# Patient Record
Sex: Female | Born: 1955
Health system: Southern US, Community
[De-identification: ages and names within clinical notes are randomized; demographics above are authoritative.]

## PROBLEM LIST (undated history)

## (undated) DIAGNOSIS — M1990 Unspecified osteoarthritis, unspecified site: Secondary | ICD-10-CM

## (undated) DIAGNOSIS — G43909 Migraine, unspecified, not intractable, without status migrainosus: Secondary | ICD-10-CM

## (undated) DIAGNOSIS — E079 Disorder of thyroid, unspecified: Secondary | ICD-10-CM

## (undated) DIAGNOSIS — T7840XA Allergy, unspecified, initial encounter: Secondary | ICD-10-CM

## (undated) DIAGNOSIS — F329 Major depressive disorder, single episode, unspecified: Secondary | ICD-10-CM

## (undated) DIAGNOSIS — F32A Depression, unspecified: Secondary | ICD-10-CM

## (undated) HISTORY — DX: Unspecified osteoarthritis, unspecified site: M19.90

## (undated) HISTORY — DX: Allergy, unspecified, initial encounter: T78.40XA

## (undated) HISTORY — DX: Disorder of thyroid, unspecified: E07.9

## (undated) HISTORY — DX: Major depressive disorder, single episode, unspecified: F32.9

## (undated) HISTORY — DX: Depression, unspecified: F32.A

---

## 2009-04-04 ENCOUNTER — Other Ambulatory Visit: Admission: RE | Admit: 2009-04-04 | Discharge: 2009-04-04 | Payer: Self-pay | Admitting: Family Medicine

## 2011-04-23 ENCOUNTER — Other Ambulatory Visit (HOSPITAL_COMMUNITY)
Admission: RE | Admit: 2011-04-23 | Discharge: 2011-04-23 | Disposition: A | Discharge status: Home / Self Care | Payer: BC Managed Care – PPO | Source: Ambulatory Visit | Attending: Family Medicine | Admitting: Family Medicine

## 2011-04-23 DIAGNOSIS — Z124 Encounter for screening for malignant neoplasm of cervix: Secondary | ICD-10-CM | POA: Insufficient documentation

## 2011-04-23 DIAGNOSIS — Z1159 Encounter for screening for other viral diseases: Secondary | ICD-10-CM | POA: Insufficient documentation

## 2013-11-02 ENCOUNTER — Telehealth: Payer: Self-pay

## 2013-11-02 ENCOUNTER — Encounter: Payer: Self-pay | Admitting: Family Medicine

## 2013-11-02 ENCOUNTER — Ambulatory Visit (INDEPENDENT_AMBULATORY_CARE_PROVIDER_SITE_OTHER): Payer: No Typology Code available for payment source | Admitting: Family Medicine

## 2013-11-02 VITALS — BP 106/70 | HR 73 | Temp 98.6°F | Resp 16 | Ht 63.5 in | Wt 167.4 lb

## 2013-11-02 DIAGNOSIS — F32A Depression, unspecified: Secondary | ICD-10-CM | POA: Insufficient documentation

## 2013-11-02 DIAGNOSIS — F3289 Other specified depressive episodes: Secondary | ICD-10-CM

## 2013-11-02 DIAGNOSIS — E039 Hypothyroidism, unspecified: Secondary | ICD-10-CM

## 2013-11-02 DIAGNOSIS — F329 Major depressive disorder, single episode, unspecified: Secondary | ICD-10-CM

## 2013-11-02 DIAGNOSIS — E7800 Pure hypercholesterolemia, unspecified: Secondary | ICD-10-CM

## 2013-11-02 LAB — COMPLETE METABOLIC PANEL WITH GFR
ALBUMIN: 4.2 g/dL (ref 3.5–5.2)
ALK PHOS: 78 U/L (ref 39–117)
ALT: 33 U/L (ref 0–35)
AST: 29 U/L (ref 0–37)
BUN: 8 mg/dL (ref 6–23)
CO2: 27 mEq/L (ref 19–32)
Calcium: 9 mg/dL (ref 8.4–10.5)
Chloride: 104 mEq/L (ref 96–112)
Creat: 0.69 mg/dL (ref 0.50–1.10)
GFR, Est African American: >89 mL/min
GFR, Est Non African American: >89 mL/min
Glucose, Bld: 109 mg/dL — ABNORMAL HIGH (ref 70–99)
POTASSIUM: 3.8 meq/L (ref 3.5–5.3)
Sodium: 139 mEq/L (ref 135–145)
Total Bilirubin: 0.4 mg/dL (ref 0.2–1.2)
Total Protein: 6.5 g/dL (ref 6.0–8.3)

## 2013-11-02 LAB — THYROID PANEL WITH TSH
FREE THYROXINE INDEX: 2.4 (ref 1.0–3.9)
T3 Uptake: 30.2 % (ref 22.5–37.0)
T4, Total: 7.8 ug/dL (ref 5.0–12.5)
TSH: 3.796 u[IU]/mL (ref 0.350–4.500)

## 2013-11-02 LAB — LDL CHOLESTEROL, DIRECT: Direct LDL: 151 mg/dL — ABNORMAL HIGH

## 2013-11-02 NOTE — Patient Instructions (Addendum)
Hypothyroidism The thyroid is a large gland located in the lower front of your neck. The thyroid gland helps control metabolism. Metabolism is how your body handles food. It controls metabolism with the hormone thyroxine. When this gland is underactive (hypothyroid), it produces too little hormone.  CAUSES These include:   Absence or destruction of thyroid tissue.  Goiter due to iodine deficiency.  Goiter due to medications.  Congenital defects (since birth).  Problems with the pituitary. This causes a lack of TSH (thyroid stimulating hormone). This hormone tells the thyroid to turn out more hormone. SYMPTOMS  Lethargy (feeling as though you have no energy)  Cold intolerance  Weight gain (in spite of normal food intake)  Dry skin  Coarse hair  Menstrual irregularity (if severe, may lead to infertility)  Slowing of thought processes Cardiac problems are also caused by insufficient amounts of thyroid hormone. Hypothyroidism in the newborn is cretinism, and is an extreme form. It is important that this form be treated adequately and immediately or it will lead rapidly to retarded physical and mental development. DIAGNOSIS  To prove hypothyroidism, your caregiver may do blood tests and ultrasound tests. Sometimes the signs are hidden. It may be necessary for your caregiver to watch this illness with blood tests either before or after diagnosis and treatment. TREATMENT  Low levels of thyroid hormone are increased by using synthetic thyroid hormone. This is a safe, effective treatment. It usually takes about four weeks to gain the full effects of the medication. After you have the full effect of the medication, it will generally take another four weeks for problems to leave. Your caregiver may start you on low doses. If you have had heart problems the dose may be gradually increased. It is generally not an emergency to get rapidly to normal. HOME CARE INSTRUCTIONS   Take your  medications as your caregiver suggests. Let your caregiver know of any medications you are taking or start taking. Your caregiver will help you with dosage schedules.  As your condition improves, your dosage needs may increase. It will be necessary to have continuing blood tests as suggested by your caregiver.  Report all suspected medication side effects to your caregiver. SEEK MEDICAL CARE IF: Seek medical care if you develop:  Sweating.  Tremulousness (tremors).  Anxiety.  Rapid weight loss.  Heat intolerance.  Emotional swings.  Diarrhea.  Weakness. SEEK IMMEDIATE MEDICAL CARE IF:  You develop chest pain, an irregular heart beat (palpitations), or a rapid heart beat. MAKE SURE YOU:   Understand these instructions.  Will watch your condition.  Will get help right away if you are not doing well or get worse. Document Released: 07/01/2005 Document Revised: 09/23/2011 Document Reviewed: 02/19/2008 [facility] Patient Information 2014 [place].    Depression, Adult Depression refers to feeling sad, low, down in the dumps, blue, gloomy, or empty. In general, there are two kinds of depression: 1. Depression that we all experience from time to time because of upsetting life experiences, including the loss of a job or the ending of a relationship (normal sadness or normal grief). This kind of depression is considered normal, is short lived, and resolves within a few days to 2 weeks. (Depression experienced after the loss of a loved one is called bereavement. Bereavement often lasts longer than 2 weeks but normally gets better with time.) 2. Clinical depression, which lasts longer than normal sadness or normal grief or interferes with your ability to function at home, at work, and in school.  It also interferes with your personal relationships. It affects almost every aspect of your life. Clinical depression is an illness. Symptoms of depression also can be caused by conditions  other than normal sadness and grief or clinical depression. Examples of these conditions are listed as follows:  Physical illness Some physical illnesses, including underactive thyroid gland (hypothyroidism), severe anemia, specific types of cancer, diabetes, uncontrolled seizures, heart and lung problems, strokes, and chronic pain are commonly associated with symptoms of depression.  Side effects of some prescription medicine In some people, certain types of prescription medicine can cause symptoms of depression.  Substance abuse Abuse of alcohol and illicit drugs can cause symptoms of depression. SYMPTOMS Symptoms of normal sadness and normal grief include the following:  Feeling sad or crying for short periods of time.  Not caring about anything (apathy).  Difficulty sleeping or sleeping too much.  No longer able to enjoy the things you used to enjoy.  Desire to be by oneself all the time (social isolation).  Lack of energy or motivation.  Difficulty concentrating or remembering.  Change in appetite or weight.  Restlessness or agitation. Symptoms of clinical depression include the same symptoms of normal sadness or normal grief and also the following symptoms:  Feeling sad or crying all the time.  Feelings of guilt or worthlessness.  Feelings of hopelessness or helplessness.  Thoughts of suicide or the desire to harm yourself (suicidal ideation).  Loss of touch with reality (psychotic symptoms). Seeing or hearing things that are not real (hallucinations) or having false beliefs about your life or the people around you (delusions and paranoia). DIAGNOSIS  The diagnosis of clinical depression usually is based on the severity and duration of the symptoms. Your caregiver also will ask you questions about your medical history and substance use to find out if physical illness, use of prescription medicine, or substance abuse is causing your depression. Your caregiver also may  order blood tests. TREATMENT  Typically, normal sadness and normal grief do not require treatment. However, sometimes antidepressant medicine is prescribed for bereavement to ease the depressive symptoms until they resolve. The treatment for clinical depression depends on the severity of your symptoms but typically includes antidepressant medicine, counseling with a mental health professional, or a combination of both. Your caregiver will help to determine what treatment is best for you. Depression caused by physical illness usually goes away with appropriate medical treatment of the illness. If prescription medicine is causing depression, talk with your caregiver about stopping the medicine, decreasing the dose, or substituting another medicine. Depression caused by abuse of alcohol or illicit drugs abuse goes away with abstinence from these substances. Some adults need professional help in order to stop drinking or using drugs. SEEK IMMEDIATE CARE IF:  You have thoughts about hurting yourself or others.  You lose touch with reality (have psychotic symptoms).  You are taking medicine for depression and have a serious side effect. FOR MORE INFORMATION National Alliance on Mental Illness: www.nami.[employer]: https://carter.com/ Document Released: 06/28/2000 Document Revised: 12/31/2011 Document Reviewed: 09/30/2011 [facility] Patient Information 2014 [place].

## 2013-11-02 NOTE — Telephone Encounter (Signed)
PT CALLED AND STATES SHE WANTS HER MEDICATIONS TO SET UP UNDER A MAIL ORDER COMPANY CALLED "[facility]" STATES THEIR PHONE NUMBER IS [phone] AND NEEDS [place] TO SEND TO THEM AN RX FOR A 3 MONTH SUPPLY FOR LEVOTHYROXINE AND PAROXETINE.  PT CAN BE REACHED AT [phone]

## 2013-11-02 NOTE — Progress Notes (Signed)
S:  This 58 y.o. Cauc female is here to establish care; change of insurer necessitates change from [facility] where she has been a pt for ~ 5 years. Last visit there was summer 2014; last CPE/PAP was about 1- 1.5 years ago. Chronic medical issues are Hypothyroidism and Depression. Pt is compliant w/ medications w/o adverse effects. She takes Levothyroxine every morning before breakfast. Paroxetine, which pt has been taking since mid-1990s, is effective.   She is divorced and has a busy life; she and her sister share a home. Pt has no children but does have a beautiful black cat.  PMHx, Surg Hx, Soc and Fam Hx reviewed.  MEDICATIONS reconciled.  Pt will be using a mail order prescription plan; she will contact us w/ that information once she has activated the plan.  ROS: As per HPI; negative for anorexia, fatigue, abnormal weight loss, vision disturbances, CP or tightness, palpitations, SOB or DOE, cough,edema, GI problems; myalgias, rashes or hair loss, HA, dizziness, weakness or syncope.  O: Filed Vitals:   11/02/13 1409  BP: 106/70  Pulse: 73  Temp: 98.6 F (37 C)  Resp: 16   GEN: In NAD: WN,WD. HENT: [place]/AT; EOMI w/ clear conj/sclerae. Wears corrective lenses. EACs/canals/TMs/oroph normal. NECK: Supple w/o LAN or TMG. C-spine NT, no muscle spasms or bony tenderness. COR: RRR; normal S1 and S2. No m/g/r. LUNGS: CTA; Normal resp rate and effort. SKIN: W&D; intact w/o diaphoresis, erythema or pallor. MS: MAEs; no deformities or abnormal tone. No c/c/e. NEURO: A&O x 3; CNs intact. DTRs- 2-3+/= and brisk. Nonfocal.  A/P: Unspecified hypothyroidism - Continue current dose of Levothyroxine pending lab results. Plan: Thyroid Panel With TSH, COMPLETE METABOLIC PANEL WITH GFR  Depressive disorder, not elsewhere classified - Continue current dose of Paroxetine. Plan: Thyroid Panel With TSH  Elevated LDL cholesterol level - Plan: LDL Cholesterol, Direct

## 2013-11-03 NOTE — Telephone Encounter (Signed)
I do not see Sheri Adkins mail order coming up on our pharmacy list.  Please find a fax number so we can send rxs.  Ok to send 90 days of levothyroxine and paroxetine

## 2013-11-03 NOTE — Telephone Encounter (Signed)
Pamala Hurry or Clarise Cruz, I'm unable to find it too. Do you mind please doing this for me in the morning. Thanks

## 2013-11-04 MED ORDER — LEVOTHYROXINE SODIUM 88 MCG PO TABS: 88.0000 ug | ORAL_TABLET | Freq: Every day | ORAL | Status: DC

## 2013-11-04 MED ORDER — PAROXETINE HCL 40 MG PO TABS: 40.0000 mg | ORAL_TABLET | Freq: Every day | ORAL | Status: DC

## 2013-11-04 NOTE — Telephone Encounter (Signed)
Called # and checked ins card, both of which clarified [facility] is the mail order that [employer] [facility] uses. Verified doses w/pt and advised I sent in Rxs to [facility].

## 2013-11-04 NOTE — Progress Notes (Signed)
Quick Note:  Please advise pt regarding following labs...  Thyroid function tests are normal.  Your recent labs show a blood sugar slightly above normal. At your next visit, it would be good to screen for Diabetes. Many people have it and are not aware of that; it is a simple in-office screening test. Your LDL "bad" cholesterol is elevated also. We will need to do a complete fasting lipid panel at your next visit. Focus on healthy food choices; eliminate processed and fried foods. Eat lean meats (remove the skin from chicken ) and fish, fruits and vegetables and whole grains. Stay active with physical activity and regular exercise.  Consider taking an Omega-3 Fish Oil supplement 1200 mg 1 capsule daily. Take this separate from other solid tablets and capsules. There are several good brands available without a prescription Petra Kuba Made is one brand).  Copy to pt. ______

## 2013-11-16 ENCOUNTER — Encounter: Payer: Self-pay | Admitting: Radiology

## 2014-02-03 ENCOUNTER — Ambulatory Visit (INDEPENDENT_AMBULATORY_CARE_PROVIDER_SITE_OTHER): Payer: No Typology Code available for payment source | Admitting: Family Medicine

## 2014-02-03 ENCOUNTER — Encounter: Payer: Self-pay | Admitting: Family Medicine

## 2014-02-03 ENCOUNTER — Ambulatory Visit (INDEPENDENT_AMBULATORY_CARE_PROVIDER_SITE_OTHER): Payer: No Typology Code available for payment source

## 2014-02-03 VITALS — BP 100/62 | HR 70 | Temp 98.2°F | Resp 16 | Ht 63.75 in | Wt 163.4 lb

## 2014-02-03 DIAGNOSIS — M62838 Other muscle spasm: Secondary | ICD-10-CM

## 2014-02-03 DIAGNOSIS — R51 Headache: Secondary | ICD-10-CM

## 2014-02-03 DIAGNOSIS — R519 Headache, unspecified: Secondary | ICD-10-CM

## 2014-02-03 MED ORDER — MELOXICAM 15 MG PO TABS: ORAL_TABLET | ORAL | Status: DC

## 2014-02-03 MED ORDER — GABAPENTIN 100 MG PO CAPS: 100.0000 mg | ORAL_CAPSULE | Freq: Three times a day (TID) | ORAL | Status: DC

## 2014-02-03 NOTE — Progress Notes (Signed)
Subjective:    Patient ID: Sheri Adkins, female    DOB: 09/25/1955, 58 y.o.   MRN: [id number]  HPI  This 58 y.o. Cauc female present w/ acute onset of R facial pain, described as "hot poker", burning or "like when dental anesthetic is used". She has not dental pain or pressure. Mild sore throat w/o dysphagia, ear pain or tinnitus, eye pain or tearing, vision disturbance, fever/chills or n/v/d. Pt reports stiffness in neck associated w/ prolonged work days on the computer. She c/o R-sided neck discomfort radiating down R arm w/ paresthesias.  Massage therapy last week relieved much of the pain. Intermittent facial discomfort only occurs when sitting upright; duration- 5 minutes or less.   Patient Active Problem List   Diagnosis Date Noted  . Unspecified hypothyroidism 11/02/2013  . Depressive disorder, not elsewhere classified 11/02/2013  . Elevated LDL cholesterol level 11/02/2013    Prior to Admission medications   Medication Sig Start Date End Date Taking? Authorizing Provider  Calcium Carbonate-Vitamin D (CALCIUM + D PO) Take by mouth daily.   Yes Historical Provider, MD  levothyroxine (SYNTHROID, LEVOTHROID) 88 MCG tablet Take 1 tablet (88 mcg total) by mouth daily. 11/04/13  Yes Barton Fanny, MD  Multiple Vitamin (MULTIVITAMIN) tablet Take 1 tablet by mouth daily.   Yes Historical Provider, MD  PARoxetine (PAXIL) 40 MG tablet Take 1 tablet (40 mg total) by mouth daily. 11/04/13  Yes Barton Fanny, MD  gabapentin (NEURONTIN) 100 MG capsule Take 1 capsule (100 mg total) by mouth 3 (three) times daily. 02/03/14   Barton Fanny, MD  meloxicam [facility]) 15 MG tablet Take 1 tablet once a day with largest meal. 02/03/14   Barton Fanny, MD   Allergies  Allergen Reactions  . Penicillins     Rash  . Sulfa Antibiotics     Rash    PMHx, Surg Hx, Soc and Fam HXx reviewed.   Review of Systems As per HPI.     Objective:   Physical Exam  Nursing note and vitals  reviewed. Constitutional: She is oriented to person, place, and time. Vital signs are normal. She appears well-developed and well-nourished. No distress.  HENT:  Head: Normocephalic and atraumatic.  Right Ear: Hearing, tympanic membrane, external ear and ear canal normal.  Left Ear: Hearing, tympanic membrane, external ear and ear canal normal.  Nose: Nose normal. No nasal deformity or septal deviation.  Mouth/Throat: Uvula is midline and mucous membranes are normal. No trismus in the jaw. Normal dentition. No dental abscesses, uvula swelling or dental caries. Posterior oropharyngeal erythema present. No oropharyngeal exudate or posterior oropharyngeal edema.  Eyes: Conjunctivae, EOM and lids are normal. Pupils are equal, round, and reactive to light. No scleral icterus.  Neck: Trachea normal. Neck supple. No JVD present. Muscular tenderness present. No spinous process tenderness present. Carotid bruit is not present. Decreased range of motion present. No mass and no thyromegaly present.  Cardiovascular: Normal rate, regular rhythm, S1 normal and S2 normal.   No extrasystoles are present. PMI is displaced.  Exam reveals no gallop and no friction rub.   No murmur heard. Pulmonary/Chest: Effort normal and breath sounds normal. No respiratory distress.  Musculoskeletal:       Cervical back: She exhibits decreased range of motion, tenderness and spasm. She exhibits no bony tenderness, no edema, no deformity and no pain.  Lymphadenopathy:       Head (right side): No submental, no submandibular, no tonsillar, no preauricular, no posterior  auricular and no occipital adenopathy present.       Head (left side): No submental, no submandibular, no tonsillar, no preauricular, no posterior auricular and no occipital adenopathy present.    She has no cervical adenopathy.       Right: No supraclavicular adenopathy present.       Left: No supraclavicular adenopathy present.  Neurological: She is alert and  oriented to person, place, and time. She has normal strength. She displays no atrophy and no tremor. No cranial nerve deficit or sensory deficit. She exhibits normal muscle tone. Coordination and gait normal.  Reflex Scores:      Tricep reflexes are 2+ on the right side and 2+ on the left side.      Bicep reflexes are 2+ on the right side and 2+ on the left side.      Brachioradialis reflexes are 2+ on the right side and 2+ on the left side.      Patellar reflexes are 2+ on the right side and 2+ on the left side. Skin: Skin is warm, dry and intact. No lesion and no rash noted. She is not diaphoretic. No cyanosis or erythema. No pallor.  Psychiatric: She has a normal mood and affect. Her speech is normal and behavior is normal. Judgment and thought content normal. Cognition and memory are normal.     [facility] reading (PRIMARY) by  Dr. Leward Quan:  Cervical spine- mild early degenerative changes at lower levels w/ narrowed disc spaces. No fracture or dislocation.     Assessment & Plan:  Right facial pain - Neuralgia; trial gabapentin 100 mg- start w/ 1 capsule at bedtime for 1 week then increase to 1 capsule twice daily until follow-up. Plan: DG Cervical Spine 2 or 3 views  Muscle spasms of head or neck - Trial Meloxicam 15 mg 1 tablet daily. Plan: DG Cervical Spine 2 or 3 views

## 2014-02-03 NOTE — Patient Instructions (Signed)
Muscle Cramps and Spasms Muscle cramps and spasms occur when a muscle or muscles tighten and you have no control over this tightening (involuntary muscle contraction). They are a common problem and can develop in any muscle. The most common place is in the calf muscles of the leg. Both muscle cramps and muscle spasms are involuntary muscle contractions, but they also have differences:   Muscle cramps are sporadic and painful. They may last a few seconds to a quarter of an hour. Muscle cramps are often more forceful and last longer than muscle spasms.  Muscle spasms may or may not be painful. They may also last just a few seconds or much longer. CAUSES  It is uncommon for cramps or spasms to be due to a serious underlying problem. In many cases, the cause of cramps or spasms is unknown. Some common causes are:   Overexertion.   Overuse from repetitive motions (doing the same thing over and over).   Remaining in a certain position for a long period of time.   Improper preparation, form, or technique while performing a sport or activity.   Dehydration.   Injury.   Side effects of some medicines.   Abnormally low levels of the salts and ions in your blood (electrolytes), especially potassium and calcium. This could happen if you are taking water pills (diuretics) or you are pregnant.  Some underlying medical problems can make it more likely to develop cramps or spasms. These include, but are not limited to:   Diabetes.   Parkinson disease.   Hormone disorders, such as thyroid problems.   Alcohol abuse.   Diseases specific to muscles, joints, and bones.   Blood vessel disease where not enough blood is getting to the muscles.  HOME CARE INSTRUCTIONS   Stay well hydrated. Drink enough water and fluids to keep your urine clear or pale yellow.  It may be helpful to massage, stretch, and relax the affected muscle.  For tight or tense muscles, use a warm towel, heating  pad, or hot shower water directed to the affected area.  If you are sore or have pain after a cramp or spasm, applying ice to the affected area may relieve discomfort.  Put ice in a plastic bag.  Place a towel between your skin and the bag.  Leave the ice on for 15-20 minutes, 03-04 times a day.  Medicines used to treat a known cause of cramps or spasms may help reduce their frequency or severity. Only take over-the-counter or prescription medicines as directed by your caregiver. SEEK MEDICAL CARE IF:  Your cramps or spasms get more severe, more frequent, or do not improve over time.  MAKE SURE YOU:   Understand these instructions.  Will watch your condition.  Will get help right away if you are not doing well or get worse. Document Released: 12/21/2001 Document Revised: 10/26/2012 Document Reviewed: 06/17/2012 [facility] Patient Information 2015 [place], [place]. This information is not intended to replace advice given to you by your health care provider. Make sure you discuss any questions you have with your health care provider.   Your can apply moist heat to the back of your neck and to side of face to see if that helps relief discomfort.  Also, try topical muscle relaxant (like ARNICA CREAM) which you can purchase over-the-counter.

## 2014-03-08 ENCOUNTER — Ambulatory Visit: Payer: No Typology Code available for payment source | Admitting: Family Medicine

## 2014-03-11 ENCOUNTER — Telehealth: Payer: Self-pay

## 2014-03-11 ENCOUNTER — Encounter: Payer: Self-pay | Admitting: Family Medicine

## 2014-03-11 ENCOUNTER — Ambulatory Visit (INDEPENDENT_AMBULATORY_CARE_PROVIDER_SITE_OTHER): Payer: No Typology Code available for payment source | Admitting: Family Medicine

## 2014-03-11 VITALS — BP 108/69 | HR 84 | Temp 98.3°F | Resp 16 | Ht 64.0 in | Wt 166.0 lb

## 2014-03-11 DIAGNOSIS — M47812 Spondylosis without myelopathy or radiculopathy, cervical region: Secondary | ICD-10-CM | POA: Insufficient documentation

## 2014-03-11 DIAGNOSIS — G43809 Other migraine, not intractable, without status migrainosus: Secondary | ICD-10-CM | POA: Insufficient documentation

## 2014-03-11 MED ORDER — ALMOTRIPTAN MALATE 6.25 MG PO TABS: 6.2500 mg | ORAL_TABLET | ORAL | Status: DC | PRN

## 2014-03-11 MED ORDER — RIZATRIPTAN BENZOATE 10 MG PO TBDP: 10.0000 mg | ORAL_TABLET | ORAL | Status: DC | PRN

## 2014-03-11 NOTE — Telephone Encounter (Signed)
I have prescribed Axert (generic) which appears to be covered by insurer. Pt should let me know if she has any problems with this medication ( ineffective or adverse effects).

## 2014-03-11 NOTE — Telephone Encounter (Deleted)
I have decided against prescribing medication for migraine (Axert is on the formulary) but use with Paroxetine could be a problem.  I am prescribing Fioricet which pt can use if she has a migraine. We will need to revisit this topic at her next visit. She has been advised to increase Gabapentin bedtime dose to 2 capsules.

## 2014-03-11 NOTE — Telephone Encounter (Signed)
[place] - Pt was seen this morning and she has just received a call from [employer] that that do not carry the rizatripton.  Can we call her in something different? [phone]

## 2014-03-11 NOTE — Patient Instructions (Signed)
Cervical spondylosis- Continue taking Gabapentin; take 1 capsule in the morning and increase bedtime dose to 2 capsules. If you have any adverse medication reaction with the increased bedtime dose, then go back to 1 capsule at bedtime. After you have established your [facility] practice, we can consider discontinuing the Gabapentin. We discussed alcohol use (wine) while using this medication. Continue taking Meloxicam as directed. Moist heat applied to neck area can be helpful also.  I have prescribed Maxalt- generic- for your infrequent migraines. Try to avoid migraine triggers.   Degenerative Disk Disease Degenerative disk disease is a condition caused by the changes that occur in the cushions of the backbone (spinal disks) as you grow older. Spinal disks are soft and compressible disks located between the bones of the spine (vertebrae). They act like shock absorbers. Degenerative disk disease can affect the whole spine. However, the neck and lower back are most commonly affected. Many changes can occur in the spinal disks with aging, such as:  The spinal disks may dry and shrink.  Small tears may occur in the tough, outer covering of the disk (annulus).  The disk space may become smaller due to loss of water.  Abnormal growths in the bone (spurs) may occur. This can put pressure on the nerve roots exiting the spinal canal, causing pain.  The spinal canal may become narrowed. CAUSES  Degenerative disk disease is a condition caused by the changes that occur in the spinal disks with aging. The exact cause is not known, but there is a genetic basis for many patients. Degenerative changes can occur due to loss of fluid in the disk. This makes the disk thinner and reduces the space between the backbones. Small cracks can develop in the outer layer of the disk. This can lead to the breakdown of the disk. You are more likely to get degenerative disk disease if you are overweight. Smoking cigarettes and doing  heavy work such as weightlifting can also increase your risk of this condition. Degenerative changes can start after a sudden injury. Growth of bone spurs can compress the nerve roots and cause pain.  SYMPTOMS  The symptoms vary from person to person. Some people may have no pain, while others have severe pain. The pain may be so severe that it can limit your activities. The location of the pain depends on the part of your backbone that is affected. You will have neck or arm pain if a disk in the neck area is affected. You will have pain in your back, buttocks, or legs if a disk in the lower back is affected. The pain becomes worse while bending, reaching up, or with twisting movements. The pain may start gradually and then get worse as time passes. It may also start after a major or minor injury. You may feel numbness or tingling in the arms or legs.  DIAGNOSIS  Your caregiver will ask you about your symptoms and about activities or habits that may cause the pain. He or she may also ask about any injuries, diseases, or treatments you have had earlier. Your caregiver will examine you to check for the range of movement that is possible in the affected area, to check for strength in your extremities, and to check for sensation in the areas of the arms and legs supplied by different nerve roots. An X-ray of the spine may be taken. Your caregiver may suggest other imaging tests, such as magnetic resonance imaging (MRI), if needed.  TREATMENT  Treatment includes  rest, modifying your activities, and applying ice and heat. Your caregiver may prescribe medicines to reduce your pain and may ask you to do some exercises to strengthen your back. In some cases, you may need surgery. You and your caregiver will decide on the treatment that is best for you. HOME CARE INSTRUCTIONS   Follow proper lifting and walking techniques as advised by your caregiver.  Maintain good posture.  Exercise regularly as  advised.  Perform relaxation exercises.  Change your sitting, standing, and sleeping habits as advised. Change positions frequently.  Lose weight as advised.  Stop smoking if you smoke.  Wear supportive footwear. SEEK MEDICAL CARE IF:  Your pain does not go away within 1 to 4 weeks. SEEK IMMEDIATE MEDICAL CARE IF:   Your pain is severe.  You notice weakness in your arms, hands, or legs.  You begin to lose control of your bladder or bowel movements. MAKE SURE YOU:   Understand these instructions.  Will watch your condition.  Will get help right away if you are not doing well or get worse. Document Released: 04/28/2007 Document Revised: 09/23/2011 Document Reviewed: 11/02/2013 [facility] Patient Information 2015 [place], [place]. This information is not intended to replace advice given to you by your health care provider. Make sure you discuss any questions you have with your health care provider.

## 2014-03-13 ENCOUNTER — Encounter: Payer: Self-pay | Admitting: Family Medicine

## 2014-03-13 NOTE — Progress Notes (Signed)
Subjective:    Patient ID: Sheri Adkins, female    DOB: [birth year], 58 y.o.   MRN: [id number]  HPI  This 58 y.o. Cauc female returns for re-evaluation of R-sided facial pain. She has less pain since starting Gabapentin 1 capsule bid and Meloxicam. Facial pain infrequently awakens pt from sleep but she reports mild sleep disorder unassociated w/ current problem. No aadverse medication effects w/ Gabapentin. She reports migraine HA once she started taking Meloxicam. Migraine started in back of neck but progressed to R side of head; associated symptoms included nausea and mild vision disturbance. HA was relieved w/ OTC medication and rest. Medication prescribed in the past for migraine included Maxalt which was effective.   Patient Active Problem List   Diagnosis Date Noted  . Cervical spondylosis 03/11/2014  . Migraine variants, not intractable 03/11/2014  . Unspecified hypothyroidism 11/02/2013  . Depressive disorder, not elsewhere classified 11/02/2013  . Elevated LDL cholesterol level 11/02/2013   Prior to Admission medications   Medication Sig Start Date End Date Taking? Authorizing Provider  Calcium Carbonate-Vitamin D (CALCIUM + D PO) Take by mouth daily.   Yes Historical Provider, MD  gabapentin (NEURONTIN) 100 MG capsule Take 1 capsule (100 mg total) by mouth 3 (three) times daily. 02/03/14  Yes Barton Fanny, MD  levothyroxine (SYNTHROID, LEVOTHROID) 88 MCG tablet Take 1 tablet (88 mcg total) by mouth daily. 11/04/13  Yes Barton Fanny, MD  meloxicam [facility]) 15 MG tablet Take 1 tablet once a day with largest meal. 02/03/14  Yes Barton Fanny, MD  Multiple Vitamin (MULTIVITAMIN) tablet Take 1 tablet by mouth daily.   Yes Historical Provider, MD  PARoxetine (PAXIL) 40 MG tablet Take 1 tablet (40 mg total) by mouth daily. 11/04/13  Yes Barton Fanny, MD    History   Social History  . Marital Status: Single    Spouse Name: N/A    Number of Children: N/A  .  Years of Education: N/A   Occupational History  . Not on file.   Social History Main Topics  . Smoking status: Never Smoker   . Smokeless tobacco: Not on file  . Alcohol Use: Yes     Comment: 2 a week  . Drug Use: No  . Sexual Activity: Not on file   Other Topics Concern  . Not on file   Social History Narrative  . No narrative on file    Family History  Problem Relation Age of Onset  . Hypertension Mother   . Stroke Mother   . Cancer Sister     ovarian cancer  . Cancer Paternal Grandmother     breast  . Heart disease Paternal Grandfather     Review of Systems  Constitutional: Negative.   Eyes: Negative.   Respiratory: Negative for chest tightness and shortness of breath.   Cardiovascular: Negative for chest pain and palpitations.  Endocrine: Negative.   Musculoskeletal: Negative.   Skin: Negative.   Neurological: Positive for headaches. Negative for dizziness, tremors, seizures, syncope, facial asymmetry, speech difficulty and numbness.  Psychiatric/Behavioral: Positive for sleep disturbance.      Objective:   Physical Exam  Nursing note and vitals reviewed. Constitutional: She is oriented to person, place, and time. Vital signs are normal. She appears well-developed and well-nourished. No distress.  HENT:  Head: Normocephalic and atraumatic.  Right Ear: Hearing and external ear normal.  Left Ear: Hearing and external ear normal.  Nose: Nose normal. No nasal deformity or septal  deviation.  Mouth/Throat: Uvula is midline and mucous membranes are normal.  Eyes: Conjunctivae, EOM and lids are normal. No scleral icterus.  Neck: Full passive range of motion without pain. Neck supple. Muscular tenderness present. No spinous process tenderness present. No mass and no thyromegaly present.  Cardiovascular: Normal rate and regular rhythm.   Pulmonary/Chest: Effort normal. No respiratory distress.  Musculoskeletal: She exhibits no edema.       Cervical back: She  exhibits decreased range of motion, tenderness and spasm. She exhibits no bony tenderness, no swelling, no deformity and no pain.  Neurological: She is alert and oriented to person, place, and time. She has normal strength. No cranial nerve deficit or sensory deficit. Coordination and gait normal.  Skin: Skin is warm and dry. She is not diaphoretic. No erythema. No pallor.  Psychiatric: She has a normal mood and affect. Her behavior is normal. Thought content normal.    I reviewed C-spine plain films (02/03/2014) with pt; degenerative disc disease at C5-6 and C6-7 clearly visible.     Assessment & Plan:  Cervical spondylosis- Continue Gabapentin but increase bedtime dose to 2 capsules. Advised to replace pillows if she has old pillows that are not providing adequate head/nack support. She also states she needs to replace her mattress.   Migraine variants, not intractable- RX: Axert 6.25 mg  Use as directed.

## 2014-05-04 ENCOUNTER — Encounter: Payer: No Typology Code available for payment source | Admitting: Family Medicine

## 2014-05-12 ENCOUNTER — Encounter: Payer: Self-pay | Admitting: Family Medicine

## 2014-05-12 ENCOUNTER — Ambulatory Visit (INDEPENDENT_AMBULATORY_CARE_PROVIDER_SITE_OTHER): Payer: No Typology Code available for payment source | Admitting: Family Medicine

## 2014-05-12 VITALS — BP 108/60 | HR 71 | Temp 98.8°F | Resp 16 | Ht 64.0 in | Wt 166.2 lb

## 2014-05-12 DIAGNOSIS — M62838 Other muscle spasm: Secondary | ICD-10-CM

## 2014-05-12 DIAGNOSIS — M6248 Contracture of muscle, other site: Secondary | ICD-10-CM

## 2014-05-12 DIAGNOSIS — E7800 Pure hypercholesterolemia, unspecified: Secondary | ICD-10-CM

## 2014-05-12 DIAGNOSIS — F32A Depression, unspecified: Secondary | ICD-10-CM

## 2014-05-12 DIAGNOSIS — F329 Major depressive disorder, single episode, unspecified: Secondary | ICD-10-CM

## 2014-05-12 DIAGNOSIS — E039 Hypothyroidism, unspecified: Secondary | ICD-10-CM

## 2014-05-12 DIAGNOSIS — G43809 Other migraine, not intractable, without status migrainosus: Secondary | ICD-10-CM

## 2014-05-12 DIAGNOSIS — M47812 Spondylosis without myelopathy or radiculopathy, cervical region: Secondary | ICD-10-CM

## 2014-05-12 LAB — COMPLETE METABOLIC PANEL WITH GFR
ALK PHOS: 61 U/L (ref 39–117)
ALT: 22 U/L (ref 0–35)
AST: 24 U/L (ref 0–37)
Albumin: 4.3 g/dL (ref 3.5–5.2)
BUN: 9 mg/dL (ref 6–23)
CO2: 24 meq/L (ref 19–32)
CREATININE: 0.76 mg/dL (ref 0.50–1.10)
Calcium: 8.7 mg/dL (ref 8.4–10.5)
Chloride: 105 mEq/L (ref 96–112)
GFR, Est Non African American: 87 mL/min
GLUCOSE: 84 mg/dL (ref 70–99)
Potassium: 3.9 mEq/L (ref 3.5–5.3)
Sodium: 137 mEq/L (ref 135–145)
Total Bilirubin: 0.7 mg/dL (ref 0.2–1.2)
Total Protein: 6.6 g/dL (ref 6.0–8.3)

## 2014-05-12 LAB — CBC WITH DIFFERENTIAL/PLATELET
Basophils Absolute: 0 10*3/uL (ref 0.0–0.1)
Basophils Relative: 1 % (ref 0–1)
EOS PCT: 5 % (ref 0–5)
Eosinophils Absolute: 0.2 10*3/uL (ref 0.0–0.7)
HCT: 35.8 % — ABNORMAL LOW (ref 36.0–46.0)
Hemoglobin: 12.4 g/dL (ref 12.0–15.0)
Lymphocytes Relative: 41 % (ref 12–46)
Lymphs Abs: 1.8 10*3/uL (ref 0.7–4.0)
MCH: 30.9 pg (ref 26.0–34.0)
MCHC: 34.6 g/dL (ref 30.0–36.0)
MCV: 89.3 fL (ref 78.0–100.0)
MONOS PCT: 10 % (ref 3–12)
Monocytes Absolute: 0.4 10*3/uL (ref 0.1–1.0)
Neutro Abs: 1.8 10*3/uL (ref 1.7–7.7)
Neutrophils Relative %: 43 % (ref 43–77)
PLATELETS: 237 10*3/uL (ref 150–400)
RBC: 4.01 MIL/uL (ref 3.87–5.11)
RDW: 13.4 % (ref 11.5–15.5)
WBC: 4.3 10*3/uL (ref 4.0–10.5)

## 2014-05-12 LAB — LIPID PANEL
CHOLESTEROL: 233 mg/dL — AB (ref 0–200)
HDL: 59 mg/dL (ref >39)
LDL Cholesterol: 150 mg/dL — ABNORMAL HIGH (ref 0–99)
TRIGLYCERIDES: 121 mg/dL (ref <150)
Total CHOL/HDL Ratio: 3.9 Ratio
VLDL: 24 mg/dL (ref 0–40)

## 2014-05-12 LAB — TSH: TSH: 3.492 u[IU]/mL (ref 0.350–4.500)

## 2014-05-12 MED ORDER — PAROXETINE HCL 40 MG PO TABS: 40.0000 mg | ORAL_TABLET | Freq: Every day | ORAL | Status: DC

## 2014-05-12 MED ORDER — ALMOTRIPTAN MALATE 6.25 MG PO TABS: 6.2500 mg | ORAL_TABLET | ORAL | Status: DC | PRN

## 2014-05-12 MED ORDER — MELOXICAM 15 MG PO TABS: ORAL_TABLET | ORAL | Status: DC

## 2014-05-12 MED ORDER — LEVOTHYROXINE SODIUM 88 MCG PO TABS: 88.0000 ug | ORAL_TABLET | Freq: Every day | ORAL | Status: DC

## 2014-05-12 MED ORDER — GABAPENTIN 100 MG PO CAPS: 100.0000 mg | ORAL_CAPSULE | Freq: Three times a day (TID) | ORAL | Status: DC

## 2014-05-12 NOTE — Progress Notes (Signed)
Subjective:    Patient ID: Sheri Adkins, female    DOB: [birth year], 58 y.o.   MRN: [id number]  HPI  Mrs. Esperanza is a 58 y.o. female with a pmh of migraine and cervical spondylosis.   Migraines - reports that she rarely gets them now, used to be much more frequent, now only about twice a year. Patient experiences aura, n/v, tries to use ibuprofen OTC, which helps but otherwise she will try to sleep migraines off. Of note, patient has not been able to fill her prescription through mail order, she will be switching to [facility] in November. Would like to have a refill of her almotriptan.   Hypothyroidism - managed with Synthroid, reports compliance. Denies adverse effects. Denies weight gain, constipation, dry skin, hair thinning, depressed mood.   Depression - managed with Paxil, started this medication 4 years ago, reports compliance. Denies adverse effects. Works full time with [employer], job can be demanding but she feels she copes well and welcomes staying busy with her job. Denies depressed mood, decreased energy, concentration or appetite, sleep disturbances, psychomotor agitation or retardation, guilt, SI or HI. Denies smoking, has 1 drink of alcohol at dinner.  Cervical spondylosis - managed with meloxicam and gabapentin, denies adverse effects. Patient used to experience severe spasms in neck causing pain and facial twitching, tingling. She reports that this is completely resolved. At times, her work can make her a little sore but she does neck exercises, heat pads with significant relief. She also does yoga regularly and together with gabapentin feels that this is resolved for her.  Denies any other aggravating or relieving factors, no other questions or concerns.   Prior to Admission medications   Medication Sig Start Date End Date Taking? Authorizing Provider  Calcium Carbonate-Vitamin D (CALCIUM + D PO) Take by mouth daily.   Yes Historical Provider, MD  gabapentin (NEURONTIN)  100 MG capsule Take 1 capsule (100 mg total) by mouth 3 (three) times daily. 05/12/14  Yes Jaynee Eagles, PA-C  levothyroxine (SYNTHROID, LEVOTHROID) 88 MCG tablet Take 1 tablet (88 mcg total) by mouth daily. 05/12/14  Yes Jaynee Eagles, PA-C  meloxicam (MOBIC) 15 MG tablet Take 1 tablet once a day with largest meal. 05/12/14  Yes Jaynee Eagles, PA-C  Multiple Vitamin (MULTIVITAMIN) tablet Take 1 tablet by mouth daily.   Yes Historical Provider, MD  PARoxetine (PAXIL) 40 MG tablet Take 1 tablet (40 mg total) by mouth daily. 05/12/14  Yes Jaynee Eagles, PA-C  almotriptan (AXERT) 6.25 MG tablet Take 1 tablet (6.25 mg total) by mouth as needed for migraine. may repeat in 2 hours if needed 05/12/14   Barton Fanny, MD    Allergies  Allergen Reactions  . Penicillins     Rash  . Sulfa Antibiotics     Rash    Past Medical History  Diagnosis Date  . Depression   . Thyroid disease     History reviewed. No pertinent past surgical history.   Family History  Problem Relation Age of Onset  . Hypertension Mother   . Stroke Mother   . Cancer Sister     ovarian cancer  . Cancer Paternal Grandmother     breast  . Heart disease Paternal Grandfather     Review of Systems As in subjective.    Objective:   Physical Exam  Constitutional: She is oriented to person, place, and time. She appears well-developed and well-nourished. No distress.  BP 108/60  Pulse 71  Temp(Src)  98.8 F (37.1 C) (Oral)  Resp 16  Ht 5\' 4"  (1.626 m)  Wt 166 lb 3.2 oz (75.388 kg)  BMI 28.51 kg/m2  SpO2 95%   HENT:  Head: Normocephalic and atraumatic.  Neck: Normal range of motion. Neck supple. No thyromegaly present.  Cardiovascular: Normal rate, regular rhythm, normal heart sounds and intact distal pulses.  Exam reveals no gallop and no friction rub.   No murmur heard. Pulmonary/Chest: Effort normal and breath sounds normal. No respiratory distress. She has no wheezes.  Abdominal: Soft. Bowel sounds are normal.  She exhibits no mass. There is no tenderness.  Musculoskeletal: Normal range of motion. She exhibits no edema and no tenderness.  Lymphadenopathy:    She has no cervical adenopathy.  Neurological: She is alert and oriented to person, place, and time. She has normal reflexes.  Skin: Skin is warm and dry. No rash noted. She is not diaphoretic. No erythema.  Psychiatric: She has a normal mood and affect. Her behavior is normal.      Assessment & Plan:   1. Migraine variants, not intractable Stable, refill almotriptan, follow up as needed - COMPLETE METABOLIC PANEL WITH GFR  2. Cervical spondylosis 3. Muscle spasms of head or neck Stable, continue gabapentin and meloxicam, follow up as needed  4. Hypothyroidism, unspecified hypothyroidism type Stable, continue synthroid, repeat TSH today, follow up in 07/2014 - CBC with Differential - COMPLETE METABOLIC PANEL WITH GFR - TSH  5. Depression Stable, continue Paxil, labs today, follow up in 07/2014 - TSH  6. Elevated LDL cholesterol level Stable, recheck lipids today, advised healthy diet and continue exercise, follow up as above - Lipid panel   Jaynee Eagles, PA-C [facility] [phone] 05/12/2014 9:55 AM

## 2014-05-12 NOTE — Patient Instructions (Addendum)
It was good to see you today and good to know that you are doing well. I will see you in January 2016 for your complete physical. Your lab results will be available within a few days.  Below is some information about MEDITERRANEAN DIET that is a great guide for healthy lifestyle.      Mediterranean Diet  Why follow it? Research shows.   Those who follow the Mediterranean diet have a reduced risk of heart disease    The diet is associated with a reduced incidence of Parkinson's and Alzheimer's diseases   People following the diet may have longer life expectancies and lower rates of chronic diseases    The Dietary Guidelines for Americans recommends the Mediterranean diet as an eating plan to promote health and prevent disease  What Is the Mediterranean Diet?    Healthy eating plan based on typical foods and recipes of Mediterranean-style cooking   The diet is primarily a plant based diet; these foods should make up a majority of meals   Starches - Plant based foods should make up a majority of meals - They are an important sources of vitamins, minerals, energy, antioxidants, and fiber - Choose whole grains, foods high in fiber and minimally processed items  - Typical grain sources include wheat, oats, barley, corn, brown rice, bulgar, farro, millet, polenta, couscous  - Various types of beans include chickpeas, lentils, fava beans, black beans, white beans   Fruits  Veggies - Large quantities of antioxidant rich fruits & veggies; 6 or more servings  - Vegetables can be eaten raw or lightly drizzled with oil and cooked  - Vegetables common to the traditional Mediterranean Diet include: artichokes, arugula, beets, broccoli, brussel sprouts, cabbage, carrots, celery, collard greens, cucumbers, eggplant, kale, leeks, lemons, lettuce, mushrooms, okra, onions, peas, peppers, potatoes, pumpkin, radishes, rutabaga, shallots, spinach, sweet potatoes, turnips, zucchini - Fruits common to the  Mediterranean Diet include: apples, apricots, avocados, cherries, clementines, dates, figs, grapefruits, grapes, melons, nectarines, oranges, peaches, pears, pomegranates, strawberries, tangerines  Fats - Replace butter and margarine with healthy oils, such as olive oil, canola oil, and tahini  - Limit nuts to no more than a handful a day  - Nuts include walnuts, almonds, pecans, pistachios, pine nuts  - Limit or avoid candied, honey roasted or heavily salted nuts - Olives are central to the [employer] - can be eaten whole or used in a variety of dishes   Meats Protein - Limiting red meat: no more than a few times a month - When eating red meat: choose lean cuts and keep the portion to the size of deck of cards - Eggs: approx. 0 to 4 times a week  - Fish and lean poultry: at least 2 a week  - Healthy protein sources include, chicken, [place], lean beef, lamb - Increase intake of seafood such as tuna, salmon, trout, mackerel, shrimp, scallops - Avoid or limit high fat processed meats such as sausage and bacon  Dairy - Include moderate amounts of low fat dairy products  - Focus on healthy dairy such as fat free yogurt, skim milk, low or reduced fat cheese - Limit dairy products higher in fat such as whole or 2% milk, cheese, ice cream  Alcohol - Moderate amounts of red wine is ok  - No more than 5 oz daily for women (all ages) and men older than age 26  - No more than 10 oz of wine daily for men younger than 6  Other - Limit sweets and other desserts  - Use herbs and spices instead of salt to flavor foods  - Herbs and spices common to the traditional Mediterranean Diet include: basil, bay leaves, chives, cloves, cumin, fennel, garlic, lavender, marjoram, mint, oregano, parsley, pepper, rosemary, sage, savory, sumac, tarragon, thyme   It's not just a diet, it's a lifestyle:    The Mediterranean diet includes lifestyle factors typical of those in the region    Foods, drinks and meals are  best eaten with others and savored   Daily physical activity is important for overall good health   This could be strenuous exercise like running and aerobics   This could also be more leisurely activities such as walking, housework, yard-work, or taking the stairs   Moderation is the key; a balanced and healthy diet accommodates most foods and drinks   Consider portion sizes and frequency of consumption of certain foods   Meal Ideas & Options:    Breakfast:  o Whole wheat toast or whole wheat English muffins with peanut butter & hard boiled egg o Steel cut oats topped with apples & cinnamon and skim milk  o Fresh fruit: banana, strawberries, melon, berries, peaches  o Smoothies: strawberries, bananas, greek yogurt, peanut butter o Low fat greek yogurt with blueberries and granola  o Egg white omelet with spinach and mushrooms o Breakfast couscous: whole wheat couscous, apricots, skim milk, cranberries    Sandwiches:  o Hummus and grilled vegetables (peppers, zucchini, squash) on whole wheat bread   o Grilled chicken on whole wheat pita with lettuce, tomatoes, cucumbers or tzatziki  o Tuna salad on whole wheat bread: tuna salad made with greek yogurt, olives, red peppers, capers, green onions o Garlic rosemary lamb pita: lamb sauted with garlic, rosemary, salt & pepper; add lettuce, cucumber, greek yogurt to pita - flavor with lemon juice and black pepper    Seafood:  o Mediterranean grilled salmon, seasoned with garlic, basil, parsley, lemon juice and black pepper o Shrimp, lemon, and spinach whole-grain pasta salad made with low fat greek yogurt  o Seared scallops with lemon orzo  o Seared tuna steaks seasoned salt, pepper, coriander topped with tomato mixture of olives, tomatoes, olive oil, minced garlic, parsley, green onions and cappers    Meats:  o Herbed greek chicken salad with kalamata olives, cucumber, feta  o Red bell peppers stuffed with spinach, bulgur, lean ground beef (or  lentils) & topped with feta   o Kebabs: skewers of chicken, tomatoes, onions, zucchini, squash  o [place] burgers: made with red onions, mint, dill, lemon juice, feta cheese topped with roasted red peppers   Vegetarian o Cucumber salad: cucumbers, artichoke hearts, celery, red onion, feta cheese, tossed in olive oil & lemon juice  o Hummus and whole grain pita points with a greek salad (lettuce, tomato, feta, olives, cucumbers, red onion) o Lentil soup with celery, carrots made with vegetable broth, garlic, salt and pepper  o Tabouli salad: parsley, bulgur, mint, scallions, cucumbers, tomato, radishes, lemon juice, olive oil, salt and pepper. o

## 2014-05-12 NOTE — Progress Notes (Signed)
This 58 y.o. Cauc female returns for follow-up and for medication refills. She has no complaints and reports no adverse medication problems.  I have reviewed notes and physical exam with Jaynee Eagles, PA-C and agree with documentation. Objective: Neuro exam remarkable for DTRs 2-3+/4 in upper and lower ext. Otherwise  Exam normal.  Nothing to add.  Agree with A/P as outlined by PA-C. Pt given printed prescription for Axert, taken rarely for treatment of migraines.

## 2014-05-18 NOTE — Progress Notes (Signed)
Quick Note:  Please advise pt regarding following labs...  You are taking correct dose of thyroid medication. Blood counts are normal. Sodium, potassium, chloride, calcium, kidney and liver function tests are all normal. All labs are normal except total and LDL ("bad") cholesterol are elevated. Fortunately, your HDL ("good") cholesterol is high. Work on better nutrition and following the MEDITERRANEAN DIET guidelines that you received at your last office visit.  Contact the clinic if you have questions or concerns. ______

## 2014-05-24 ENCOUNTER — Encounter: Payer: Self-pay | Admitting: Radiology

## 2014-05-24 ENCOUNTER — Telehealth: Payer: Self-pay | Admitting: *Deleted

## 2014-05-24 NOTE — Telephone Encounter (Signed)
Pt called back in regards to lab results. I informed her of results and she expressed understanding .

## 2014-05-26 NOTE — Progress Notes (Signed)
Tried to call patient with lab results but there was no answer and no VM came on.

## 2014-05-27 ENCOUNTER — Encounter: Payer: Self-pay | Admitting: *Deleted

## 2014-05-28 ENCOUNTER — Encounter: Payer: Self-pay | Admitting: *Deleted

## 2014-07-27 ENCOUNTER — Encounter: Payer: Self-pay | Admitting: Family Medicine

## 2014-07-27 ENCOUNTER — Ambulatory Visit (INDEPENDENT_AMBULATORY_CARE_PROVIDER_SITE_OTHER): Payer: BLUE CROSS/BLUE SHIELD | Admitting: Family Medicine

## 2014-07-27 VITALS — BP 100/70 | HR 72 | Temp 98.1°F | Resp 16 | Ht 63.75 in | Wt 163.0 lb

## 2014-07-27 DIAGNOSIS — E7800 Pure hypercholesterolemia, unspecified: Secondary | ICD-10-CM

## 2014-07-27 DIAGNOSIS — Z1239 Encounter for other screening for malignant neoplasm of breast: Secondary | ICD-10-CM

## 2014-07-27 DIAGNOSIS — Z0000 Encounter for general adult medical examination without abnormal findings: Secondary | ICD-10-CM

## 2014-07-27 DIAGNOSIS — Z1159 Encounter for screening for other viral diseases: Secondary | ICD-10-CM

## 2014-07-27 DIAGNOSIS — Z124 Encounter for screening for malignant neoplasm of cervix: Secondary | ICD-10-CM

## 2014-07-27 DIAGNOSIS — Z1211 Encounter for screening for malignant neoplasm of colon: Secondary | ICD-10-CM

## 2014-07-27 DIAGNOSIS — Z01419 Encounter for gynecological examination (general) (routine) without abnormal findings: Secondary | ICD-10-CM

## 2014-07-27 LAB — HEPATITIS C ANTIBODY: HCV AB: NEGATIVE

## 2014-07-27 LAB — LIPID PANEL
Cholesterol: 257 mg/dL — ABNORMAL HIGH (ref 0–200)
HDL: 81 mg/dL (ref >39)
LDL Cholesterol: 158 mg/dL — ABNORMAL HIGH (ref 0–99)
TRIGLYCERIDES: 89 mg/dL (ref <150)
Total CHOL/HDL Ratio: 3.2 Ratio
VLDL: 18 mg/dL (ref 0–40)

## 2014-07-27 LAB — VITAMIN D 25 HYDROXY (VIT D DEFICIENCY, FRACTURES): VIT D 25 HYDROXY: 29 ng/mL — AB (ref 30–100)

## 2014-07-27 MED ORDER — LEVOTHYROXINE SODIUM 88 MCG PO TABS
88.0000 ug | ORAL_TABLET | Freq: Every day | ORAL | Status: DC
Start: 2014-07-27 — End: 2014-12-28

## 2014-07-27 MED ORDER — ALMOTRIPTAN MALATE 6.25 MG PO TABS: 6.2500 mg | ORAL_TABLET | ORAL | Status: DC | PRN

## 2014-07-27 MED ORDER — MELOXICAM 15 MG PO TABS
ORAL_TABLET | ORAL | Status: DC
Start: 2014-07-27 — End: 2014-12-23

## 2014-07-27 MED ORDER — PAROXETINE HCL 40 MG PO TABS: 40.0000 mg | ORAL_TABLET | Freq: Every day | ORAL | Status: DC

## 2014-07-27 NOTE — Patient Instructions (Signed)
Keeping You Healthy  Get These Tests  Blood Pressure- Have your blood pressure checked by your healthcare provider at least once a year.  Normal blood pressure is 120/80.  Weight- Have your body mass index (BMI) calculated to screen for obesity.  BMI is a measure of body fat based on height and weight.  You can calculate your own BMI at [employer]  Cholesterol- Have your cholesterol checked every year.  Diabetes- Have your blood sugar checked every year if you have high blood pressure, high cholesterol, a family history of diabetes or if you are overweight.  Pap Smear- Have a pap smear every 1 to 3 years if you have been sexually active.  If you are older than 65 and recent pap smears have been normal you may not need additional pap smears.  In addition, if you have had a hysterectomy  For benign disease additional pap smears are not necessary.  Mammogram-Yearly mammograms are essential for early detection of breast cancer. I have ordered this imaging study  Screening for Colon Cancer- Colonoscopy starting at age 72. Screening may begin sooner depending on your family history and other health conditions.  Follow up colonoscopy as directed by your Gastroenterologist.  Screening for Osteoporosis- Screening begins at age 65 with bone density scanning, sooner if you are at higher risk for developing Osteoporosis.  Get these medicines  Calcium with Vitamin D- Your body requires 1200-1500 mg of Calcium a day and [phone] IU of Vitamin D a day.  You can only absorb 500 mg of Calcium at a time therefore Calcium must be taken in 2 or 3 separate doses throughout the day.  Hormones- Hormone therapy has been associated with increased risk for certain cancers and heart disease.  Talk to your healthcare provider about if you need relief from menopausal symptoms.  Aspirin- Ask your healthcare provider about taking Aspirin to prevent Heart Disease and Stroke.  Get these Immuniztions  Flu  shot- Every fall  Pneumonia shot- Once after the age of 82; if you are younger ask your healthcare provider if you need a pneumonia shot.  Tetanus- Every ten years.  Zostavax- Once after the age of 2 to prevent shingles.  Take these steps  Don't smoke- Your healthcare provider can help you quit. For tips on how to quit, ask your healthcare provider or go to www.smokefree.gov or call 1-800 QUIT-NOW.  Be physically active- Exercise 5 days a week for a minimum of 30 minutes.  If you are not already physically active, start slow and gradually work up to 30 minutes of moderate physical activity.  Try walking, dancing, bike riding, swimming, etc.  Eat a healthy diet- Eat a variety of healthy foods such as fruits, vegetables, whole grains, low fat milk, low fat cheeses, yogurt, lean meats, chicken, fish, eggs, dried beans, tofu, etc.  For more information go to www.thenutritionsource.org  Dental visit- Brush and floss teeth twice daily; visit your dentist twice a year.  Eye exam- Visit your Optometrist or Ophthalmologist yearly.  Drink alcohol in moderation- Limit alcohol intake to one drink or less a day.  Never drink and drive.  Depression- Your emotional health is as important as your physical health.  If you're feeling down or losing interest in things you normally enjoy, please talk to your healthcare provider.  Seat Belts- can save your life; always wear one  Smoke/Carbon Monoxide detectors- These detectors need to be installed on the appropriate level of your home.  Replace batteries at least once  a year.  Violence- If anyone is threatening or hurting you, please tell your healthcare provider.  Living Will/ Health care power of attorney- Discuss with your healthcare provider and family.

## 2014-07-27 NOTE — Progress Notes (Signed)
Subjective:    Patient ID: Sheri Adkins, female    DOB: 19-Dec-1955, 59 y.o.   MRN: [id number]  HPI  This [name] is here for CPE/PAP. Chronic medical conditions include hypothyroidism, elevated LDL hypercholesterolemia and anxiety. She needs MMG scheduled; family hx includes pat grandmother w/ breast cancer and sister is a 10-year ovarian cancer survivor.   HCM: PAP- > 3 years ago (negative).           CRS- Not yet.           IMM- Current.           Vision- Every 1-2 years.           Dental- Current.  Patient Active Problem List   Diagnosis Date Noted  . Cervical spondylosis 03/11/2014  . Migraine variants, not intractable 03/11/2014  . Unspecified hypothyroidism 11/02/2013  . Depressive disorder, not elsewhere classified 11/02/2013  . Elevated LDL cholesterol level 11/02/2013    Prior to Admission medications   Medication Sig Start Date End Date Taking? Authorizing Provider  almotriptan (AXERT) 6.25 MG tablet Take 1 tablet (6.25 mg total) by mouth as needed for migraine. may repeat in 2 hours if needed 07/27/14  Yes Barton Fanny, MD  Calcium Carbonate-Vitamin D (CALCIUM + D PO) Take by mouth daily.   Yes Historical Provider, MD  levothyroxine (SYNTHROID, LEVOTHROID) 88 MCG tablet Take 1 tablet (88 mcg total) by mouth daily. 07/27/14  Yes Barton Fanny, MD  meloxicam (MOBIC) 15 MG tablet Take 1 tablet once a day with largest meal. 07/27/14  Yes Barton Fanny, MD  Multiple Vitamin (MULTIVITAMIN) tablet Take 1 tablet by mouth daily.   Yes Historical Provider, MD  PARoxetine (PAXIL) 40 MG tablet Take 1 tablet (40 mg total) by mouth daily. 07/27/14  Yes Barton Fanny, MD    History   Social History  . Marital Status: Single    Spouse Name: N/A    Number of Children: N/A  . Years of Education: N/A   Occupational History  . 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 Smoking status: Never Smoker   . Smokeless tobacco:  Not on file  . Alcohol Use: 1.8 oz/week    3 Not specified per week     Comment: 2 a week  . Drug Use: No  . Sexual Activity: Not on file   Other Topics Concern  . Not on file   Social History Narrative   Divorced. No exercise. Education, other   Family History  Problem Relation Age of Onset  . Hypertension Mother   . Stroke Mother   . Mental illness Mother   . Cancer Sister     ovarian cancer  . Mental illness Sister   . Cancer Paternal Grandmother     breast  . Heart disease Paternal Grandfather   . Mental illness Brother   . Cancer Maternal Grandmother   . Mental illness Maternal Grandmother   . Mental illness Brother   . Mental illness Sister      Review of Systems  Constitutional: Negative.   HENT: Negative.   Eyes: Negative.   Respiratory: Negative.   Cardiovascular: Negative.   Gastrointestinal: Negative.   Endocrine: Negative.   Genitourinary: Negative.   Musculoskeletal: Negative.   Skin: Negative.   Allergic/Immunologic: Negative.   Neurological: Negative.   Hematological: Negative.   Psychiatric/Behavioral: Negative.  Objective:   Physical Exam  Constitutional: She is oriented to person, place, and time. Vital signs are normal. She appears well-developed and well-nourished. No distress.  Blood pressure 100/70, pulse 72, temperature 98.1 F (36.7 C), temperature source Oral, resp. rate 16, height 5' 3.75" (1.619 m), weight 163 lb (73.936 kg), SpO2 98 %.   HENT:  Head: Normocephalic and atraumatic.  Right Ear: Hearing, tympanic membrane, external ear and ear canal normal.  Left Ear: Hearing, tympanic membrane, external ear and ear canal normal.  Nose: Nose normal. No nasal deformity or septal deviation.  Mouth/Throat: Uvula is midline, oropharynx is clear and moist and mucous membranes are normal. No oral lesions. Normal dentition. No dental caries.  Eyes: EOM and lids are normal. Pupils are equal, round, and reactive to light. No scleral  icterus.  Fundoscopic exam:      The right eye shows no papilledema. The right eye shows red reflex.       The left eye shows no papilledema. The left eye shows red reflex.  Wears corrective lenses.  Neck: Trachea normal, normal range of motion, full passive range of motion without pain and phonation normal. Neck supple. No spinous process tenderness and no muscular tenderness present. No thyroid mass and no thyromegaly present.  Cardiovascular: Normal rate, regular rhythm, S1 normal, S2 normal, normal heart sounds, intact distal pulses and normal pulses.   No extrasystoles are present. PMI is not displaced.  Exam reveals no gallop and no friction rub.   No murmur heard. Pulmonary/Chest: Effort normal and breath sounds normal. No respiratory distress. Right breast exhibits no inverted nipple, no mass, no nipple discharge, no skin change and no tenderness. Left breast exhibits no inverted nipple, no mass, no nipple discharge, no skin change and no tenderness. Breasts are symmetrical.  Abdominal: Soft. Normal appearance and bowel sounds are normal. She exhibits no distension, no abdominal bruit and no pulsatile midline mass. There is no hepatosplenomegaly. There is no tenderness. There is no guarding and no CVA tenderness.  Genitourinary: Rectum normal, vagina normal and uterus normal. There is no rash, tenderness or lesion on the right labia. There is no rash, tenderness or lesion on the left labia. Uterus is not enlarged and not tender. Cervix exhibits no motion tenderness, no discharge and no friability. Right adnexum displays no mass, no tenderness and no fullness. Left adnexum displays no mass, no tenderness and no fullness.  Musculoskeletal:       Cervical back: Normal.       Thoracic back: Normal.       Lumbar back: Normal.  Remainder of exam unremarkable.  Lymphadenopathy:       Head (right side): No submental, no submandibular, no tonsillar, no preauricular, no posterior auricular and no  occipital adenopathy present.       Head (left side): No submental, no submandibular, no tonsillar, no preauricular, no posterior auricular and no occipital adenopathy present.    She has no cervical adenopathy.    She has no axillary adenopathy.       Right: No inguinal and no supraclavicular adenopathy present.       Left: No inguinal and no supraclavicular adenopathy present.  Neurological: She is alert and oriented to person, place, and time. She has normal strength and normal reflexes. She displays no atrophy. No cranial nerve deficit or sensory deficit. She exhibits normal muscle tone. She displays a negative Romberg sign. Coordination and gait normal.  Skin: Skin is warm, dry and intact. No ecchymosis,  no lesion and no rash noted. She is not diaphoretic. No cyanosis or erythema. No pallor. Nails show no clubbing.  Psychiatric: She has a normal mood and affect. Her speech is normal and behavior is normal. Judgment and thought content normal. Cognition and memory are normal.  PHQ-9 score= 0.  Nursing note and vitals reviewed.      Assessment & Plan:  Encounter for health maintenance examination - Plan: Vitamin D, 25-hydroxy  Encounter for cervical Pap smear with pelvic exam - Plan: Pap IG w/ reflex to HPV when ASC-U  Elevated LDL cholesterol level -  Oct 2015; TChol= 233, HDL= 59, ratio= 3.9, LDL= 150. Plan: Lipid panel  Screening for breast cancer - Plan: MM Digital Screening  Need for hepatitis C screening test - Plan: Hepatitis C Ab Reflex HCV RNA, QUANT  Screening for colon cancer - Plan: IFOBT POC (occult bld, rslt in office)   Meds ordered this encounter  Medications  . PARoxetine (PAXIL) 40 MG tablet    Sig: Take 1 tablet (40 mg total) by mouth daily.    Dispense:  30 tablet    Refill:  5  . almotriptan (AXERT) 6.25 MG tablet    Sig: Take 1 tablet (6.25 mg total) by mouth as needed for migraine. may repeat in 2 hours if needed    Dispense:  20 tablet    Refill:  1    . levothyroxine (SYNTHROID, LEVOTHROID) 88 MCG tablet    Sig: Take 1 tablet (88 mcg total) by mouth daily.    Dispense:  30 tablet    Refill:  5  . meloxicam (MOBIC) 15 MG tablet    Sig: Take 1 tablet once a day with largest meal.    Dispense:  30 tablet    Refill:  5

## 2014-07-28 LAB — PAP IG W/ RFLX HPV ASCU

## 2014-08-01 NOTE — Progress Notes (Signed)
Quick Note:  Please advise pt regarding following labs... PAP is negative (normal)- no abnormal cells detected.  Lipid panel- Total and LDL ('bad") cholesterol are above normal; however, HDL ("good") cholesterol is very high and this is good. The Mediterranean Diet is a great guide for healthy nutrition and may help lower your total and LDL cholesterol values. Your heart disease risk is low but I would suggest you get a Fish Oil supplement and take it daily. Schiff MegaRed Omega-3 Krill Oil is a great brand; they have a formulation that has Vitamin D3 and CoQ10 in it. This would be a good one for you because your Vitamin D is a little low.  Hepatitis C antibody test is negative; this test does not need to be repeated in the future. Continue with current medications and supplements.  Contact the clinic if you have questions or concerns.  Copy to pt.  ______

## 2014-11-12 ENCOUNTER — Telehealth: Payer: Self-pay | Admitting: Family Medicine

## 2014-11-12 NOTE — Telephone Encounter (Signed)
lmom (cell) to reschedule her appt that she has in July with another provoder plus left message with a female on home phone

## 2014-12-23 ENCOUNTER — Ambulatory Visit (INDEPENDENT_AMBULATORY_CARE_PROVIDER_SITE_OTHER): Payer: BLUE CROSS/BLUE SHIELD | Admitting: Family Medicine

## 2014-12-23 ENCOUNTER — Encounter: Payer: Self-pay | Admitting: Family Medicine

## 2014-12-23 VITALS — BP 94/61 | HR 81 | Temp 98.9°F | Resp 16 | Ht 64.0 in | Wt 162.2 lb

## 2014-12-23 DIAGNOSIS — Z79899 Other long term (current) drug therapy: Secondary | ICD-10-CM

## 2014-12-23 DIAGNOSIS — E039 Hypothyroidism, unspecified: Secondary | ICD-10-CM

## 2014-12-23 DIAGNOSIS — E559 Vitamin D deficiency, unspecified: Secondary | ICD-10-CM | POA: Diagnosis not present

## 2014-12-23 DIAGNOSIS — E7800 Pure hypercholesterolemia, unspecified: Secondary | ICD-10-CM

## 2014-12-23 DIAGNOSIS — G43809 Other migraine, not intractable, without status migrainosus: Secondary | ICD-10-CM | POA: Diagnosis not present

## 2014-12-23 LAB — TSH: TSH: 1.581 u[IU]/mL (ref 0.350–4.500)

## 2014-12-23 LAB — COMPREHENSIVE METABOLIC PANEL
ALT: 22 U/L (ref 0–35)
AST: 21 U/L (ref 0–37)
Albumin: 4.1 g/dL (ref 3.5–5.2)
Alkaline Phosphatase: 69 U/L (ref 39–117)
BUN: 8 mg/dL (ref 6–23)
CHLORIDE: 100 meq/L (ref 96–112)
CO2: 27 mEq/L (ref 19–32)
Calcium: 9.2 mg/dL (ref 8.4–10.5)
Creat: 0.75 mg/dL (ref 0.50–1.10)
Glucose, Bld: 94 mg/dL (ref 70–99)
Potassium: 4.6 mEq/L (ref 3.5–5.3)
Sodium: 140 mEq/L (ref 135–145)
TOTAL PROTEIN: 6.2 g/dL (ref 6.0–8.3)
Total Bilirubin: 0.6 mg/dL (ref 0.2–1.2)

## 2014-12-23 MED ORDER — PAROXETINE HCL 40 MG PO TABS: 40.0000 mg | ORAL_TABLET | Freq: Every day | ORAL | Status: DC

## 2014-12-23 MED ORDER — ALMOTRIPTAN MALATE 6.25 MG PO TABS: 6.2500 mg | ORAL_TABLET | ORAL | Status: DC | PRN

## 2014-12-23 MED ORDER — MELOXICAM 15 MG PO TABS: ORAL_TABLET | ORAL | Status: DC

## 2014-12-23 NOTE — Progress Notes (Signed)
Subjective:  This chart was scribed for Delman Cheadle, MD by Moises Blood, Medical Scribe. This patient was seen in Room 23 and the patient's care was started 12:27 PM.    Patient ID: Sheri Adkins, female    DOB: [birth year], 59 y.o.   MRN: [id number] Chief Complaint  Patient presents with  . Medication Refill    PHARMACY: [facility] - levothyroxine, meloxicam and PAXIL, former patient of Dr Leward Quan    HPI Sheri Adkins is a 59 y.o. female who presents to [facility] for medication refill.  She has no problems with her medication. She saw Dr. Leward Quan in the past.  She has been trying to eat more vegetables to help with diet to lose weight.   She takes meloxicam daily to help with trigeminal myalgia. She has been doing yoga and it helps reduce the tension in her body. She never had to get injections. Her stress levels have gone down in the past 7 years.  She sees her optometrist.   She's reports taking fish oil and calcium with Vitamin D.  She denies tarry stool, and heart burn.     Past Medical History  Diagnosis Date  . Depression   . Thyroid disease   . Allergy   . Arthritis    Current Outpatient Prescriptions on File Prior to Visit  Medication Sig Dispense Refill  . almotriptan (AXERT) 6.25 MG tablet Take 1 tablet (6.25 mg total) by mouth as needed for migraine. may repeat in 2 hours if needed 20 tablet 1  . Calcium Carbonate-Vitamin D (CALCIUM + D PO) Take by mouth daily.    Marland Kitchen levothyroxine (SYNTHROID, LEVOTHROID) 88 MCG tablet Take 1 tablet (88 mcg total) by mouth daily. 30 tablet 5  . meloxicam (MOBIC) 15 MG tablet Take 1 tablet once a day with largest meal. 30 tablet 5  . Multiple Vitamin (MULTIVITAMIN) tablet Take 1 tablet by mouth daily.    Marland Kitchen PARoxetine (PAXIL) 40 MG tablet Take 1 tablet (40 mg total) by mouth daily. 30 tablet 5   No current facility-administered medications on file prior to visit.   Allergies  Allergen Reactions  . Penicillins     Rash  . Sulfa  Antibiotics     Rash      Review of Systems  Constitutional: Positive for fatigue. Negative for fever, chills, diaphoresis, activity change, appetite change and unexpected weight change.  Eyes: Negative for visual disturbance.  Respiratory: Negative for cough and shortness of breath.   Cardiovascular: Negative for chest pain, palpitations and leg swelling.  Gastrointestinal: Negative for nausea, abdominal pain, diarrhea, constipation, blood in stool and anal bleeding.  Genitourinary: Negative for decreased urine volume.  Musculoskeletal: Positive for myalgias, back pain, arthralgias, neck pain and neck stiffness.  Neurological: Positive for headaches. Negative for syncope, weakness and numbness.  Hematological: Does not bruise/bleed easily.  Psychiatric/Behavioral: Negative for behavioral problems, confusion, self-injury, dysphoric mood, decreased concentration and agitation.       Objective:   Physical Exam  Constitutional: She is oriented to person, place, and time. She appears well-developed and well-nourished. No distress.  HENT:  Head: Normocephalic and atraumatic.  Eyes: EOM are normal. Pupils are equal, round, and reactive to light.  Neck: Neck supple.  Cardiovascular: Normal rate.   Pulmonary/Chest: Effort normal. No respiratory distress.  Musculoskeletal: Normal range of motion.  Neurological: She is alert and oriented to person, place, and time.  Skin: Skin is warm and dry.  Psychiatric: She has a normal mood and affect.  Her behavior is normal.  Nursing note and vitals reviewed.   BP 94/61 mmHg  Pulse 81  Temp(Src) 98.9 F (37.2 C) (Oral)  Resp 16  Ht 5\' 4"  (1.626 m)  Wt 162 lb 3.2 oz (73.573 kg)  BMI 27.83 kg/m2  SpO2 96%       Assessment & Plan:   1. Migraine variants, not intractable - med refilled  2. Hypothyroidism, unspecified hypothyroidism type - dose stable for years and tsh still normal so refilled 35mcg x 1 yr  3. Elevated LDL cholesterol  level   4. Vitamin D deficiency   5. Polypharmacy     Orders Placed This Encounter  Procedures  . Comprehensive metabolic panel  . TSH    Meds ordered this encounter  Medications  . PARoxetine (PAXIL) 40 MG tablet    Sig: Take 1 tablet (40 mg total) by mouth daily.    Dispense:  90 tablet    Refill:  3  . meloxicam (MOBIC) 15 MG tablet    Sig: Take 1 tablet once a day with largest meal.    Dispense:  90 tablet    Refill:  3  . DISCONTD: almotriptan (AXERT) 6.25 MG tablet    Sig: Take 1 tablet (6.25 mg total) by mouth as needed for migraine. may repeat in 2 hours if needed    Dispense:  27 tablet    Refill:  3  . almotriptan (AXERT) 6.25 MG tablet    Sig: Take 1 tablet (6.25 mg total) by mouth as needed for migraine. may repeat in 2 hours if needed    Dispense:  27 tablet    Refill:  3  . levothyroxine (SYNTHROID, LEVOTHROID) 88 MCG tablet    Sig: Take 1 tablet (88 mcg total) by mouth daily.    Dispense:  90 tablet    Refill:  3    I personally performed the services described in this documentation, which was scribed in my presence. The recorded information has been reviewed and considered, and addended by me as needed.  Delman Cheadle, MD MPH

## 2014-12-23 NOTE — Patient Instructions (Addendum)
High Cholesterol High cholesterol refers to having a high level of cholesterol in your blood. Cholesterol is a white, waxy, fat-like protein that your body needs in small amounts. Your liver makes all the cholesterol you need. Excess cholesterol comes from the food you eat. Cholesterol travels in your bloodstream through your blood vessels. If you have high cholesterol, deposits (plaque) may build up on the walls of your blood vessels. This makes the arteries narrower and stiffer. Plaque increases your risk of heart attack and stroke. Work with your health care provider to keep your cholesterol levels in a healthy range. RISK FACTORS Several things can make you more likely to have high cholesterol. These include:   Eating foods high in animal fat (saturated fat) or cholesterol.  Being overweight.  Not getting enough exercise.  Having a family history of high cholesterol. SIGNS AND SYMPTOMS High cholesterol does not cause symptoms. DIAGNOSIS  Your health care provider can do a blood test to check whether you have high cholesterol. If you are older than 20, your health care provider may check your cholesterol every 4-6 years. You may be checked more often if you already have high cholesterol or other risk factors for heart disease. The blood test for cholesterol measures the following:  Bad cholesterol (LDL cholesterol). This is the type of cholesterol that causes heart disease. This number should be less than 100.  Good cholesterol (HDL cholesterol). This type helps protect against heart disease. A healthy level of HDL cholesterol is 60 or higher.  Total cholesterol. This is the combined number of LDL cholesterol and HDL cholesterol. A healthy number is less than 200. TREATMENT  High cholesterol can be treated with diet changes, lifestyle changes, and medicine.   Diet changes may include eating more whole grains, fruits, vegetables, nuts, and fish. You may also have to cut back on red meat  and foods with a lot of added sugar.  Lifestyle changes may include getting at least 40 minutes of aerobic exercise three times a week. Aerobic exercises include walking, biking, and swimming. Aerobic exercise along with a healthy diet can help you maintain a healthy weight. Lifestyle changes may also include quitting smoking.  If diet and lifestyle changes are not enough to lower your cholesterol, your health care provider may prescribe a statin medicine. This medicine has been shown to lower cholesterol and also lower the risk of heart disease. HOME CARE INSTRUCTIONS  Only take over-the-counter or prescription medicines as directed by your health care provider.   Follow a healthy diet as directed by your health care provider. For instance:   Eat chicken (without skin), fish, veal, shellfish, ground turkey breast, and round or loin cuts of red meat.  Do not eat fried foods and fatty meats, such as hot dogs and salami.   Eat plenty of fruits, such as apples.   Eat plenty of vegetables, such as broccoli, potatoes, and carrots.   Eat beans, peas, and lentils.   Eat grains, such as barley, rice, couscous, and bulgur wheat.   Eat pasta without cream sauces.   Use skim or nonfat milk and low-fat or nonfat yogurt and cheeses. Do not eat or drink whole milk, cream, ice cream, egg yolks, and hard cheeses.   Do not eat stick margarine or tub margarines that contain trans fats (also called partially hydrogenated oils).   Do not eat cakes, cookies, crackers, or other baked goods that contain trans fats.   Do not eat saturated tropical oils, such as   coconut and palm oil.   Exercise as directed by your health care provider. Increase your activity level with activities such as gardening or walking.   Keep all follow-up appointments.  SEEK MEDICAL CARE IF:  You are struggling to maintain a healthy diet or weight.  You need help starting an exercise program.  You need help to  stop smoking. SEEK IMMEDIATE MEDICAL CARE IF:  You have chest pain.  You have trouble breathing. Document Released: 07/01/2005 Document Revised: 11/15/2013 Document Reviewed: 04/23/2013 [facility] Patient Information 2015 [place], [place]. This information is not intended to replace advice given to you by your health care provider. Make sure you discuss any questions you have with your health care provider. Occipital Neuralgia Occipital neuralgia is a type of headache that causes episodes of very bad pain in the back of your head. Pain from occipital neuralgia may spread (radiate) to other parts of your head. The pain is usually brief and often goes away after you rest and relax. These headaches may be caused by irritation of the nerves that leave your spinal cord high up in your neck, just below the base of your skull (occipital nerves). Your occipital nerves transmit sensations from the back of your head, the top of your head, and the areas behind your ears. CAUSES Occipital neuralgia can occur without any known cause (primary headache syndrome). In other cases, occipital neuralgia is caused by pressure on or irritation of one of the two occipital nerves. Causes of occipital nerve compression or irritation include:  Wear and tear of the vertebrae in the neck (osteoarthritis).  Neck injury.  Disease of the disks that separate the vertebrae.  Tumors.  Gout.  Infections.  Diabetes.  Swollen blood vessels that put pressure on the occipital nerves.  Muscle spasm in the neck. SIGNS AND SYMPTOMS Pain is the main symptom of occipital neuralgia. It usually starts in the back of the head but may also be felt in other areas supplied by the occipital nerves. Pain is usually on one side but may be on both sides. You may have:   Brief episodes of very bad pain that is burning, stabbing, shocking, or shooting.  Pain behind the eye.  Pain triggered by neck movement or hair brushing.  Scalp  tenderness.  Aching in the back of the head between episodes of very bad pain. DIAGNOSIS  Your health care provider may diagnose occipital neuralgia based on your symptoms and a physical exam. During the exam, the health care provider may push on areas supplied by the occipital nerves to see if they are painful. Some tests may also be done to help in making the diagnosis. These may include:  Imaging studies of the upper spinal cord, such as an MRI or CT scan. These may show compression or spinal cord abnormalities.  Nerve block. You will get an injection of numbing medicine (local anesthetic) near the occipital nerve to see if this relieves pain. TREATMENT  Treatment may begin with simple measures, such as:   Rest.  Massage.  Heat.  Over-the-counter pain relievers. If these measures do not work, you may need other treatments, including:  Medicines such as:  Prescription-strength anti-inflammatory medicines.  Muscle relaxants.  Antiseizure medicines.  Antidepressants.  Steroid injection. This involves injections of local anesthetic and strong anti-inflammatory drugs (steroids).  Pulsed radiofrequency. Wires are implanted to deliver electrical impulses that block pain signals from the occipital nerve.  Physical therapy.  Surgery to relieve nerve pressure. HOME CARE INSTRUCTIONS  Take  all medicines as directed by your health care provider.  Avoid activities that cause pain.  Rest when you have an attack of pain.  Try gentle massage or a heating pad to relieve pain.  Work with a physical therapist to learn stretching exercises you can do at home.  Try a different pillow or sleeping position.  Practice good posture.  Try to stay active. Get regular exercise that does not cause pain. Ask your health care provider to suggest safe exercises for you.  Keep all follow-up visits as directed by your health care provider. This is important. SEEK MEDICAL CARE IF:  Your  medicine is not working.  You have new or worsening symptoms. SEEK IMMEDIATE MEDICAL CARE IF:  You have very bad head pain that is not going away.  You have a sudden change in vision, balance, or speech. MAKE SURE YOU:  Understand these instructions.  Will watch your condition.  Will get help right away if you are not doing well or get worse. Document Released: 06/25/2001 Document Revised: 11/15/2013 Document Reviewed: 06/23/2013 [facility] Patient Information 2015 [place], [place]. This information is not intended to replace advice given to you by your health care provider. Make sure you discuss any questions you have with your health care provider. Trigeminal Neuralgia Trigeminal neuralgia is a nerve disorder that causes sudden attacks of severe facial pain. It is caused by damage to the trigeminal nerve, a major nerve in the face. It is more common in women and in the elderly, although it can also happen in younger patients. Attacks last from a few seconds to several minutes and can occur from a couple of times per year to several times per day. Trigeminal neuralgia can be a very distressing and disabling condition. Surgery may be needed in very severe cases if medical treatment does not give relief. HOME CARE INSTRUCTIONS   If your caregiver prescribed medication to help prevent attacks, take as directed.  To help prevent attacks:  Chew on the unaffected side of the mouth.  Avoid touching your face.  Avoid blasts of hot or cold air.  Men may wish to grow a beard to avoid having to shave. SEEK IMMEDIATE MEDICAL CARE IF:  Pain is unbearable and your medicine does not help.  You develop new, unexplained symptoms (problems).  You have problems that may be related to a medication you are taking. Document Released: 06/28/2000 Document Revised: 09/23/2011 Document Reviewed: 04/28/2009 [facility] Patient Information 2015 [place], [place]. This information is not intended to replace  advice given to you by your health care provider. Make sure you discuss any questions you have with your health care provider.  Fat and Cholesterol Control Diet Fat and cholesterol levels in your blood and organs are influenced by your diet. High levels of fat and cholesterol may lead to diseases of the heart, small and large blood vessels, gallbladder, liver, and pancreas. CONTROLLING FAT AND CHOLESTEROL WITH DIET Although exercise and lifestyle factors are important, your diet is key. That is because certain foods are known to raise cholesterol and others to lower it. The goal is to balance foods for their effect on cholesterol and more importantly, to replace saturated and trans fat with other types of fat, such as monounsaturated fat, polyunsaturated fat, and omega-3 fatty acids. On average, a person should consume no more than 15 to 17 g of saturated fat daily. Saturated and trans fats are considered "bad" fats, and they will raise LDL cholesterol. Saturated fats are primarily found in  animal products such as meats, butter, and cream. However, that does not mean you need to give up all your favorite foods. Today, there are good tasting, low-fat, low-cholesterol substitutes for most of the things you like to eat. Choose low-fat or nonfat alternatives. Choose round or loin cuts of red meat. These types of cuts are lowest in fat and cholesterol. Chicken (without the skin), fish, veal, and ground [place] breast are great choices. Eliminate fatty meats, such as hot dogs and salami. Even shellfish have little or no saturated fat. Have a 3 oz (85 g) portion when you eat lean meat, poultry, or fish. Trans fats are also called "partially hydrogenated oils." They are oils that have been scientifically manipulated so that they are solid at room temperature resulting in a longer shelf life and improved taste and texture of foods in which they are added. Trans fats are found in stick margarine, some tub margarines,  cookies, crackers, and baked goods.  When baking and cooking, oils are a great substitute for butter. The monounsaturated oils are especially beneficial since it is believed they lower LDL and raise HDL. The oils you should avoid entirely are saturated tropical oils, such as coconut and palm.  Remember to eat a lot from food groups that are naturally free of saturated and trans fat, including fish, fruit, vegetables, beans, grains (barley, rice, couscous, bulgur wheat), and pasta (without cream sauces).  IDENTIFYING FOODS THAT LOWER FAT AND CHOLESTEROL  Soluble fiber may lower your cholesterol. This type of fiber is found in fruits such as apples, vegetables such as broccoli, potatoes, and carrots, legumes such as beans, peas, and lentils, and grains such as barley. Foods fortified with plant sterols (phytosterol) may also lower cholesterol. You should eat at least 2 g per day of these foods for a cholesterol lowering effect.  Read package labels to identify low-saturated fats, trans fat free, and low-fat foods at the supermarket. Select cheeses that have only 2 to 3 g saturated fat per ounce. Use a heart-healthy tub margarine that is free of trans fats or partially hydrogenated oil. When buying baked goods (cookies, crackers), avoid partially hydrogenated oils. Breads and muffins should be made from whole grains (whole-wheat or whole oat flour, instead of "flour" or "enriched flour"). Buy non-creamy canned soups with reduced salt and no added fats.  FOOD PREPARATION TECHNIQUES  Never deep-fry. If you must fry, either stir-fry, which uses very little fat, or use non-stick cooking sprays. When possible, broil, bake, or roast meats, and steam vegetables. Instead of putting butter or margarine on vegetables, use lemon and herbs, applesauce, and cinnamon (for squash and sweet potatoes). Use nonfat yogurt, salsa, and low-fat dressings for salads.  LOW-SATURATED FAT / LOW-FAT FOOD SUBSTITUTES Meats / Saturated  Fat (g)  Avoid: Steak, marbled (3 oz/85 g) / 11 g  Choose: Steak, lean (3 oz/85 g) / 4 g  Avoid: Hamburger (3 oz/85 g) / 7 g  Choose: Hamburger, lean (3 oz/85 g) / 5 g  Avoid: Ham (3 oz/85 g) / 6 g  Choose: Ham, lean cut (3 oz/85 g) / 2.4 g  Avoid: Chicken, with skin, dark meat (3 oz/85 g) / 4 g  Choose: Chicken, skin removed, dark meat (3 oz/85 g) / 2 g  Avoid: Chicken, with skin, light meat (3 oz/85 g) / 2.5 g  Choose: Chicken, skin removed, light meat (3 oz/85 g) / 1 g Dairy / Saturated Fat (g)  Avoid: Whole milk (1 cup) / 5 g  Choose: Low-fat milk, 2% (1 cup) / 3 g  Choose: Low-fat milk, 1% (1 cup) / 1.5 g  Choose: Skim milk (1 cup) / 0.3 g  Avoid: Hard cheese (1 oz/28 g) / 6 g  Choose: Skim milk cheese (1 oz/28 g) / 2 to 3 g  Avoid: Cottage cheese, 4% fat (1 cup) / 6.5 g  Choose: Low-fat cottage cheese, 1% fat (1 cup) / 1.5 g  Avoid: Ice cream (1 cup) / 9 g  Choose: Sherbet (1 cup) / 2.5 g  Choose: Nonfat frozen yogurt (1 cup) / 0.3 g  Choose: Frozen fruit bar / trace  Avoid: Whipped cream (1 tbs) / 3.5 g  Choose: Nondairy whipped topping (1 tbs) / 1 g Condiments / Saturated Fat (g)  Avoid: Mayonnaise (1 tbs) / 2 g  Choose: Low-fat mayonnaise (1 tbs) / 1 g  Avoid: Butter (1 tbs) / 7 g  Choose: Extra light margarine (1 tbs) / 1 g  Avoid: Coconut oil (1 tbs) / 11.8 g  Choose: Olive oil (1 tbs) / 1.8 g  Choose: Corn oil (1 tbs) / 1.7 g  Choose: Safflower oil (1 tbs) / 1.2 g  Choose: Sunflower oil (1 tbs) / 1.4 g  Choose: Soybean oil (1 tbs) / 2.4 g  Choose: Canola oil (1 tbs) / 1 g Document Released: 07/01/2005 Document Revised: 10/26/2012 Document Reviewed: 09/29/2013 [employer] Patient Information 2015 [place], [place]. This information is not intended to replace advice given to you by your health care provider. Make sure you discuss any questions you have with your health care provider.

## 2014-12-28 ENCOUNTER — Encounter: Payer: Self-pay | Admitting: Family Medicine

## 2014-12-28 MED ORDER — LEVOTHYROXINE SODIUM 88 MCG PO TABS: 88.0000 ug | ORAL_TABLET | Freq: Every day | ORAL | Status: DC

## 2014-12-29 ENCOUNTER — Telehealth: Payer: Self-pay

## 2014-12-29 NOTE — Telephone Encounter (Signed)
Got fax from [facility] asking for clarification as to whether Dr Brigitte Pulse purposely changed pt's Paxil to name brand. They advised that pt has been taking the generic, but the new Rx came through as "Paxil DAW 1"? Dr Brigitte Pulse, I don't see anything in your note that suggest you wanted pt to change to name brand, in fact you mentioned that pt's medications were working well. I wanted to check with you to make sure though before sending clarification back to pharm.

## 2014-12-29 NOTE — Telephone Encounter (Signed)
Did NOT mean to change to brand-name only - fine to continue on generic. Thanks.

## 2015-01-02 NOTE — Telephone Encounter (Signed)
Faxed notice back, OK to dispense generic.

## 2015-01-25 ENCOUNTER — Ambulatory Visit: Payer: BLUE CROSS/BLUE SHIELD | Admitting: Family Medicine

## 2015-11-15 ENCOUNTER — Encounter (HOSPITAL_COMMUNITY): Payer: Self-pay | Admitting: Emergency Medicine

## 2015-11-15 ENCOUNTER — Emergency Department (HOSPITAL_COMMUNITY)
Admission: EM | Admit: 2015-11-15 | Discharge: 2015-11-16 | Disposition: A | Discharge status: Home / Self Care | Payer: BLUE CROSS/BLUE SHIELD | Attending: Emergency Medicine | Admitting: Emergency Medicine

## 2015-11-15 DIAGNOSIS — E039 Hypothyroidism, unspecified: Secondary | ICD-10-CM | POA: Diagnosis not present

## 2015-11-15 DIAGNOSIS — M1990 Unspecified osteoarthritis, unspecified site: Secondary | ICD-10-CM | POA: Diagnosis not present

## 2015-11-15 DIAGNOSIS — Z79899 Other long term (current) drug therapy: Secondary | ICD-10-CM | POA: Insufficient documentation

## 2015-11-15 DIAGNOSIS — F329 Major depressive disorder, single episode, unspecified: Secondary | ICD-10-CM | POA: Insufficient documentation

## 2015-11-15 DIAGNOSIS — Z880 Allergy status to penicillin: Secondary | ICD-10-CM | POA: Diagnosis not present

## 2015-11-15 DIAGNOSIS — G43109 Migraine with aura, not intractable, without status migrainosus: Secondary | ICD-10-CM | POA: Insufficient documentation

## 2015-11-15 DIAGNOSIS — Z791 Long term (current) use of non-steroidal anti-inflammatories (NSAID): Secondary | ICD-10-CM | POA: Insufficient documentation

## 2015-11-15 DIAGNOSIS — R51 Headache: Secondary | ICD-10-CM | POA: Diagnosis present

## 2015-11-15 HISTORY — DX: Migraine, unspecified, not intractable, without status migrainosus: G43.909

## 2015-11-15 MED ORDER — SODIUM CHLORIDE 0.9 % IV BOLUS (SEPSIS)
1000.0000 mL | Freq: Once | INTRAVENOUS | Status: AC
  Administered 2015-11-15: 1000 mL via INTRAVENOUS

## 2015-11-15 MED ORDER — DIPHENHYDRAMINE HCL 50 MG/ML IJ SOLN
25.0000 mg | Freq: Once | INTRAMUSCULAR | Status: AC
Start: 2015-11-15 — End: 2015-11-15
  Administered 2015-11-15: 25 mg via INTRAVENOUS
  Filled 2015-11-15: qty 1

## 2015-11-15 MED ORDER — METOCLOPRAMIDE HCL 5 MG/ML IJ SOLN
10.0000 mg | Freq: Once | INTRAMUSCULAR | Status: AC
  Administered 2015-11-15: 10 mg via INTRAVENOUS
  Filled 2015-11-15: qty 2

## 2015-11-15 MED ORDER — KETOROLAC TROMETHAMINE 30 MG/ML IJ SOLN
30.0000 mg | Freq: Once | INTRAMUSCULAR | Status: AC
  Administered 2015-11-15: 30 mg via INTRAVENOUS
  Filled 2015-11-15: qty 1

## 2015-11-15 NOTE — ED Provider Notes (Signed)
CSN: [id number]     Arrival date & time 11/15/15  2159 History   First MD Initiated Contact with Patient 11/15/15 2246     Chief Complaint  Patient presents with  . Migraine   HPI Comments: 60 year old female who presents with a headache for the past several hours. PMH significant for migraines, hypothyroidism, depression. The headache came on gradually. It is unilateral on the L side of her face. Feels exactly like her migraines however it is the most severe migraine she has had in a while. Reports associated N/V, vision changes in her L eye, photophobia, phonophobia. She takes almotriptan for abortive therapy however that did not work. Did not try Ibuprofen. Denies fever, chills, LOC, neck stiffness, acute head trauma.   Patient is a 59 y.o. female presenting with migraines.  Migraine Associated symptoms include headaches, nausea and vomiting. Pertinent negatives include no chest pain, fever, neck pain, numbness or weakness.    Past Medical History  Diagnosis Date  . Depression   . Thyroid disease   . Allergy   . Arthritis   . Migraine    History reviewed. No pertinent past surgical history. Family History  Problem Relation Age of Onset  . Hypertension Mother   . Stroke Mother   . Mental illness Mother   . Cancer Sister     ovarian cancer  . Mental illness Sister   . Cancer Paternal Grandmother     breast  . Heart disease Paternal Grandfather   . Mental illness Brother   . Cancer Maternal Grandmother   . Mental illness Maternal Grandmother   . Mental illness Brother   . Mental illness Sister    Social History  Substance Use Topics  . Smoking status: Never Smoker   . Smokeless tobacco: None  . Alcohol Use: 1.8 oz/week    3 Standard drinks or equivalent per week     Comment: 2 a week   OB History    No data available     Review of Systems  Constitutional: Negative for fever.  Eyes: Positive for visual disturbance.  Respiratory: Negative for shortness of breath.    Cardiovascular: Negative for chest pain.  Gastrointestinal: Positive for nausea and vomiting.  Musculoskeletal: Negative for neck pain and neck stiffness.  Neurological: Positive for headaches. Negative for dizziness, seizures, syncope, speech difficulty, weakness and numbness.  All other systems reviewed and are negative.   Allergies  Penicillins and Sulfa antibiotics  Home Medications   Prior to Admission medications   Medication Sig Start Date End Date Taking? Authorizing Provider  almotriptan (AXERT) 6.25 MG tablet Take 1 tablet (6.25 mg total) by mouth as needed for migraine. may repeat in 2 hours if needed 12/23/14  Yes Shawnee Knapp, MD  Calcium Carbonate-Vitamin D (CALCIUM + D PO) Take 1 tablet by mouth daily.    Yes Historical Provider, MD  levothyroxine (SYNTHROID, LEVOTHROID) 88 MCG tablet Take 1 tablet (88 mcg total) by mouth daily. 12/28/14  Yes Shawnee Knapp, MD  meloxicam (MOBIC) 15 MG tablet Take 1 tablet once a day with largest meal. 12/23/14  Yes Shawnee Knapp, MD  Multiple Vitamin (MULTIVITAMIN) tablet Take 1 tablet by mouth daily.   Yes Historical Provider, MD  PARoxetine (PAXIL) 40 MG tablet Take 1 tablet (40 mg total) by mouth daily. 12/23/14  Yes Shawnee Knapp, MD   BP 168/91 mmHg  Pulse 87  Temp(Src) 97.8 F (36.6 C) (Oral)  Resp 22  Ht 5'  4" (1.626 m)  Wt 75.751 kg  BMI 28.65 kg/m2  SpO2 98%  Physical Exam  Constitutional: She is oriented to person, place, and time. She appears well-developed and well-nourished. No distress.  HENT:  Head: Normocephalic and atraumatic.  Eyes: Conjunctivae are normal. Pupils are equal, round, and reactive to light. Right eye exhibits no discharge. Left eye exhibits no discharge. No scleral icterus.  Neck: Normal range of motion.  Cardiovascular: Normal rate and regular rhythm.  Exam reveals no gallop and no friction rub.   No murmur heard. Pulmonary/Chest: Effort normal and breath sounds normal. No respiratory distress. She has no  wheezes. She has no rales. She exhibits no tenderness.  Neurological: She is alert and oriented to person, place, and time.  Mental Status:  Alert, oriented, thought content appropriate, able to give a coherent history. Speech fluent without evidence of aphasia. Able to follow 2 step commands without difficulty.  Cranial Nerves:  II:  Peripheral visual fields grossly normal, pupils equal, round, reactive to light III,IV, VI: ptosis not present, extra-ocular motions intact bilaterally  V,VII: smile symmetric, facial light touch sensation equal VIII: hearing grossly normal to voice  X: uvula elevates symmetrically  XI: bilateral shoulder shrug symmetric and strong XII: midline tongue extension without fassiculations Motor:  Normal tone. 5/5 in upper and lower extremities bilaterally including strong and equal grip strength and dorsiflexion/plantar flexion Sensory: Pinprick and light touch normal in all extremities.  Deep Tendon Reflexes: 2+ and symmetric in the biceps and patella Cerebellar: normal finger-to-nose with bilateral upper extremities Gait: normal gait and balance CV: distal pulses palpable throughout     Skin: Skin is warm and dry.  Psychiatric: She has a normal mood and affect.    ED Course  Procedures (including critical care time) Labs Review Labs Reviewed - No data to display  Imaging Review No results found. I have personally reviewed and evaluated these images and lab results as part of my medical decision-making.   EKG Interpretation None     Meds given in ED:  Medications  sodium chloride 0.9 % bolus 1,000 mL (0 mLs Intravenous Stopped 11/16/15 0013)  ketorolac (TORADOL) 30 MG/ML injection 30 mg (30 mg Intravenous Given 11/15/15 2327)  diphenhydrAMINE (BENADRYL) injection 25 mg (25 mg Intravenous Given 11/15/15 2327)  metoCLOPramide (REGLAN) injection 10 mg (10 mg Intravenous Given 11/15/15 2327)    Discharge Medication List as of 11/16/2015 12:08 AM        MDM   Final diagnoses:  Migraine with aura and without status migrainosus, not intractable   60 year old relatively healthy female who presents with a headache which she was not able to treat with home medications.  Her BP was elevated in triage however after medication was given in the ED, her BP has improved. All other vitals are WNL and stable. Her PE was unremarkable; no focal neuro deficits and patient is able to ambulate without difficulty. Migraine cocktail and IVF given with rapid improvement of symptoms.   Patient is NAD, non-toxic, with improved/stable VS. Patient is informed of clinical course, understands medical decision making process, and agrees with plan. Opportunity for questions provided and all questions answered. Return precautions given.    Recardo Evangelist, PA-C 11/16/15 1414  Wandra Arthurs, MD 11/16/15 2005

## 2015-11-15 NOTE — ED Notes (Signed)
Pt is c/o migraine headache that started around 1830  Pt has nausea and vomiting  Pt has photophobia

## 2015-11-16 NOTE — Discharge Instructions (Signed)

## 2015-11-16 NOTE — ED Notes (Signed)
Patient was alert, oriented and stable upon discharge. RN went over AVS and patient had no further questions.  

## 2015-12-14 ENCOUNTER — Ambulatory Visit (INDEPENDENT_AMBULATORY_CARE_PROVIDER_SITE_OTHER): Payer: BLUE CROSS/BLUE SHIELD | Admitting: Family Medicine

## 2015-12-14 ENCOUNTER — Ambulatory Visit (INDEPENDENT_AMBULATORY_CARE_PROVIDER_SITE_OTHER): Payer: BLUE CROSS/BLUE SHIELD

## 2015-12-14 ENCOUNTER — Encounter: Payer: Self-pay | Admitting: Family Medicine

## 2015-12-14 VITALS — BP 115/68 | HR 91 | Temp 98.8°F | Resp 16 | Ht 64.0 in | Wt 167.2 lb

## 2015-12-14 DIAGNOSIS — F32A Depression, unspecified: Secondary | ICD-10-CM

## 2015-12-14 DIAGNOSIS — R0989 Other specified symptoms and signs involving the circulatory and respiratory systems: Secondary | ICD-10-CM

## 2015-12-14 DIAGNOSIS — E039 Hypothyroidism, unspecified: Secondary | ICD-10-CM

## 2015-12-14 DIAGNOSIS — Z1389 Encounter for screening for other disorder: Secondary | ICD-10-CM

## 2015-12-14 DIAGNOSIS — Z1383 Encounter for screening for respiratory disorder NEC: Secondary | ICD-10-CM | POA: Diagnosis not present

## 2015-12-14 DIAGNOSIS — Z113 Encounter for screening for infections with a predominantly sexual mode of transmission: Secondary | ICD-10-CM | POA: Diagnosis not present

## 2015-12-14 DIAGNOSIS — F329 Major depressive disorder, single episode, unspecified: Secondary | ICD-10-CM | POA: Diagnosis not present

## 2015-12-14 DIAGNOSIS — Z136 Encounter for screening for cardiovascular disorders: Secondary | ICD-10-CM | POA: Diagnosis not present

## 2015-12-14 DIAGNOSIS — E7800 Pure hypercholesterolemia, unspecified: Secondary | ICD-10-CM

## 2015-12-14 DIAGNOSIS — Z0000 Encounter for general adult medical examination without abnormal findings: Secondary | ICD-10-CM

## 2015-12-14 DIAGNOSIS — Z1239 Encounter for other screening for malignant neoplasm of breast: Secondary | ICD-10-CM

## 2015-12-14 DIAGNOSIS — Z130 Encounter for screening for diseases of the blood and blood-forming organs and certain disorders involving the immune mechanism: Secondary | ICD-10-CM | POA: Diagnosis not present

## 2015-12-14 DIAGNOSIS — E559 Vitamin D deficiency, unspecified: Secondary | ICD-10-CM

## 2015-12-14 DIAGNOSIS — Z1211 Encounter for screening for malignant neoplasm of colon: Secondary | ICD-10-CM | POA: Diagnosis not present

## 2015-12-14 LAB — CBC
HEMATOCRIT: 36.6 % (ref 35.0–45.0)
Hemoglobin: 12.3 g/dL (ref 11.7–15.5)
MCH: 30.8 pg (ref 27.0–33.0)
MCHC: 33.6 g/dL (ref 32.0–36.0)
MCV: 91.7 fL (ref 80.0–100.0)
MPV: 9.7 fL (ref 7.5–12.5)
Platelets: 234 10*3/uL (ref 140–400)
RBC: 3.99 MIL/uL (ref 3.80–5.10)
RDW: 13.1 % (ref 11.0–15.0)
WBC: 4 10*3/uL (ref 3.8–10.8)

## 2015-12-14 LAB — COMPREHENSIVE METABOLIC PANEL
ALBUMIN: 3.9 g/dL (ref 3.6–5.1)
ALK PHOS: 57 U/L (ref 33–130)
ALT: 20 U/L (ref 6–29)
AST: 19 U/L (ref 10–35)
BUN: 9 mg/dL (ref 7–25)
CALCIUM: 8.7 mg/dL (ref 8.6–10.4)
CO2: 26 mmol/L (ref 20–31)
Chloride: 105 mmol/L (ref 98–110)
Creat: 0.74 mg/dL (ref 0.50–1.05)
GLUCOSE: 87 mg/dL (ref 65–99)
POTASSIUM: 4 mmol/L (ref 3.5–5.3)
Sodium: 139 mmol/L (ref 135–146)
Total Bilirubin: 0.6 mg/dL (ref 0.2–1.2)
Total Protein: 5.9 g/dL — ABNORMAL LOW (ref 6.1–8.1)

## 2015-12-14 LAB — LIPID PANEL
Cholesterol: 215 mg/dL — ABNORMAL HIGH (ref 125–200)
HDL: 68 mg/dL (ref >=46)
LDL CALC: 125 mg/dL (ref <130)
TRIGLYCERIDES: 109 mg/dL (ref <150)
Total CHOL/HDL Ratio: 3.2 Ratio (ref <=5.0)
VLDL: 22 mg/dL (ref <30)

## 2015-12-14 LAB — TSH: TSH: 3.57 mIU/L

## 2015-12-14 LAB — POCT URINALYSIS DIP (MANUAL ENTRY)
Bilirubin, UA: NEGATIVE
GLUCOSE UA: NEGATIVE
Ketones, POC UA: NEGATIVE
NITRITE UA: NEGATIVE
PH UA: 8.5
RBC UA: NEGATIVE
Spec Grav, UA: 1.015
UROBILINOGEN UA: 0.2

## 2015-12-14 MED ORDER — ALMOTRIPTAN MALATE 6.25 MG PO TABS: 6.2500 mg | ORAL_TABLET | ORAL | Status: DC | PRN

## 2015-12-14 MED ORDER — FLUTICASONE PROPIONATE 50 MCG/ACT NA SUSP: 2.0000 | Freq: Every day | NASAL | Status: DC

## 2015-12-14 MED ORDER — MELOXICAM 15 MG PO TABS: 15.0000 mg | ORAL_TABLET | Freq: Every day | ORAL | Status: DC

## 2015-12-14 MED ORDER — PAROXETINE HCL 40 MG PO TABS: 40.0000 mg | ORAL_TABLET | Freq: Every day | ORAL | Status: DC

## 2015-12-14 MED ORDER — LEVOTHYROXINE SODIUM 88 MCG PO TABS: 88.0000 ug | ORAL_TABLET | Freq: Every day | ORAL | Status: DC

## 2015-12-14 NOTE — Patient Instructions (Addendum)
You need to schedule you mammogram and your bone scan.  Please call the breast center at [facility] at [phone] to schedule    IF you received an x-ray today, you will receive an invoice from [facility]. Please contact [facility] at [phone] with questions or concerns regarding your invoice.   IF you received labwork today, you will receive an invoice from [employer]. Please contact [facility] at [phone] with questions or concerns regarding your invoice.   Our billing staff will not be able to assist you with questions regarding bills from these companies.  You will be contacted with the lab results as soon as they are available. The fastest way to get your results is to activate your My Chart account. Instructions are located on the last page of this paperwork. If you have not heard from [place] regarding the results in 2 weeks, please contact this office.

## 2015-12-14 NOTE — Progress Notes (Signed)
Subjective:    Patient ID: Sheri Adkins, female    DOB: 12/24/1955, 60 y.o.   MRN: [id number] Chief Complaint  Patient presents with  . Annual Exam    no pap    HPI  Sheri Adkins is a 60 yo woman here for a CPE.  Chronic medical conditions include hypothyroidism, elevated LDL hypercholesterolemia and anxiety. She needs MMG scheduled; family hx includes pat grandmother w/ breast cancer and older sister is a >10-year ovarian cancer survivor.  She has been told in the past that she needs increased monitoring for this but does not have any information about exactly which type of cancer.    HCM: PAP- > nml 07/2014 so will be due for repeat 07/2017.  Mam: no yet (done 2010) - she just keeps forgetting to schedule  CRS- Not yet. Ordered annual [place] last yr that was not returned  IMM- Current. With TdaP done 2013 and annual flu shot  Vision- Every 1-2 years.  Dental- Current.  STI screening: Neg Hep C 07/2014  Lipid panel last yr showed elevated LDL so was recommended to work on [employer] and start a fish oil supplment.  Oct 2015-> Jan 2016; TChol= 233->257, HDL= 59->81, Trig = 121->89, LDL= 150->158. Vitamin D was a little low at 29  Migraines are worse when the seasonal allergies.  Forgets to do a seasonal allergy sinus rinse/needs to do netti pot more freq. She does get an aura and so if she treats with the triptan immediately it will abort. She was on loratadine for a long time but her mother had Alzheimer's so she is very sensitive to anything that might exacerbate her risk of getting Alzheimers  She is taking the meloxicam once a day every days as helped with her back pain during work. She has started yoga which is wonderful. She does have some supplements but is not great about taking anything regularly. She has 2 sisters - both of them have been diagnosed with osteoporosis and so they have both increased their diet in calcium. She is  considering getting a Physiological scientist.  Past Medical History  Diagnosis Date  . Depression   . Thyroid disease   . Allergy   . Arthritis   . Migraine    No past surgical history on file. Current Outpatient Prescriptions on File Prior to Visit  Medication Sig Dispense Refill  . Calcium Carbonate-Vitamin D (CALCIUM + D PO) Take 1 tablet by mouth daily.     . Multiple Vitamin (MULTIVITAMIN) tablet Take 1 tablet by mouth daily.     No current facility-administered medications on file prior to visit.   Allergies  Allergen Reactions  . Penicillins     Has patient had a PCN reaction causing immediate rash, facial/tongue/throat swelling, SOB or lightheadedness with hypotension: Yes Has patient had a PCN reaction causing severe rash involving mucus membranes or skin necrosis: No Has patient had a PCN reaction that required hospitalization No Has patient had a PCN reaction occurring within the last 10 years: No If all of the above answers are "NO", then may proceed with Cephalosporin use.   . Sulfa Antibiotics     Rash   Family History  Problem Relation Age of Onset  . Hypertension Mother   . Stroke Mother   . Mental illness Mother   . Cancer Sister     ovarian cancer  . Mental illness Sister   . Cancer Paternal Grandmother     breast  . Heart  disease Paternal Grandfather   . Mental illness Brother   . Cancer Maternal Grandmother   . Mental illness Maternal Grandmother   . Mental illness Brother   . Mental illness Sister    Social History   Social History  . Marital Status: Single    Spouse Name: N/A  . Number of Children: N/A  . Years of Education: N/A   Occupational History  . 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 Smoking status: Never Smoker   . Smokeless tobacco: None  . Alcohol Use: 1.8 oz/week    3 Standard drinks or equivalent per week     Comment: 2 a week  . Drug Use: No  . Sexual Activity: Not Asked   Other  Topics Concern  . None   Social History Narrative   Divorced. No exercise. Education, other     Review of Systems  All other systems reviewed and are negative.  See HPI    Objective:  BP 115/68 mmHg  Pulse 91  Temp(Src) 98.8 F (37.1 C) (Oral)  Resp 16  Ht 5\' 4"  (1.626 m)  Wt 167 lb 3.2 oz (75.841 kg)  BMI 28.69 kg/m2  SpO2 95%  Physical Exam  Constitutional: She is oriented to person, place, and time. She appears well-developed and well-nourished. No distress.  HENT:  Head: Normocephalic and atraumatic.  Right Ear: Tympanic membrane, external ear and ear canal normal.  Left Ear: Tympanic membrane, external ear and ear canal normal.  Nose: Mucosal edema present. No rhinorrhea.  Mouth/Throat: Uvula is midline, oropharynx is clear and moist and mucous membranes are normal. No posterior oropharyngeal erythema.  Eyes: Conjunctivae and EOM are normal. Pupils are equal, round, and reactive to light. Right eye exhibits no discharge. Left eye exhibits no discharge. No scleral icterus.  Neck: Normal range of motion. Neck supple. No thyromegaly present.  Cardiovascular: Normal rate, regular rhythm, normal heart sounds and intact distal pulses.   Pulmonary/Chest: Effort normal and breath sounds normal. No respiratory distress.  Bronchial breath sounds rul  Abdominal: Soft. Bowel sounds are normal. There is no tenderness.  Genitourinary: No breast swelling, tenderness, discharge or bleeding. Uterus is tender. Uterus is not enlarged and not fixed. Right adnexum displays tenderness. Right adnexum displays no mass. Left adnexum displays tenderness. Left adnexum displays no mass. There is tenderness in the vagina.  Right upper outer quadrant breast mass Normal bimanual - but is tender all over and cervix not palp  Musculoskeletal: She exhibits no edema.  Lymphadenopathy:    She has no cervical adenopathy.  Neurological: She is alert and oriented to person, place, and time. She has normal  reflexes.  Skin: Skin is warm and dry. She is not diaphoretic. No erythema.  Psychiatric: She has a normal mood and affect. Her behavior is normal.     EKG: NSR, low voltage     Dg Chest 2 View  12/14/2015  CLINICAL DATA:  Abnormal lung sounds on physical examination EXAM: CHEST  2 VIEW COMPARISON:  None. FINDINGS: Lungs are clear. Heart size and pulmonary vascularity are normal. No adenopathy. No bone lesions. IMPRESSION: No edema or consolidation. Electronically Signed   By: Lowella Grip III M.D.   On: 12/14/2015 10:37    Assessment & Plan:   1. Annual physical exam   2. Routine screening for STI (sexually transmitted infection)   3. Screening for breast cancer - reminded pt to sched mammogram  4. Screening for cardiovascular, respiratory, and genitourinary diseases   5. Screening for deficiency anemia   6. Hypothyroidism, unspecified hypothyroidism type   7. Depression   8. Elevated LDL cholesterol level   9. Vitamin D deficiency  - do for dexa.  10. Abnormal lung sounds - nl cxr  11. Special screening for malignant neoplasms, colon - pt prefers cologuard annually    Orders Placed This Encounter  Procedures  . DG Chest 2 View    Standing Status: Future     Number of Occurrences: 1     Standing Expiration Date: 12/13/2016    Order Specific Question:  Reason for Exam (SYMPTOM  OR DIAGNOSIS REQUIRED)    Answer:  right upper lobe bronchial breath sounds    Order Specific Question:  Is the patient pregnant?    Answer:  No    Order Specific Question:  Preferred imaging location?    Answer:  [facility]  . MM Digital Screening Unilat L    Standing Status: Future     Number of Occurrences:      Standing Expiration Date: 02/12/2017    Order Specific Question:  Reason for Exam (SYMPTOM  OR DIAGNOSIS REQUIRED)    Answer:  screening    Order Specific Question:  Is the patient pregnant?    Answer:  No    Order Specific Question:  Preferred imaging location?    Answer:  [facility]  . CBC  . HIV antibody  . Lipid panel    Order Specific Question:  Has the patient fasted?    Answer:  Yes  . TSH  . Comprehensive metabolic panel    Order Specific Question:  Has the patient fasted?    Answer:  Yes  . VITAMIN D 25 Hydroxy (Vit-D Deficiency, Fractures)  . Cologuard  . POCT urinalysis dipstick  . EKG 12-Lead    Meds ordered this encounter  Medications  . levothyroxine (SYNTHROID, LEVOTHROID) 88 MCG tablet    Sig: Take 1 tablet (88 mcg total) by mouth daily.    Dispense:  90 tablet    Refill:  3  . almotriptan (AXERT) 6.25 MG tablet    Sig: Take 1 tablet (6.25 mg total) by mouth as needed for migraine. may repeat in 2 hours if needed    Dispense:  27 tablet    Refill:  3  . PARoxetine (PAXIL) 40 MG tablet    Sig: Take 1 tablet (40 mg total) by mouth daily.    Dispense:  90 tablet    Refill:  3  . meloxicam (MOBIC) 15 MG tablet    Sig: Take 1 tablet (15 mg total) by mouth daily. As needed for musculoskeletal pain    Dispense:  90 tablet    Refill:  3  . fluticasone (FLONASE) 50 MCG/ACT nasal spray    Sig: Place 2 sprays into both nostrils at bedtime.    Dispense:  16 g    Refill:  11    Delman Cheadle, M.D.  [facility] [address] [place], [place] [address] [phone] phone [phone] fax  01/10/2016 9:16 PM  Results for orders placed or performed in visit on 12/14/15  CBC  Result Value Ref Range   WBC 4.0 3.8 - 10.8 K/uL   RBC 3.99 3.80 - 5.10 MIL/uL   Hemoglobin 12.3 11.7 - 15.5 g/dL   HCT 36.6 35.0 - 45.0 %   MCV 91.7 80.0 - 100.0 fL  MCH 30.8 27.0 - 33.0 pg   MCHC 33.6 32.0 - 36.0 g/dL   RDW 13.1 11.0 - 15.0 %   Platelets 234 140 - 400 K/uL   MPV 9.7 7.5 - 12.5 fL  HIV antibody  Result Value Ref Range   HIV 1&2 Ab, 4th Generation NONREACTIVE NONREACTIVE  Lipid panel  Result Value Ref Range   Cholesterol 215 (H) 125 - 200 mg/dL   Triglycerides 109 <150 mg/dL   HDL 68 >=46 mg/dL   Total  CHOL/HDL Ratio 3.2 <=5.0 Ratio   VLDL 22 <30 mg/dL   LDL Cholesterol 125 <130 mg/dL  TSH  Result Value Ref Range   TSH 3.57 mIU/L  Comprehensive metabolic panel  Result Value Ref Range   Sodium 139 135 - 146 mmol/L   Potassium 4.0 3.5 - 5.3 mmol/L   Chloride 105 98 - 110 mmol/L   CO2 26 20 - 31 mmol/L   Glucose, Bld 87 65 - 99 mg/dL   BUN 9 7 - 25 mg/dL   Creat 0.74 0.50 - 1.05 mg/dL   Total Bilirubin 0.6 0.2 - 1.2 mg/dL   Alkaline Phosphatase 57 33 - 130 U/L   AST 19 10 - 35 U/L   ALT 20 6 - 29 U/L   Total Protein 5.9 (L) 6.1 - 8.1 g/dL   Albumin 3.9 3.6 - 5.1 g/dL   Calcium 8.7 8.6 - 10.4 mg/dL  VITAMIN D 25 Hydroxy (Vit-D Deficiency, Fractures)  Result Value Ref Range   Vit D, 25-Hydroxy 22 (L) 30 - 100 ng/mL  POCT urinalysis dipstick  Result Value Ref Range   Color, UA yellow yellow   Clarity, UA clear clear   Glucose, UA negative negative   Bilirubin, UA negative negative   Ketones, POC UA negative negative   Spec Grav, UA 1.015    Blood, UA negative negative   pH, UA 8.5    Protein Ur, POC trace (A) negative   Urobilinogen, UA 0.2    Nitrite, UA Negative Negative   Leukocytes, UA Trace (A) Negative

## 2015-12-15 ENCOUNTER — Encounter: Payer: BLUE CROSS/BLUE SHIELD | Admitting: Family Medicine

## 2015-12-15 LAB — HIV ANTIBODY (ROUTINE TESTING W REFLEX): HIV 1&2 Ab, 4th Generation: NONREACTIVE

## 2015-12-15 LAB — VITAMIN D 25 HYDROXY (VIT D DEFICIENCY, FRACTURES): Vit D, 25-Hydroxy: 22 ng/mL — ABNORMAL LOW (ref 30–100)

## 2015-12-27 ENCOUNTER — Telehealth: Payer: Self-pay

## 2015-12-27 DIAGNOSIS — N6310 Unspecified lump in the right breast, unspecified quadrant: Secondary | ICD-10-CM

## 2015-12-27 NOTE — Telephone Encounter (Signed)
Thank you so much Jonelle Sidle.

## 2015-12-27 NOTE — Telephone Encounter (Signed)
Orders were placed in the system for a diagnostic mammogram and ultrasound, but the diagnoses listed are for screening mammogram/annual exam.  Please resubmit the order for a diagnostic mammogram and ultrasound with the problem diagnosis.  The notes in the order indicate the following: "small 1 cm mass in right upper outer quadrant."

## 2015-12-27 NOTE — Telephone Encounter (Signed)
Done

## 2016-06-10 ENCOUNTER — Ambulatory Visit
Admission: RE | Admit: 2016-06-10 | Discharge: 2016-06-10 | Disposition: A | Discharge status: Home / Self Care | Payer: BLUE CROSS/BLUE SHIELD | Source: Ambulatory Visit | Attending: Family Medicine | Admitting: Family Medicine

## 2016-06-10 DIAGNOSIS — N6310 Unspecified lump in the right breast, unspecified quadrant: Secondary | ICD-10-CM

## 2016-11-28 ENCOUNTER — Other Ambulatory Visit: Payer: Self-pay | Admitting: Family Medicine

## 2016-12-23 ENCOUNTER — Other Ambulatory Visit: Payer: Self-pay | Admitting: Family Medicine

## 2016-12-24 NOTE — Telephone Encounter (Signed)
Needs OV before any additional refills.

## 2017-01-02 ENCOUNTER — Encounter: Payer: Self-pay | Admitting: Family Medicine

## 2017-01-02 ENCOUNTER — Ambulatory Visit (INDEPENDENT_AMBULATORY_CARE_PROVIDER_SITE_OTHER): Payer: BLUE CROSS/BLUE SHIELD | Admitting: Family Medicine

## 2017-01-02 VITALS — BP 107/72 | HR 76 | Temp 98.6°F | Resp 18 | Ht 64.0 in | Wt 160.2 lb

## 2017-01-02 DIAGNOSIS — E559 Vitamin D deficiency, unspecified: Secondary | ICD-10-CM

## 2017-01-02 DIAGNOSIS — G43809 Other migraine, not intractable, without status migrainosus: Secondary | ICD-10-CM | POA: Diagnosis not present

## 2017-01-02 DIAGNOSIS — E039 Hypothyroidism, unspecified: Secondary | ICD-10-CM

## 2017-01-02 DIAGNOSIS — Z5181 Encounter for therapeutic drug level monitoring: Secondary | ICD-10-CM | POA: Diagnosis not present

## 2017-01-02 DIAGNOSIS — J3089 Other allergic rhinitis: Secondary | ICD-10-CM | POA: Diagnosis not present

## 2017-01-02 DIAGNOSIS — Z8639 Personal history of other endocrine, nutritional and metabolic disease: Secondary | ICD-10-CM

## 2017-01-02 DIAGNOSIS — Z1231 Encounter for screening mammogram for malignant neoplasm of breast: Secondary | ICD-10-CM | POA: Diagnosis not present

## 2017-01-02 DIAGNOSIS — Z780 Asymptomatic menopausal state: Secondary | ICD-10-CM

## 2017-01-02 DIAGNOSIS — F3342 Major depressive disorder, recurrent, in full remission: Secondary | ICD-10-CM

## 2017-01-02 DIAGNOSIS — Z1239 Encounter for other screening for malignant neoplasm of breast: Secondary | ICD-10-CM

## 2017-01-02 MED ORDER — PAROXETINE HCL 40 MG PO TABS: 40.0000 mg | ORAL_TABLET | Freq: Every day | ORAL | 3 refills | Status: DC

## 2017-01-02 MED ORDER — LIDOCAINE-PRILOCAINE 2.5-2.5 % EX KIT: PACK | Freq: Once | CUTANEOUS | 0 refills | Status: AC

## 2017-01-02 MED ORDER — ALMOTRIPTAN MALATE 6.25 MG PO TABS: 6.2500 mg | ORAL_TABLET | ORAL | 3 refills | Status: DC | PRN

## 2017-01-02 NOTE — Progress Notes (Signed)
Subjective:    Patient ID: Sheri Adkins, female    DOB: [birth year], 61 y.o.   MRN: [id number] Chief Complaint  Patient presents with  . Medication Refill    Levothyroxine and paroxetine     HPI  Sheri Adkins is a delightful 61 year old woman who is here for medication refills. I last saw her 1 year prior for her full physical.  Migraines: almotriptan 6.25. Triggered by seasonal allergies but actually become more infrequent. She does get an aura and so if she treats with the triptan immediately it will abort.  Seasonal allergies: flonase prn- does respond to a a seasonal allergy sinus rinse/needs to do netti pot when she does them. She was on loratadine for a long time but her mother had Alzheimer's so she is very sensitive to anything that might exacerbate her risk of getting Alzheimers.  Hypothyroid: levothyroxine 88. TSH has been within normal range for many years without change.  Mood d/o: paxil 40  Lipid panel last year was much better than prior.  Vitamin D def: taking replacement - occ, trying to eat better and get from food.   History of chronic low back pain treated by daily meloxicam. Helped by yoga. Positive family history of osteoporosis in both her sisters.  Early dexa not done.  Sister with ovarian cancer but she is doing fine now.   Past Medical History:  Diagnosis Date  . Allergy   . Arthritis   . Depression   . Migraine   . Thyroid disease    History reviewed. No pertinent surgical history. Current Outpatient Prescriptions on File Prior to Visit  Medication Sig Dispense Refill  . Calcium Carbonate-Vitamin D (CALCIUM + D PO) Take 1 tablet by mouth daily.     . fluticasone (FLONASE) 50 MCG/ACT nasal spray Place 2 sprays into both nostrils daily. Needs OV before any further refills. 16 g 0  . Multiple Vitamin (MULTIVITAMIN) tablet Take 1 tablet by mouth daily.     No current facility-administered medications on file prior to visit.    Allergies  Allergen  Reactions  . Penicillins     Has patient had a PCN reaction causing immediate rash, facial/tongue/throat swelling, SOB or lightheadedness with hypotension: Yes Has patient had a PCN reaction causing severe rash involving mucus membranes or skin necrosis: No Has patient had a PCN reaction that required hospitalization No Has patient had a PCN reaction occurring within the last 10 years: No If all of the above answers are "NO", then may proceed with Cephalosporin use.   . Sulfa Antibiotics     Rash   Family History  Problem Relation Age of Onset  . Hypertension Mother   . Stroke Mother   . Mental illness Mother   . Cancer Sister        ovarian cancer  . Mental illness Sister   . Cancer Paternal Grandmother        breast  . Heart disease Paternal Grandfather   . Mental illness Brother   . Cancer Maternal Grandmother   . Mental illness Maternal Grandmother   . Mental illness Brother   . Mental illness Sister    Social History   Social History  . Marital status: Single    Spouse name: N/A  . Number of children: N/A  . Years of education: N/A   Occupational History  . 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 Smoking status: Never  Smoker  . Smokeless tobacco: Never Used  . Alcohol use 1.8 oz/week    3 Standard drinks or equivalent per week     Comment: 2 a week  . Drug use: No  . Sexual activity: Not Asked   Other Topics Concern  . None   Social History Narrative   Divorced. No exercise. Education, other   Depression screen [facility] 2/9 01/02/2017 12/14/2015 12/23/2014 07/27/2014 05/12/2014  Decreased Interest 0 0 0 0 0  Down, Depressed, Hopeless 0 0 0 0 0  PHQ - 2 Score 0 0 0 0 0     Review of Systems  Constitutional: Negative for appetite change, chills, diaphoresis and fever.  Eyes: Negative for visual disturbance.  Respiratory: Negative for cough and shortness of breath.   Cardiovascular: Negative for chest pain, palpitations and  leg swelling.  Genitourinary: Negative for decreased urine volume.  Musculoskeletal: Positive for arthralgias. Negative for back pain and gait problem.  Neurological: Negative for syncope and headaches.  Hematological: Does not bruise/bleed easily.  Psychiatric/Behavioral: Negative for dysphoric mood and sleep disturbance. The patient is not nervous/anxious.        Objective:   Physical Exam  Constitutional: She is oriented to person, place, and time. She appears well-developed and well-nourished. No distress.  HENT:  Head: Normocephalic and atraumatic.  Right Ear: External ear normal.  Left Ear: External ear normal.  Eyes: Conjunctivae are normal. No scleral icterus.  Neck: Normal range of motion. Neck supple. No thyromegaly present.  Cardiovascular: Normal rate, regular rhythm, normal heart sounds and intact distal pulses.   Pulmonary/Chest: Effort normal and breath sounds normal. No respiratory distress.  Musculoskeletal: She exhibits no edema.  Lymphadenopathy:    She has no cervical adenopathy.  Neurological: She is alert and oriented to person, place, and time.  Skin: Skin is warm and dry. She is not diaphoretic. No erythema.  Psychiatric: She has a normal mood and affect. Her behavior is normal.      BP 107/72   Pulse 76   Temp 98.6 F (37 C) (Oral)   Resp 18   Ht 5\' 4"  (1.626 m)   Wt 160 lb 3.2 oz (72.7 kg)   SpO2 96%   BMI 27.50 kg/m      Assessment & Plan:  Sched for CPE for 6-12 mos  1. Migraine variants, not intractable  - improving  2. Acquired hypothyroidism - stable on current dose of levothyroxine 88 mcg for many yrs, recheck annually  3. Recurrent major depressive disorder, in full remission ([place]) - doing well, pt wants to maintain on paxil 40 - recheck 1 yr  4. Non-seasonal allergic rhinitis, unspecified trigger - Ok to refill flonase prn x 1 yr  5. Vitamin D deficiency - no currently on any replacement.  6. Medication monitoring encounter   7. H/O  elevated lipids - dramatically improved last yr, recheck  8. Postmenopausal estrogen deficiency - recommend DEXA due to positive family history of osteoporosis in both sister. Last mammogram was November 2017 at the breast center so sched dexa with next mammogram this Nov  9. Encounter for screening breast examination - no family hx but lots of benign cysts and fatty tissue in breasts so freq gets abnml readings - rec cont mam annually and then could cons changing to q19yrs after 61 yo    Orders Placed This Encounter  Procedures  . DG Bone Density    Standing Status:   Future    Standing Expiration Date:  03/04/2018    Scheduling Instructions:     Do in conjunction with mammogram Nov 2018    Order Specific Question:   Reason for Exam (SYMPTOM  OR DIAGNOSIS REQUIRED)    Answer:   post-menopausal estrogen deficiency, strong family hx, vit D def, caucasion.    Order Specific Question:   Is the patient pregnant?    Answer:   No    Order Specific Question:   Preferred imaging location?    Answer:   [facility]  . MM Digital Screening    Standing Status:   Future    Standing Expiration Date:   03/04/2018    Scheduling Instructions:     Due in NOv    Order Specific Question:   Reason for Exam (SYMPTOM  OR DIAGNOSIS REQUIRED)    Answer:   screening    Order Specific Question:   Is the patient pregnant?    Answer:   No    Order Specific Question:   Preferred imaging location?    Answer:   [facility]  . TSH  . VITAMIN D 25 Hydroxy (Vit-D Deficiency, Fractures)  . Comprehensive metabolic panel  . Lipid panel    Order Specific Question:   Has the patient fasted?    Answer:   Yes    Meds ordered this encounter  Medications  . PARoxetine (PAXIL) 40 MG tablet    Sig: Take 1 tablet (40 mg total) by mouth daily.    Dispense:  90 tablet    Refill:  3  . almotriptan (AXERT) 6.25 MG tablet    Sig: Take 1 tablet (6.25 mg total) by mouth as needed for migraine.    Dispense:  27 tablet     Refill:  3  . lidocaine-prilocaine (EMLA) cream    Sig: Apply topically once. 60 min prior to skin tag/mole removal    Dispense:  1 each    Refill:  0     Delman Cheadle, M.D.  Primary Care at [facility] [address] [place],  [address] [phone] phone [phone] fax  01/03/17 10:21 AM

## 2017-01-02 NOTE — Patient Instructions (Addendum)
   IF you received an x-ray today, you will receive an invoice from [facility]. Please contact [facility] at [phone] with questions or concerns regarding your invoice.   IF you received labwork today, you will receive an invoice from [facility]. Please contact [facility] at [phone] with questions or concerns regarding your invoice.   Our billing staff will not be able to assist you with questions regarding bills from these companies.  You will be contacted with the lab results as soon as they are available. The fastest way to get your results is to activate your My Chart account. Instructions are located on the last page of this paperwork. If you have not heard from us regarding the results in 2 weeks, please contact this office.     Bone Health Bones protect organs, store calcium, and anchor muscles. Good health habits, such as eating nutritious foods and exercising regularly, are important for maintaining healthy bones. They can also help to prevent a condition that causes bones to lose density and become weak and brittle (osteoporosis). Why is bone mass important? Bone mass refers to the amount of bone tissue that you have. The higher your bone mass, the stronger your bones. An important step toward having healthy bones throughout life is to have strong and dense bones during childhood. A young adult who has a high bone mass is more likely to have a high bone mass later in life. Bone mass at its greatest it is called peak bone mass. A large decline in bone mass occurs in older adults. In women, it occurs about the time of menopause. During this time, it is important to practice good health habits, because if more bone is lost than what is replaced, the bones will become less healthy and more likely to break (fracture). If you find that you have a low bone mass, you may be able to prevent osteoporosis or further bone loss by changing your diet and lifestyle. How can I  find out if my bone mass is low? Bone mass can be measured with an X-ray test that is called a bone mineral density (BMD) test. This test is recommended for all women who are age 65 or older. It may also be recommended for men who are age 70 or older, or for people who are more likely to develop osteoporosis due to:  Having bones that break easily.  Having a long-term disease that weakens bones, such as kidney disease or rheumatoid arthritis.  Having menopause earlier than normal.  Taking medicine that weakens bones, such as steroids, thyroid hormones, or hormone treatment for breast cancer or prostate cancer.  Smoking.  Drinking three or more alcoholic drinks each day.  What are the nutritional recommendations for healthy bones? To have healthy bones, you need to get enough of the right minerals and vitamins. Most nutrition experts recommend getting these nutrients from the foods that you eat. Nutritional recommendations vary from person to person. Ask your health care provider what is healthy for you. Here are some general guidelines. Calcium Recommendations Calcium is the most important (essential) mineral for bone health. Most people can get enough calcium from their diet, but supplements may be recommended for people who are at risk for osteoporosis. Good sources of calcium include:  Dairy products, such as low-fat or nonfat milk, cheese, and yogurt.  Dark green leafy vegetables, such as bok choy and broccoli.  Calcium-fortified foods, such as orange juice, cereal, bread, soy beverages, and tofu products.  Nuts,   such as almonds.  Follow these recommended amounts for daily calcium intake:  Children, age 1?3: 700 mg.  Children, age 4?8: 1,000 mg.  Children, age 9?13: 1,300 mg.  Teens, age 14?18: 1,300 mg.  Adults, age 19?50: 1,000 mg.  Adults, age 51?70: ? Men: 1,000 mg. ? Women: 1,200 mg.  Adults, age 71 or older: 1,200 mg.  Pregnant and breastfeeding  females: ? Teens: 1,300 mg. ? Adults: 1,000 mg.  Vitamin D Recommendations Vitamin D is the most essential vitamin for bone health. It helps the body to absorb calcium. Sunlight stimulates the skin to make vitamin D, so be sure to get enough sunlight. If you live in a cold climate or you do not get outside often, your health care provider may recommend that you take vitamin D supplements. Good sources of vitamin D in your diet include:  Egg yolks.  Saltwater fish.  Milk and cereal fortified with vitamin D.  Follow these recommended amounts for daily vitamin D intake:  Children and teens, age 1?18: 600 international units.  Adults, age 50 or younger: 400-800 international units.  Adults, age 51 or older: 800-1,000 international units.  Other Nutrients Other nutrients for bone health include:  Phosphorus. This mineral is found in meat, poultry, dairy foods, nuts, and legumes. The recommended daily intake for adult men and adult women is 700 mg.  Magnesium. This mineral is found in seeds, nuts, dark green vegetables, and legumes. The recommended daily intake for adult men is 400?420 mg. For adult women, it is 310?320 mg.  Vitamin K. This vitamin is found in green leafy vegetables. The recommended daily intake is 120 mg for adult men and 90 mg for adult women.  What type of physical activity is best for building and maintaining healthy bones? Weight-bearing and strength-building activities are important for building and maintaining peak bone mass. Weight-bearing activities cause muscles and bones to work against gravity. Strength-building activities increases muscle strength that supports bones. Weight-bearing and muscle-building activities include:  Walking and hiking.  Jogging and running.  Dancing.  Gym exercises.  Lifting weights.  Tennis and racquetball.  Climbing stairs.  Aerobics.  Adults should get at least 30 minutes of moderate physical activity on most days.  Children should get at least 60 minutes of moderate physical activity on most days. Ask your health care provide what type of exercise is best for you. Where can I find more information? For more information, check out the following websites:  National Osteoporosis Foundation: http://nof.org/learn/basics  National Institutes of Health: http://www.niams.nih.gov/Health_Info/Bone/Bone_Health/bone_health_for_life.asp  This information is not intended to replace advice given to you by your health care provider. Make sure you discuss any questions you have with your health care provider. Document Released: 09/21/2003 Document Revised: 01/19/2016 Document Reviewed: 07/06/2014 Elsevier Interactive Patient Education  2018 Elsevier Inc.   Calcium Intake Recommendations Calcium is a mineral that affects many functions in the body, including:  Blood clotting.  Blood vessel function.  Nerve impulse conduction.  Hormone secretion.  Muscle contraction.  Bone and teeth functions.  Most of your body's calcium supply is stored in your bones and teeth. When your calcium stores are low, you may be at risk for low bone mass, bone loss, and bone fractures. Consuming enough calcium helps to grow healthy bones and teeth and to prevent breakdown over time. It is very important that you get enough calcium if you are:  A child undergoing rapid growth.  An adolescent girl.  A pre- or post-menopausal woman.    A woman whose menstrual cycle has stopped due to anorexia nervosa or regular intense exercise.  An individual with lactose intolerance or a milk allergy.  A vegetarian.  What is my plan? Try to consume the recommended amount of calcium daily based on your age. Depending on your overall health, your health care provider may recommend increased calcium intake.General daily calcium intake recommendations by age are:  Birth to 6 months: 200 mg.  Infants 7 to 12 months: 260 mg.  Children 1 to 3  years: 700 mg.  Children 4 to 8 years: 1,000 mg.  Children 9 to 13 years: 1,300 mg.  Teens 14 to 18 years: 1,300 mg.  Adults 19 to 50 years: 1,000 mg.  Adult women 51 to 70 years: 1,200 mg.  Adult men 51 to 70 years: 1,000 mg.  Adults 71 years and older: 1,200 mg.  Pregnant and breastfeeding teens: 1,300 mg.  Pregnant and breastfeeding adults: 1,000 mg.  What do I need to know about calcium intake?  In order for the body to absorb calcium, it needs vitamin D. You can get vitamin D through: ? Direct exposure of the skin to sunlight. ? Foods, such as egg yolks, liver, saltwater fish, and fortified milk. ? Supplements.  Consuming too much calcium may cause: ? Constipation. ? Decreased absorption of iron and zinc. ? Kidney stones.  Calcium supplements may interact with certain medicines. Check with your health care provider before starting any calcium supplements.  Try to get most of your calcium from food. What foods can I eat? Grains  Fortified oatmeal. Fortified ready-to-eat cereals. Fortified frozen waffles. Vegetables Turnip greens. Broccoli. Fruits Fortified orange juice. Meats and Other Protein Sources Canned sardines with bones. Canned salmon with bones. Soy beans. Tofu. Baked beans. Almonds. Brazil nuts. Sunflower seeds. Dairy Milk. Yogurt. Cheese. Cottage cheese. Beverages Fortified soy milk. Fortified rice milk. Sweets/Desserts Pudding. Ice Cream. Milkshakes. Blackstrap molasses. The items listed above may not be a complete list of recommended foods or beverages. Contact your dietitian for more options. What foods can affect my calcium intake? It may be more difficult for your body to use calcium or calcium may leave your body more quickly if you consume large amounts of:  Sodium.  Protein.  Caffeine.  Alcohol.  This information is not intended to replace advice given to you by your health care provider. Make sure you discuss any questions you  have with your health care provider. Document Released: 02/13/2004 Document Revised: 01/19/2016 Document Reviewed: 12/07/2013 Elsevier Interactive Patient Education  2018 Elsevier Inc.   

## 2017-01-03 ENCOUNTER — Encounter: Payer: Self-pay | Admitting: Radiology

## 2017-01-03 LAB — COMPREHENSIVE METABOLIC PANEL
ALBUMIN: 4.2 g/dL (ref 3.6–4.8)
ALT: 22 IU/L (ref 0–32)
AST: 20 IU/L (ref 0–40)
Albumin/Globulin Ratio: 1.9 (ref 1.2–2.2)
Alkaline Phosphatase: 77 IU/L (ref 39–117)
BUN / CREAT RATIO: 12 (ref 12–28)
BUN: 9 mg/dL (ref 8–27)
Bilirubin Total: 0.5 mg/dL (ref 0.0–1.2)
CALCIUM: 9.2 mg/dL (ref 8.7–10.3)
CO2: 22 mmol/L (ref 20–29)
CREATININE: 0.76 mg/dL (ref 0.57–1.00)
Chloride: 104 mmol/L (ref 96–106)
GFR calc Af Amer: 99 mL/min/{1.73_m2} (ref >59)
GFR, EST NON AFRICAN AMERICAN: 86 mL/min/{1.73_m2} (ref >59)
GLUCOSE: 93 mg/dL (ref 65–99)
Globulin, Total: 2.2 g/dL (ref 1.5–4.5)
Potassium: 4.2 mmol/L (ref 3.5–5.2)
SODIUM: 140 mmol/L (ref 134–144)
Total Protein: 6.4 g/dL (ref 6.0–8.5)

## 2017-01-03 LAB — VITAMIN D 25 HYDROXY (VIT D DEFICIENCY, FRACTURES): Vit D, 25-Hydroxy: 18.8 ng/mL — ABNORMAL LOW (ref 30.0–100.0)

## 2017-01-03 LAB — LIPID PANEL
CHOL/HDL RATIO: 3.4 ratio (ref 0.0–4.4)
Cholesterol, Total: 234 mg/dL — ABNORMAL HIGH (ref 100–199)
HDL: 69 mg/dL (ref >39)
LDL CALC: 140 mg/dL — AB (ref 0–99)
TRIGLYCERIDES: 127 mg/dL (ref 0–149)
VLDL CHOLESTEROL CAL: 25 mg/dL (ref 5–40)

## 2017-01-03 LAB — TSH: TSH: 2.32 u[IU]/mL (ref 0.450–4.500)

## 2017-01-03 MED ORDER — VITAMIN D (ERGOCALCIFEROL) 1.25 MG (50000 UNIT) PO CAPS: 50000.0000 [IU] | ORAL_CAPSULE | ORAL | 0 refills | Status: DC

## 2017-01-03 MED ORDER — LEVOTHYROXINE SODIUM 88 MCG PO TABS: 88.0000 ug | ORAL_TABLET | Freq: Every day | ORAL | 3 refills | Status: DC

## 2017-01-10 ENCOUNTER — Telehealth: Payer: Self-pay | Admitting: Family Medicine

## 2017-01-10 NOTE — Telephone Encounter (Signed)
Pt calling about a cream that is to help prepare her for her appt tomorrow for mole removal. Pt was checking in to see if a prescription had been placed. Pt would like this placed to [facility] on [address] in [place]. Please advise.

## 2017-01-11 ENCOUNTER — Encounter: Payer: Self-pay | Admitting: Family Medicine

## 2017-01-11 ENCOUNTER — Ambulatory Visit (INDEPENDENT_AMBULATORY_CARE_PROVIDER_SITE_OTHER): Payer: BLUE CROSS/BLUE SHIELD | Admitting: Family Medicine

## 2017-01-11 VITALS — BP 116/73 | HR 82 | Temp 98.3°F | Resp 16 | Ht 64.0 in | Wt 164.2 lb

## 2017-01-11 DIAGNOSIS — L570 Actinic keratosis: Secondary | ICD-10-CM | POA: Diagnosis not present

## 2017-01-11 DIAGNOSIS — D229 Melanocytic nevi, unspecified: Secondary | ICD-10-CM | POA: Diagnosis not present

## 2017-01-11 NOTE — Progress Notes (Signed)
Subjective:  By signing my name below, I, Sheri Adkins, attest that this documentation has been prepared under the direction and in the presence of Delman Cheadle, MD. Electronically Signed: Moises Adkins, [place]. 01/11/2017 , 11:22 AM .  Patient was seen in Room 1 .   Patient ID: Sheri Adkins, female    DOB: 10/12/1955, 61 y.o.   MRN: [id number] Chief Complaint  Patient presents with  . Nevus    removal right arm    HPI Sheri Adkins is a 61 y.o. female who presents to [facility] Care at [facility] complaining of nevus under her right upper arm. She was last seen a week ago for medication refills. She was prescribed EMLA cream at that visit.   Patient states she didn't receive the EMLA cream at the pharmacy as the prescription wasn't received.   Past Medical History:  Diagnosis Date  . Allergy   . Arthritis   . Depression   . Migraine   . Thyroid disease    Prior to Admission medications   Medication Sig Start Date End Date Taking? Authorizing Provider  almotriptan (AXERT) 6.25 MG tablet Take 1 tablet (6.25 mg total) by mouth as needed for migraine. 01/02/17   Shawnee Knapp, MD  Calcium Carbonate-Vitamin D (CALCIUM + D PO) Take 1 tablet by mouth daily.     [provider]  fluticasone (FLONASE) 50 MCG/ACT nasal spray Place 2 sprays into both nostrils daily. Needs OV before any further refills. 12/24/16   Shawnee Knapp, MD  levothyroxine (SYNTHROID, LEVOTHROID) 88 MCG tablet Take 1 tablet (88 mcg total) by mouth daily. 01/03/17   Shawnee Knapp, MD  Multiple Vitamin (MULTIVITAMIN) tablet Take 1 tablet by mouth daily.    [provider]  PARoxetine (PAXIL) 40 MG tablet Take 1 tablet (40 mg total) by mouth daily. 01/02/17   Shawnee Knapp, MD  Vitamin D, Ergocalciferol, (DRISDOL) 50000 units CAPS capsule Take 1 capsule (50,000 Units total) by mouth every 7 (seven) days. 01/03/17   Shawnee Knapp, MD   Allergies  Allergen Reactions  . Penicillins     Has patient had a PCN reaction causing  immediate rash, facial/tongue/throat swelling, SOB or lightheadedness with hypotension: Yes Has patient had a PCN reaction causing severe rash involving mucus membranes or skin necrosis: No Has patient had a PCN reaction that required hospitalization No Has patient had a PCN reaction occurring within the last 10 years: No If all of the above answers are "NO", then may proceed with Cephalosporin use.   . Sulfa Antibiotics     Rash    Review of Systems  Skin:       Nevus - right upper arm       Objective:   Physical Exam  Constitutional: She is oriented to person, place, and time. She appears well-developed and well-nourished. No distress.  HENT:  Head: Normocephalic and atraumatic.  Eyes: EOM are normal. Pupils are equal, round, and reactive to light.  Neck: Neck supple.  Cardiovascular: Normal rate.   Pulmonary/Chest: Effort normal. No respiratory distress.  Musculoskeletal: Normal range of motion.  Neurological: She is alert and oriented to person, place, and time.  Skin: Skin is warm and dry.  Psychiatric: She has a normal mood and affect. Her behavior is normal.  Nursing note and vitals reviewed.   BP 116/73   Pulse 82   Temp 98.3 F (36.8 C) (Oral)   Resp 16   Ht 5\' 4"  (1.626 m)  Wt 164 lb 3.2 oz (74.5 kg)   SpO2 92%   BMI 28.18 kg/m       Assessment & Plan:   1. Nevus   2. Actinic keratosis   Punch biopsy to nevus at medial/flexor aspect of right upper arm tolerated well. Cryo  RTC 1 wk to remove single stitch - fast track but see me - will hopefully have path back by then  Orders Placed This Encounter  Procedures  . Care order/instruction:    AVS printed - let patient go!      I personally performed the services described in this documentation, which was scribed in my presence. The recorded information has been reviewed and considered, and addended by me as needed.   Delman Cheadle, M.D.  Primary Care at [facility] [address] [place], [place] [address] [phone] phone [phone] fax  01/13/17 12:11 AM

## 2017-01-11 NOTE — Patient Instructions (Addendum)
RTC in 6-7 days for suture removal - you do not need to schedule an appointment in advance for this.    IF you received an x-ray today, you will receive an invoice from [facility]. Please contact [facility] at [phone] with questions or concerns regarding your invoice.   IF you received labwork today, you will receive an invoice from [facility]. Please contact [facility] at [phone] with questions or concerns regarding your invoice.   Our billing staff will not be able to assist you with questions regarding bills from these companies.  You will be contacted with the lab results as soon as they are available. The fastest way to get your results is to activate your My Chart account. Instructions are located on the last page of this paperwork. If you have not heard from [place] regarding the results in 2 weeks, please contact this office.     Skin Biopsy, Care After Refer to this sheet in the next few weeks. These instructions provide you with information about caring for yourself after your procedure. Your health care provider may also give you more specific instructions. Your treatment has been planned according to current medical practices, but problems sometimes occur. Call your health care provider if you have any problems or questions after your procedure. What can I expect after the procedure? After the procedure, it is common to have:  Soreness.  Bruising.  Itching.  Follow these instructions at home:  Rest and then return to your normal activities as told by your health care provider.  Take over-the-counter and prescription medicines only as told by your health care provider.  Follow instructions from your health care provider about how to take care of your biopsy site.Make sure you: ? Wash your hands with soap and water before you change your bandage (dressing). If soap and water are not available, use hand sanitizer. ? Change your dressing as told by your  health care provider. ? Leave stitches (sutures), skin glue, or adhesive strips in place. These skin closures may need to stay in place for 2 weeks or longer. If adhesive strip edges start to loosen and curl up, you may trim the loose edges. Do not remove adhesive strips completely unless your health care provider tells you to do that. If the biopsy area bleeds, apply gentle pressure for 10 minutes.  Check your biopsy site every day for signs of infection. Check for: ? More redness, swelling, or pain. ? More fluid or blood. ? Warmth. ? Pus or a bad smell.  Keep all follow-up visits as told by your health care provider. This is important. Contact a health care provider if:  You have more redness, swelling, or pain around your biopsy site.  You have more fluid or blood coming from your biopsy site.  Your biopsy site feels warm to the touch.  You have pus or a bad smell coming from your biopsy site.  You have a fever. Get help right away if:  You have bleeding that does not stop with pressure or a dressing. This information is not intended to replace advice given to you by your health care provider. Make sure you discuss any questions you have with your health care provider. Document Released: 07/28/2015 Document Revised: 02/25/2016 Document Reviewed: 09/28/2014 Elsevier Interactive Patient Education  2018 [employer].   Skin Biopsy A skin biopsy is a procedure to remove a sample of your skin (lesion) so it can be checked under a microscope. You may need a skin  biopsy if you have a skin disease or abnormal changes in your skin. Tell a health care provider about:  Any allergies you have.  All medicines you are taking, including vitamins, herbs, eye drops, creams, and over-the-counter medicines.  Any problems you or family members have had with anesthetic medicines.  Any blood disorders you have.  Any surgeries you have had.  Any medical conditions you have. What are the  risks? Generally, this is a safe procedure. However, problems may occur, including:  Infection.  Bleeding.  Allergic reaction to medicines.  Scarring.  What happens before the procedure?  Ask your health care provider about changing or stopping your regular medicines. This is especially important if you are taking diabetes medicines or blood thinners.  Follow instructions from your health care provider about how to care for your skin.  Ask your health care provider how your biopsy site will be marked or identified.  You may be given antibiotic medicine to prevent infection. What happens during the procedure?  To reduce your risk of infection: ? Your health care team will wash or sanitize their hands. ? Your skin will be washed with soap.  You may be given a medicine to help you relax (sedative).  You will be givena medicine to numb the area (local anesthetic).  The method that your health care provider will use for your skin biopsy will depend on the type of skin problem you have. Options include: ? Shave biopsy. Your health care provider will shave away layers of your skin lesion with a sharp blade. After shaving, a gel or ointment may be used to control bleeding. ? Punch biopsy. Your health care provider will use a tool to remove all or part of the lesion. This leaves a small hole about the width of a pencil eraser. The area may be covered with a gel or ointment. ? Excisional or incisional biopsy. Your health care provider will use a surgical blade to remove all or part of your lesion.  Your skin biopsy site may be closed with stitches (sutures).  A bandage (dressing) will be applied. The procedure may vary among health care providers and hospitals. What happens after the procedure?  Your skin sample will be sent to a laboratory for examination.  Your skin biopsy site will be watched to make sure that it stops bleeding.  Do not drive for 24 hours if you received a  sedative. This information is not intended to replace advice given to you by your health care provider. Make sure you discuss any questions you have with your health care provider. Document Released: 08/02/2004 Document Revised: 02/25/2016 Document Reviewed: 09/28/2014 Elsevier Interactive Patient Education  [employer].

## 2017-01-14 NOTE — Telephone Encounter (Signed)
Patient was seen Saturday, 01/11/2017

## 2017-01-17 ENCOUNTER — Encounter: Payer: Self-pay | Admitting: Family Medicine

## 2017-01-17 ENCOUNTER — Ambulatory Visit (INDEPENDENT_AMBULATORY_CARE_PROVIDER_SITE_OTHER): Payer: BLUE CROSS/BLUE SHIELD | Admitting: Family Medicine

## 2017-01-17 VITALS — BP 94/59 | HR 96 | Temp 98.4°F | Resp 16 | Ht 64.0 in | Wt 162.0 lb

## 2017-01-17 DIAGNOSIS — Z4802 Encounter for removal of sutures: Secondary | ICD-10-CM

## 2017-01-17 NOTE — Patient Instructions (Addendum)
     IF you received an x-ray today, you will receive an invoice from [facility]. Please contact [facility] at [phone] with questions or concerns regarding your invoice.   IF you received labwork today, you will receive an invoice from [facility]. Please contact [facility] at [phone] with questions or concerns regarding your invoice.   Our billing staff will not be able to assist you with questions regarding bills from these companies.  You will be contacted with the lab results as soon as they are available. The fastest way to get your results is to activate your My Chart account. Instructions are located on the last page of this paperwork. If you have not heard from us regarding the results in 2 weeks, please contact this office.    Seborrheic Keratosis Seborrheic keratosis is a common, noncancerous (benign) skin growth. This condition causes waxy, rough, tan, brown, or black spots to appear on the skin. These skin growths can be flat or raised. What are the causes? The cause of this condition is not known. What increases the risk? This condition is more likely to develop in:  People who have a family history of seborrheic keratosis.  People who are 50 or older.  People who are pregnant.  People who have had estrogen replacement therapy.  What are the signs or symptoms? This condition often occurs on the face, chest, shoulders, back, or other areas. These growths:  Are usually painless, but may become irritated and itchy.  Can be yellow, brown, black, or other colors.  Are slightly raised or have a flat surface.  Are sometimes rough or wart-like in texture.  Are often waxy on the surface.  Are round or oval-shaped.  Sometimes look like they are "stuck on."  Often occur in groups, but may occur as a single growth.  How is this diagnosed? This condition is diagnosed with a medical history and physical exam. A sample of the growth may be tested  (skin biopsy). You may need to see a skin specialist (dermatologist). How is this treated? Treatment is not usually needed for this condition, unless the growths are irritated or are often bleeding. You may also choose to have the growths removed if you do not like their appearance. Most commonly, these growths are treated with a procedure in which liquid nitrogen is applied to "freeze" off the growth (cryosurgery). They may also be burned off with electricity or cut off. Follow these instructions at home:  Watch your growth for any changes.  Keep all follow-up visits as told by your health care provider. This is important.  Do not scratch or pick at the growth or growths. This can cause them to become irritated or infected. Contact a health care provider if:  You suddenly have many new growths.  Your growth bleeds, itches, or hurts.  Your growth suddenly becomes larger or changes color. This information is not intended to replace advice given to you by your health care provider. Make sure you discuss any questions you have with your health care provider. Document Released: 08/03/2010 Document Revised: 12/07/2015 Document Reviewed: 11/16/2014 Elsevier Interactive Patient Education  2017 Elsevier Inc.  

## 2017-01-18 ENCOUNTER — Ambulatory Visit: Payer: BLUE CROSS/BLUE SHIELD | Admitting: Family Medicine

## 2017-01-20 NOTE — Progress Notes (Signed)
Pt here for suture removal performed by nursing. Pt informed of biopsy results. No questions. Wound healing well.

## 2017-04-09 ENCOUNTER — Other Ambulatory Visit: Payer: Self-pay | Admitting: Family Medicine

## 2017-04-13 ENCOUNTER — Other Ambulatory Visit: Payer: Self-pay | Admitting: Family Medicine

## 2017-04-13 MED ORDER — FLUTICASONE PROPIONATE 50 MCG/ACT NA SUSP: 2.0000 | Freq: Every day | NASAL | 5 refills | Status: DC

## 2017-04-13 NOTE — Progress Notes (Signed)
Subjective:    Patient ID: Sheri Adkins, female    DOB: 1956/04/30, 61 y.o.   MRN: [id number] No chief complaint on file.   HPI  Sheri Adkins is a delightful 61 year old woman who is here for medication refills. I last saw her 3 mos prior for the same and rec she RTC in 6-12 mos for her CPE.  Migraines: almotriptan 6.25. Triggered by seasonal allergies but actually become more infrequent. She does get an aura and so if she treats with the triptan immediately, it will abort.  Seasonal allergies: flonase prn- does respond to a a seasonal allergy sinus rinse/netti pot. She was on loratadine for yrs but stopped oral antihistamines in 2017 due to report of long-term use increasing dementia risk - esp as her mother had Alzheimers.  Hypothyroid: levothyroxine 88. TSH has been within normal range for many years so checking annually. Lab Results  Component Value Date   TSH 2.320 01/02/2017   Mood d/o: paxil 40, in remission so wants to maintain same regimen.  H/o HLD: Trying to work on tlc as no other cardiac risk factors. On 01/02/2017 10 yr ASCVD risk 2.3% with optimal risk of 2% Lab Results  Component Value Date   LDLCALC 140 (H) 01/02/2017   LDLCALC 125 12/14/2015   LDLCALC 158 (H) 07/27/2014   Vitamin D def: 3 mos prior started on high dose once weekly supplement x 6 mos, then change to vit D 2000-5000u otc qd supp. Lab Results  Component Value Date   VD25OH 18.8 (L) 01/02/2017   VD25OH 22 (L) 12/14/2015   VD25OH 29 (L) 07/27/2014    History of chronic low back pain treated by daily meloxicam. Helped by yoga. Positive family history of osteoporosis in both her sisters. Rec early dexa this yr Sister with ovarian cancer but she is doing fine now.   Past Medical History:  Diagnosis Date  . Allergy   . Arthritis   . Depression   . Migraine   . Thyroid disease    No past surgical history on file. Current Outpatient Prescriptions on File Prior to Visit  Medication Sig Dispense  Refill  . almotriptan (AXERT) 6.25 MG tablet Take 1 tablet (6.25 mg total) by mouth as needed for migraine. 27 tablet 3  . Calcium Carbonate-Vitamin D (CALCIUM + D PO) Take 1 tablet by mouth daily.     . fluticasone (FLONASE) 50 MCG/ACT nasal spray Place 2 sprays into both nostrils daily. Needs OV before any further refills. 16 g 0  . levothyroxine (SYNTHROID, LEVOTHROID) 88 MCG tablet Take 1 tablet (88 mcg total) by mouth daily. 90 tablet 3  . Multiple Vitamin (MULTIVITAMIN) tablet Take 1 tablet by mouth daily.    Marland Kitchen PARoxetine (PAXIL) 40 MG tablet Take 1 tablet (40 mg total) by mouth daily. 90 tablet 3  . Vitamin D, Ergocalciferol, (DRISDOL) 50000 units CAPS capsule Take 1 capsule (50,000 Units total) by mouth every 7 (seven) days. 24 capsule 0   No current facility-administered medications on file prior to visit.    Allergies  Allergen Reactions  . Penicillins     Has patient had a PCN reaction causing immediate rash, facial/tongue/throat swelling, SOB or lightheadedness with hypotension: Yes Has patient had a PCN reaction causing severe rash involving mucus membranes or skin necrosis: No Has patient had a PCN reaction that required hospitalization No Has patient had a PCN reaction occurring within the last 10 years: No If all of the above answers are "NO", then  may proceed with Cephalosporin use.   . Sulfa Antibiotics     Rash   Family History  Problem Relation Age of Onset  . Hypertension Mother   . Stroke Mother   . Mental illness Mother   . Cancer Sister        ovarian cancer  . Mental illness Sister   . Cancer Paternal Grandmother        breast  . Heart disease Paternal Grandfather   . Mental illness Brother   . Cancer Maternal Grandmother   . Mental illness Maternal Grandmother   . Mental illness Brother   . Mental illness Sister    Social History   Social History  . Marital status: Single    Spouse name: N/A  . Number of children: N/A  . Years of education: N/A     Occupational History  . 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 Smoking status: Never Smoker  . Smokeless tobacco: Never Used  . Alcohol use 1.8 oz/week    3 Standard drinks or equivalent per week     Comment: 2 a week  . Drug use: No  . Sexual activity: Not on file   Other Topics Concern  . Not on file   Social History Narrative   Divorced. No exercise. Education, other   Depression screen [facility] 2/9 01/17/2017 01/11/2017 01/02/2017 12/14/2015 12/23/2014  Decreased Interest 0 0 0 0 0  Down, Depressed, Hopeless 0 0 0 0 0  PHQ - 2 Score 0 0 0 0 0     Review of Systems  Constitutional: Negative for appetite change, chills, diaphoresis and fever.  Eyes: Negative for visual disturbance.  Respiratory: Negative for cough and shortness of breath.   Cardiovascular: Negative for chest pain, palpitations and leg swelling.  Genitourinary: Negative for decreased urine volume.  Musculoskeletal: Positive for arthralgias. Negative for back pain and gait problem.  Neurological: Negative for syncope and headaches.  Hematological: Does not bruise/bleed easily.  Psychiatric/Behavioral: Negative for dysphoric mood and sleep disturbance. The patient is not nervous/anxious.        Objective:   Physical Exam  Constitutional: She is oriented to person, place, and time. She appears well-developed and well-nourished. No distress.  HENT:  Head: Normocephalic and atraumatic.  Right Ear: External ear normal.  Left Ear: External ear normal.  Eyes: Conjunctivae are normal. No scleral icterus.  Neck: Normal range of motion. Neck supple. No thyromegaly present.  Cardiovascular: Normal rate, regular rhythm, normal heart sounds and intact distal pulses.   Pulmonary/Chest: Effort normal and breath sounds normal. No respiratory distress.  Musculoskeletal: She exhibits no edema.  Lymphadenopathy:    She has no cervical adenopathy.  Neurological: She is alert and oriented to  person, place, and time.  Skin: Skin is warm and dry. She is not diaphoretic. No erythema.  Psychiatric: She has a normal mood and affect. Her behavior is normal.      There were no vitals taken for this visit.     Assessment & Plan:  Last CPE 12/14/2015: Sched for CPE GET DEXA w/ mammogram this Nov at [facility] (none prior but +FHx)  1. Need for prophylactic vaccination and inoculation against influenza     Orders Placed This Encounter  Procedures  . Flu Vaccine QUAD 36+ mos IM    Meds ordered this encounter  Medications  . DISCONTD: PARoxetine (PAXIL) 40 MG tablet    Sig:  Take 1 tablet (40 mg total) by mouth daily.    Dispense:  90 tablet    Refill:  2  . levothyroxine (SYNTHROID, LEVOTHROID) 88 MCG tablet    Sig: Take 1 tablet (88 mcg total) by mouth daily.    Dispense:  90 tablet    Refill:  2  . almotriptan (AXERT) 6.25 MG tablet    Sig: Take 1 tablet (6.25 mg total) by mouth as needed for migraine.    Dispense:  27 tablet    Refill:  2  . PARoxetine (PAXIL) 40 MG tablet    Sig: Take 1 tablet (40 mg total) by mouth daily.    Dispense:  30 tablet    Refill:  0   Delman Cheadle, M.D.  Primary Care at [facility] [address] [place], [place] [address] [phone] phone [phone] fax  04/13/17 10:18 PM

## 2017-04-14 ENCOUNTER — Encounter: Payer: Self-pay | Admitting: Family Medicine

## 2017-04-14 ENCOUNTER — Other Ambulatory Visit: Payer: Self-pay | Admitting: Family Medicine

## 2017-04-14 ENCOUNTER — Ambulatory Visit (INDEPENDENT_AMBULATORY_CARE_PROVIDER_SITE_OTHER): Payer: BLUE CROSS/BLUE SHIELD | Admitting: Family Medicine

## 2017-04-14 VITALS — BP 92/68 | HR 89 | Temp 98.4°F | Resp 18 | Ht 63.78 in | Wt 160.4 lb

## 2017-04-14 DIAGNOSIS — Z23 Encounter for immunization: Secondary | ICD-10-CM | POA: Diagnosis not present

## 2017-04-14 MED ORDER — PAROXETINE HCL 40 MG PO TABS: 40.0000 mg | ORAL_TABLET | Freq: Every day | ORAL | 2 refills | Status: DC

## 2017-04-14 MED ORDER — PAROXETINE HCL 40 MG PO TABS: 40.0000 mg | ORAL_TABLET | Freq: Every day | ORAL | 0 refills | Status: DC

## 2017-04-14 MED ORDER — ALMOTRIPTAN MALATE 6.25 MG PO TABS: 6.2500 mg | ORAL_TABLET | ORAL | 2 refills | Status: DC | PRN

## 2017-04-14 MED ORDER — LEVOTHYROXINE SODIUM 88 MCG PO TABS: 88.0000 ug | ORAL_TABLET | Freq: Every day | ORAL | 2 refills | Status: DC

## 2017-04-14 NOTE — Patient Instructions (Signed)
     IF you received an x-ray today, you will receive an invoice from [facility]. Please contact [facility] at [phone] with questions or concerns regarding your invoice.   IF you received labwork today, you will receive an invoice from [facility]. Please contact [facility] at [phone] with questions or concerns regarding your invoice.   Our billing staff will not be able to assist you with questions regarding bills from these companies.  You will be contacted with the lab results as soon as they are available. The fastest way to get your results is to activate your My Chart account. Instructions are located on the last page of this paperwork. If you have not heard from us regarding the results in 2 weeks, please contact this office.     

## 2017-06-12 ENCOUNTER — Ambulatory Visit
Admission: RE | Admit: 2017-06-12 | Discharge: 2017-06-12 | Disposition: A | Discharge status: Home / Self Care | Payer: BLUE CROSS/BLUE SHIELD | Source: Ambulatory Visit | Attending: Family Medicine | Admitting: Family Medicine

## 2017-06-12 DIAGNOSIS — Z780 Asymptomatic menopausal state: Secondary | ICD-10-CM

## 2017-06-12 DIAGNOSIS — Z1239 Encounter for other screening for malignant neoplasm of breast: Secondary | ICD-10-CM

## 2017-08-07 ENCOUNTER — Ambulatory Visit (INDEPENDENT_AMBULATORY_CARE_PROVIDER_SITE_OTHER): Payer: Commercial Managed Care - PPO | Admitting: Family Medicine

## 2017-08-07 ENCOUNTER — Encounter: Payer: Self-pay | Admitting: Family Medicine

## 2017-08-07 ENCOUNTER — Other Ambulatory Visit: Payer: Self-pay

## 2017-08-07 VITALS — BP 108/60 | HR 90 | Temp 98.3°F | Resp 18 | Ht 63.78 in | Wt 162.8 lb

## 2017-08-07 DIAGNOSIS — E559 Vitamin D deficiency, unspecified: Secondary | ICD-10-CM

## 2017-08-07 DIAGNOSIS — E039 Hypothyroidism, unspecified: Secondary | ICD-10-CM

## 2017-08-07 DIAGNOSIS — M85851 Other specified disorders of bone density and structure, right thigh: Secondary | ICD-10-CM

## 2017-08-07 MED ORDER — PAROXETINE HCL 40 MG PO TABS: 40.0000 mg | ORAL_TABLET | Freq: Every day | ORAL | 3 refills | Status: DC

## 2017-08-07 MED ORDER — VITAMIN D (ERGOCALCIFEROL) 1.25 MG (50000 UNIT) PO CAPS: 50000.0000 [IU] | ORAL_CAPSULE | ORAL | 1 refills | Status: DC

## 2017-08-07 NOTE — Patient Instructions (Addendum)
IF you received an x-ray today, you will receive an invoice from [facility]. Please contact [facility] at [phone] with questions or concerns regarding your invoice.   IF you received labwork today, you will receive an invoice from [facility]. Please contact [facility] at [phone] with questions or concerns regarding your invoice.   Our billing staff will not be able to assist you with questions regarding bills from these companies.  You will be contacted with the lab results as soon as they are available. The fastest way to get your results is to activate your My Chart account. Instructions are located on the last page of this paperwork. If you have not heard from [place] regarding the results in 2 weeks, please contact this office.     Bone Health Bones protect organs, store calcium, and anchor muscles. Good health habits, such as eating nutritious foods and exercising regularly, are important for maintaining healthy bones. They can also help to prevent a condition that causes bones to lose density and become weak and brittle (osteoporosis). Why is bone mass important? Bone mass refers to the amount of bone tissue that you have. The higher your bone mass, the stronger your bones. An important step toward having healthy bones throughout life is to have strong and dense bones during childhood. A young adult who has a high bone mass is more likely to have a high bone mass later in life. Bone mass at its greatest it is called peak bone mass. A large decline in bone mass occurs in older adults. In women, it occurs about the time of menopause. During this time, it is important to practice good health habits, because if more bone is lost than what is replaced, the bones will become less healthy and more likely to break (fracture). If you find that you have a low bone mass, you may be able to prevent osteoporosis or further bone loss by changing your diet and lifestyle. How can I  find out if my bone mass is low? Bone mass can be measured with an X-ray test that is called a bone mineral density (BMD) test. This test is recommended for all women who are age 63 or older. It may also be recommended for men who are age 32 or older, or for people who are more likely to develop osteoporosis due to:  Having bones that break easily.  Having a long-term disease that weakens bones, such as kidney disease or rheumatoid arthritis.  Having menopause earlier than normal.  Taking medicine that weakens bones, such as steroids, thyroid hormones, or hormone treatment for breast cancer or prostate cancer.  Smoking.  Drinking three or more alcoholic drinks each day.  What are the nutritional recommendations for healthy bones? To have healthy bones, you need to get enough of the right minerals and vitamins. Most nutrition experts recommend getting these nutrients from the foods that you eat. Nutritional recommendations vary from person to person. Ask your health care provider what is healthy for you. Here are some general guidelines. Calcium Recommendations Calcium is the most important (essential) mineral for bone health. Most people can get enough calcium from their diet, but supplements may be recommended for people who are at risk for osteoporosis. Good sources of calcium include:  Dairy products, such as low-fat or nonfat milk, cheese, and yogurt.  Dark green leafy vegetables, such as bok choy and broccoli.  Calcium-fortified foods, such as orange juice, cereal, bread, soy beverages, and tofu products.  Nuts,  such as almonds.  Follow these recommended amounts for daily calcium intake:  Children, age 69?3: 700 mg.  Children, age 64?8: 1,000 mg.  Children, age 84?13: 1,300 mg.  Teens, age 692?18: 1,300 mg.  Adults, age 79?50: 1,000 mg.  Adults, age 38?70: ? Men: 1,000 mg. ? Women: 1,200 mg.  Adults, age 642 or older: 1,200 mg.  Pregnant and breastfeeding  females: ? Teens: 1,300 mg. ? Adults: 1,000 mg.  Vitamin D Recommendations Vitamin D is the most essential vitamin for bone health. It helps the body to absorb calcium. Sunlight stimulates the skin to make vitamin D, so be sure to get enough sunlight. If you live in a cold climate or you do not get outside often, your health care provider may recommend that you take vitamin D supplements. Good sources of vitamin D in your diet include:  Egg yolks.  Saltwater fish.  Milk and cereal fortified with vitamin D.  Follow these recommended amounts for daily vitamin D intake:  Children and teens, age 78?18: 47 international units.  Adults, age 62 or younger: 400-800 international units.  Adults, age 36 or older: 800-1,000 international units.  Other Nutrients Other nutrients for bone health include:  Phosphorus. This mineral is found in meat, poultry, dairy foods, nuts, and legumes. The recommended daily intake for adult men and adult women is 700 mg.  Magnesium. This mineral is found in seeds, nuts, dark green vegetables, and legumes. The recommended daily intake for adult men is 400?420 mg. For adult women, it is 310?320 mg.  Vitamin K. This vitamin is found in green leafy vegetables. The recommended daily intake is 120 mg for adult men and 90 mg for adult women.  What type of physical activity is best for building and maintaining healthy bones? Weight-bearing and strength-building activities are important for building and maintaining peak bone mass. Weight-bearing activities cause muscles and bones to work against gravity. Strength-building activities increases muscle strength that supports bones. Weight-bearing and muscle-building activities include:  Walking and hiking.  Jogging and running.  Dancing.  Gym exercises.  Lifting weights.  Tennis and racquetball.  Climbing stairs.  Aerobics.  Adults should get at least 30 minutes of moderate physical activity on most days.  Children should get at least 60 minutes of moderate physical activity on most days. Ask your health care provide what type of exercise is best for you. Where can I find more information? For more information, check out the following websites:  [place]: YardHomes.se  [employer] of Health: http://www.niams.AnonymousEar.fr.asp  This information is not intended to replace advice given to you by your health care provider. Make sure you discuss any questions you have with your health care provider. Document Released: 09/21/2003 Document Revised: 01/19/2016 Document Reviewed: 07/06/2014 Elsevier Interactive Patient Education  2018 [place].  Calcium Intake Recommendations Calcium is a mineral that affects many functions in the body, including:  Blood clotting.  Blood vessel function.  Nerve impulse conduction.  Hormone secretion.  Muscle contraction.  Bone and teeth functions.  Most of your body's calcium supply is stored in your bones and teeth. When your calcium stores are low, you may be at risk for low bone mass, bone loss, and bone fractures. Consuming enough calcium helps to grow healthy bones and teeth and to prevent breakdown over time. It is very important that you get enough calcium if you are:  A child undergoing rapid growth.  An adolescent girl.  A pre- or post-menopausal woman.  A  woman whose menstrual cycle has stopped due to anorexia nervosa or regular intense exercise.  An individual with lactose intolerance or a milk allergy.  A vegetarian.  What is my plan? Try to consume the recommended amount of calcium daily based on your age. Depending on your overall health, your health care provider may recommend increased calcium intake.General daily calcium intake recommendations by age are:  Birth to 6 months: 200 mg.  Infants 7 to 12 months: 260 mg.  Children 1 to 3  years: 700 mg.  Children 4 to 8 years: 1,000 mg.  Children 9 to 13 years: 1,300 mg.  Teens 14 to 18 years: 1,300 mg.  Adults 19 to 50 years: 1,000 mg.  Adult women 51 to 70 years: 1,200 mg.  Adult men 51 to 70 years: 1,000 mg.  Adults 71 years and older: 1,200 mg.  Pregnant and breastfeeding teens: 1,300 mg.  Pregnant and breastfeeding adults: 1,000 mg.  What do I need to know about calcium intake?  In order for the body to absorb calcium, it needs vitamin D. You can get vitamin D through: ? Direct exposure of the skin to sunlight. ? Foods, such as egg yolks, liver, saltwater fish, and fortified milk. ? Supplements.  Consuming too much calcium may cause: ? Constipation. ? Decreased absorption of iron and zinc. ? Kidney stones.  Calcium supplements may interact with certain medicines. Check with your health care provider before starting any calcium supplements.  Try to get most of your calcium from food. What foods can I eat? Grains  Fortified oatmeal. Fortified ready-to-eat cereals. Fortified frozen waffles. Vegetables Turnip greens. Broccoli. Fruits Fortified orange juice. Meats and Other Protein Sources Canned sardines with bones. Canned salmon with bones. Soy beans. Tofu. Baked beans. Almonds. [place] nuts. Sunflower seeds. Dairy Milk. Yogurt. Cheese. Cottage cheese. Beverages Fortified soy milk. Fortified rice milk. Sweets/Desserts Pudding. Ice Cream. Milkshakes. Blackstrap molasses. The items listed above may not be a complete list of recommended foods or beverages. Contact your dietitian for more options. What foods can affect my calcium intake? It may be more difficult for your body to use calcium or calcium may leave your body more quickly if you consume large amounts of:  Sodium.  Protein.  Caffeine.  Alcohol.  This information is not intended to replace advice given to you by your health care provider. Make sure you discuss any questions you  have with your health care provider. Document Released: 02/13/2004 Document Revised: 01/19/2016 Document Reviewed: 12/07/2013 Elsevier Interactive Patient Education  2018 [employer].  Preventing Osteoporosis, Adult Osteoporosis is a condition that causes the bones to get weaker. With osteoporosis, the bones become thinner, and the normal spaces in bone tissue become larger. This can make the bones weak and cause them to break more easily. People who have osteoporosis are more likely to break their wrist, spine, or hip. Even a minor accident or injury can be enough to break weak bones. Osteoporosis can occur with aging. Your body constantly replaces old bone tissue with new tissue. As you get older, you may lose bone tissue more quickly, or it may be replaced more slowly. Osteoporosis is more likely to develop if you have poor nutrition or do not get enough calcium or vitamin D. Other lifestyle factors can also play a role. By making some diet and lifestyle changes, you can help to keep your bones healthy and help to prevent osteoporosis. What nutrition changes can be made? Nutrition plays an important role in maintaining  healthy, strong bones.  Make sure you get enough calcium every day from food or from calcium supplements. ? If you are age 54 or younger, aim to get 1,000 mg of calcium every day. ? If you are older than age 58, aim to get 1,200 mg of calcium every day.  Try to get enough vitamin D every day. ? If you are age 17 or younger, aim to get 600 international units (IU) every day. ? If you are older than age 23, aim to get 800 international units every day.  Follow a healthy diet. Eat plenty of foods that contain calcium and vitamin D. ? Calcium is in milk, cheese, yogurt, and other dairy products. Some fish and vegetables are also good sources of calcium. Many foods such as cereals and breads have had calcium added to them (are fortified). Check nutrition labels to see how much  calcium is in a food or drink. ? Foods that contain vitamin D include milk, cereals, salmon, and tuna. Your body also makes vitamin D when you are out in the sun. Bare skin exposure to the sun on your face, arms, legs, or back for no more than 30 minutes a day, 2 times per week is more than enough. Beyond that, it is important to use sunblock to protect your skin from sunburn, which increases your risk for skin cancer.  What lifestyle changes can be made? Making changes in your everyday life can also play an important role in preventing osteoporosis.  Stay active and get exercise every day. Ask your health care provider what types of exercise are best for you.  Do not use any products that contain nicotine or tobacco, such as cigarettes and e-cigarettes. If you need help quitting, ask your health care provider.  Limit alcohol intake to no more than 1 drink a day for nonpregnant women and 2 drinks a day for men. One drink equals 12 oz of beer, 5 oz of wine, or 1 oz of hard liquor.  Why are these changes important? Making these nutrition and lifestyle changes can:  Help you develop and maintain healthy, strong bones.  Prevent loss of bone mass and the problems that are caused by that loss, such as broken bones and delayed healing.  Make you feel better mentally and physically.  What can happen if changes are not made? Problems that can result from osteoporosis can be very serious. These may include:  A higher risk of broken bones that are painful and do not heal well.  Physical malformations, such as a collapsed spine or a hunched back.  Problems with movement.  Where to find support: If you need help making changes to prevent osteoporosis, talk with your health care provider. You can ask for a referral to a diet and nutrition specialist (dietitian) and a physical therapist. Where to find more information: Learn more about osteoporosis from:  NIH Osteoporosis and Related [place]: www.niams.GolfingGoddess.com.br  U.S. Office on Women's Health: SouvenirBaseball.es.html  National Osteoporosis Foundation: ProfilePeek.ch  Summary  Osteoporosis is a condition that causes weak bones that are more likely to break.  Eating a healthy diet and making sure you get enough calcium and vitamin D can help prevent osteoporosis.  Other ways to reduce your risk of osteoporosis include getting regular exercise and avoiding alcohol and products that contain nicotine or tobacco. This information is not intended to replace advice given to you by your health care provider. Make sure you discuss any questions  you have with your health care provider. Document Released: 07/16/2015 Document Revised: 03/11/2016 Document Reviewed: 03/11/2016 Elsevier Interactive Patient Education  [employer].

## 2017-08-07 NOTE — Progress Notes (Signed)
Subjective:    Patient ID: Sheri Adkins, female    DOB: 29-Sep-1955, 62 y.o.   MRN: [id number]  HPI  Working 6d/wk - about 6-6.5 hrs/shift since August Starting yoga for back and shoulder stilff So she hasn't had any time for herself.   Migraines: almotriptan 6.25. Triggered by seasonal allergies but actually become more infrequent. She does get an aura and so if she treats with the triptan immediately, it will abort.  Seasonal allergies: flonase prn- does respond to a a seasonal allergy sinus rinse/netti pot. She was on loratadine for yrs but stopped oral antihistamines in 2017 due to report of long-term use increasing dementia risk - esp as her mother had Alzheimers.  Hypothyroid: levothyroxine 88. TSH has been within normal range for many years so checking annually.      Lab Results  Component Value Date   TSH 2.320 01/02/2017   Mood d/o: paxil 40, in remission so wants to maintain same regimen.  H/o HLD: Trying to work on tlc as no other cardiac risk factors. On 01/02/2017 10 yr ASCVD risk 2.3% with optimal risk of 2%      Lab Results  Component Value Date   LDLCALC 140 (H) 01/02/2017   LDLCALC 125 12/14/2015   LDLCALC 158 (H) 07/27/2014   Vitamin D def: 3 mos prior started on high dose once weekly supplement x 6 mos, then change to vit D 2000-5000u otc qd supp. Vit D hasn't been on for several months as her rx got messed up.       Lab Results  Component Value Date   VD25OH 18.8 (L) 01/02/2017   VD25OH 22 (L) 12/14/2015   VD25OH 29 (L) 07/27/2014    History of chronic low back pain treated by daily meloxicam. Helped by yoga. Positive family history of osteoporosis in both her sisters. Rec early dexa this yr Sister with ovarian cancer but she is doing fine now. Is getting calcium in diet - drinks milk and gets cheese sticks and yogurt for lunch.   Past Medical History:  Diagnosis Date  . Allergy   . Arthritis   . Depression   . Migraine   . Thyroid  disease    History reviewed. No pertinent surgical history. Current Outpatient Medications on File Prior to Visit  Medication Sig Dispense Refill  . almotriptan (AXERT) 6.25 MG tablet Take 1 tablet (6.25 mg total) by mouth as needed for migraine. 27 tablet 2  . Calcium Carbonate-Vitamin D (CALCIUM + D PO) Take 1 tablet by mouth daily.     . fluticasone (FLONASE) 50 MCG/ACT nasal spray Place 2 sprays into both nostrils daily. 16 g 5  . levothyroxine (SYNTHROID, LEVOTHROID) 88 MCG tablet Take 1 tablet (88 mcg total) by mouth daily. 90 tablet 2  . Multiple Vitamin (MULTIVITAMIN) tablet Take 1 tablet by mouth daily.     No current facility-administered medications on file prior to visit.    Allergies  Allergen Reactions  . Penicillins     Has patient had a PCN reaction causing immediate rash, facial/tongue/throat swelling, SOB or lightheadedness with hypotension: Yes Has patient had a PCN reaction causing severe rash involving mucus membranes or skin necrosis: No Has patient had a PCN reaction that required hospitalization No Has patient had a PCN reaction occurring within the last 10 years: No If all of the above answers are "NO", then may proceed with Cephalosporin use.   . Sulfa Antibiotics     Rash   Family History  Problem Relation Age of Onset  . Hypertension Mother   . Stroke Mother   . Mental illness Mother   . Cancer Sister        ovarian cancer  . Mental illness Sister   . Cancer Paternal Grandmother        breast  . Heart disease Paternal Grandfather   . Mental illness Brother   . Cancer Maternal Grandmother   . Mental illness Maternal Grandmother   . Mental illness Brother   . Mental illness Sister    Social History   Socioeconomic History  . Marital status: Single    Spouse name: None  . Number of children: None  . Years of education: None  . Highest education level: None  Social Needs  . Financial resource strain: None  . Food insecurity - worry: None  .  Food insecurity - inability: None  . Transportation needs - medical: None  . Transportation needs - non-medical: None  Occupational History  . Occupation: Probation officer    Comment: Futures trader for [employer]  Tobacco Use  . Smoking status: Never Smoker  . Smokeless tobacco: Never Used  Substance and Sexual Activity  . Alcohol use: Yes    Alcohol/week: 1.8 oz    Types: 3 Standard drinks or equivalent per week    Comment: 2 a week  . Drug use: No  . Sexual activity: None  Other Topics Concern  . None  Social History Narrative   Divorced. No exercise. Education, other   Depression screen [facility] 2/9 08/07/2017 04/14/2017 01/17/2017 01/11/2017 01/02/2017  Decreased Interest 0 0 0 0 0  Down, Depressed, Hopeless 0 0 0 0 0  PHQ - 2 Score 0 0 0 0 0      Review of Systems See hpi    Objective:   Physical Exam  Constitutional: She is oriented to person, place, and time. She appears well-developed and well-nourished. No distress.  HENT:  Head: Normocephalic and atraumatic.  Right Ear: External ear normal.  Left Ear: External ear normal.  Eyes: Conjunctivae are normal. No scleral icterus.  Neck: Normal range of motion. Neck supple. No thyromegaly present.  Cardiovascular: Normal rate, regular rhythm, normal heart sounds and intact distal pulses.  Pulmonary/Chest: Effort normal and breath sounds normal. No respiratory distress.  Musculoskeletal: She exhibits no edema.  Lymphadenopathy:    She has no cervical adenopathy.  Neurological: She is alert and oriented to person, place, and time.  Skin: Skin is warm and dry. She is not diaphoretic. No erythema.  Psychiatric: She has a normal mood and affect. Her behavior is normal.         BP 108/60 (BP Location: Left Arm, Patient Position: Sitting, Cuff Size: Normal)   Pulse 90   Temp 98.3 F (36.8 C) (Oral)   Resp 18   Ht 5' 3.78" (1.62 m)   Wt 162 lb 12.8 oz (73.8 kg)   SpO2 96%   BMI 28.14 kg/m   Assessment & Plan:   1. Acquired  hypothyroidism   2. Osteopenia of neck of right femur   3. Vitamin D deficiency     Orders Placed This Encounter  Procedures  . VITAMIN D 25 Hydroxy (Vit-D Deficiency, Fractures)  . TSH+T4F+T3Free    Meds ordered this encounter  Medications  . Vitamin D, Ergocalciferol, (DRISDOL) 50000 units CAPS capsule    Sig: Take 1 capsule (50,000 Units total) by mouth every 7 (seven) days.    Dispense:  12 capsule  Refill:  1  . PARoxetine (PAXIL) 40 MG tablet    Sig: Take 1 tablet (40 mg total) by mouth daily.    Dispense:  90 tablet    Refill:  3    Delman Cheadle, M.D.  Primary Care at [facility] [address] [place], [place] [address] [phone] phone [phone] fax  08/09/17 5:29 PM

## 2017-08-08 LAB — TSH+T4F+T3FREE
FREE T4: 1.13 ng/dL (ref 0.82–1.77)
T3, Free: 1.9 pg/mL — ABNORMAL LOW (ref 2.0–4.4)
TSH: 2.46 u[IU]/mL (ref 0.450–4.500)

## 2017-08-08 LAB — VITAMIN D 25 HYDROXY (VIT D DEFICIENCY, FRACTURES): VIT D 25 HYDROXY: 22.8 ng/mL — AB (ref 30.0–100.0)

## 2017-09-19 ENCOUNTER — Encounter: Payer: Self-pay | Admitting: Family Medicine

## 2017-09-19 ENCOUNTER — Ambulatory Visit (INDEPENDENT_AMBULATORY_CARE_PROVIDER_SITE_OTHER): Payer: Commercial Managed Care - PPO | Admitting: Family Medicine

## 2017-09-19 ENCOUNTER — Ambulatory Visit (INDEPENDENT_AMBULATORY_CARE_PROVIDER_SITE_OTHER): Payer: Commercial Managed Care - PPO

## 2017-09-19 ENCOUNTER — Other Ambulatory Visit: Payer: Self-pay

## 2017-09-19 VITALS — BP 102/76 | HR 78 | Temp 99.0°F | Ht 64.0 in | Wt 160.0 lb

## 2017-09-19 DIAGNOSIS — M79671 Pain in right foot: Secondary | ICD-10-CM

## 2017-09-19 DIAGNOSIS — M7989 Other specified soft tissue disorders: Secondary | ICD-10-CM | POA: Diagnosis not present

## 2017-09-19 MED ORDER — IBUPROFEN 600 MG PO TABS: 600.0000 mg | ORAL_TABLET | Freq: Three times a day (TID) | ORAL | 0 refills | Status: DC | PRN

## 2017-09-19 NOTE — Patient Instructions (Addendum)
IF you received an x-ray today, you will receive an invoice from [facility]. Please contact [facility] at [phone] with questions or concerns regarding your invoice.   IF you received labwork today, you will receive an invoice from [place]. Please contact [facility] at [phone] with questions or concerns regarding your invoice.   Our billing staff will not be able to assist you with questions regarding bills from these companies.  You will be contacted with the lab results as soon as they are available. The fastest way to get your results is to activate your My Chart account. Instructions are located on the last page of this paperwork. If you have not heard from [place] regarding the results in 2 weeks, please contact this office.    Foot Sprain A foot sprain is an injury to one of the strong bands of tissue (ligaments) that connect and support the many bones in your feet. The ligament can be stretched too much or it can tear. A tear can be either partial or complete. The severity of the sprain depends on how much of the ligament was damaged or torn. What are the causes? A foot sprain is usually caused by suddenly twisting or pivoting your foot. What increases the risk? This injury is more likely to occur in people who:  Play a sport, such as basketball or football.  Exercise or play a sport without warming up.  Start a new workout or sport.  Suddenly increase how long or hard they exercise or play a sport.  What are the signs or symptoms? Symptoms of this condition start soon after an injury and include:  Pain, especially in the arch of the foot.  Bruising.  Swelling.  Inability to walk or use the foot to support body weight.  How is this diagnosed? This condition is diagnosed with a medical history and physical exam. You may also have imaging tests, such as:  X-rays to make sure there are no broken bones (fractures).  MRI to see if the ligament  has torn.  How is this treated? Treatment varies depending on the severity of your sprain. Mild sprains can be treated with rest, ice, compression, and elevation (RICE). If your ligament is overstretched or partially torn, treatment usually involves keeping your foot in a fixed position (immobilization) for a period of time. To help you do this, your health care provider will apply a bandage, splint, or walking boot to keep your foot from moving until it heals. You may also be advised to use crutches or a scooter for a few weeks to avoid bearing weight on your foot while it is healing. If your ligament is fully torn, you may need surgery to reconnect the ligament to the bone. After surgery, a cast or splint will be applied and will need to stay on your foot while it heals. Your health care provider may also suggest exercises or physical therapy to strengthen your foot. Follow these instructions at home: If You Have a Bandage, Splint, or Walking Boot:  Wear it as directed by your health care provider. Remove it only as directed by your health care provider.  Loosen the bandage, splint, or walking boot if your toes become numb and tingle, or if they turn cold and blue. Bathing  If your health care provider approves bathing and showering, cover the bandage or splint with a watertight plastic bag to protect it from water. Do not let the bandage or splint get wet. Managing pain,  stiffness, and swelling  If directed, apply ice to the injured area: ? Put ice in a plastic bag. ? Place a towel between your skin and the bag. ? Leave the ice on for 20 minutes, 2-3 times per day.  Move your toes often to avoid stiffness and to lessen swelling.  Raise (elevate) the injured area above the level of your heart while you are sitting or lying down. Driving  Do not drive or operate heavy machinery while taking pain medicine.  Ask your health care provider when it is safe to drive if you have a bandage,  splint, or walking boot on your foot. Activity  Rest as directed by your health care provider.  Do not use the injured foot to support your body weight until your health care provider says that you can. Use crutches or other supportive devices as directed by your health care provider.  Ask your health care provider what activities are safe for you. Gradually increase how much and how far you walk until your health care provider says it is safe to return to full activity.  Do any exercise or physical therapy as directed by your health care provider. General instructions  If a splint was applied, do not put pressure on any part of it until it is fully hardened. This may take several hours.  Take medicines only as directed by your health care provider. These include over-the-counter medicines and prescription medicines.  Keep all follow-up visits as directed by your health care provider. This is important.  When you can walk without pain, wear supportive shoes that have stiff soles. Do not wear flip-flops, and do not walk barefoot. Contact a health care provider if:  Your pain is not controlled with medicine.  Your bruising or swelling gets worse or does not get better with treatment.  Your splint or walking boot is damaged. Get help right away if:  You develop severe numbness or tingling in your foot.  Your foot turns blue, white, or gray, and it feels cold. This information is not intended to replace advice given to you by your health care provider. Make sure you discuss any questions you have with your health care provider. Document Released: 12/21/2001 Document Revised: 12/07/2015 Document Reviewed: 05/04/2014 Elsevier Interactive Patient Education  2018 [employer].

## 2017-09-19 NOTE — Progress Notes (Signed)
3/8/20194:41 PM  Sheri Adkins Nov 11, 1955, 62 y.o. female [id number]  Chief Complaint  Patient presents with  . Pain    right foot pain due to waling on unstable sidewalk. Foot went into a sink hole. Caused a fall onto the left knee, however no pain or broken skin     HPI:   Patient is a 62 y.o. female  who presents today with 2 days of right foot pain and swelling. Started yesterday after her foot went through a sink hole in the street. She has not been able to fully bear weight on it. She has been elevating. She states that pain is along lateral aspect of foot. She denies any pain of ankle. She denies any numbness or tingling.  Depression screen [facility] 2/9 09/19/2017 08/07/2017 04/14/2017  Decreased Interest 0 0 0  Down, Depressed, Hopeless 0 0 0  PHQ - 2 Score 0 0 0    Allergies  Allergen Reactions  . Penicillins     Has patient had a PCN reaction causing immediate rash, facial/tongue/throat swelling, SOB or lightheadedness with hypotension: Yes Has patient had a PCN reaction causing severe rash involving mucus membranes or skin necrosis: No Has patient had a PCN reaction that required hospitalization No Has patient had a PCN reaction occurring within the last 10 years: No If all of the above answers are "NO", then may proceed with Cephalosporin use.   . Sulfa Antibiotics     Rash    Prior to Admission medications   Medication Sig Start Date End Date Taking? Authorizing Provider  almotriptan (AXERT) 6.25 MG tablet Take 1 tablet (6.25 mg total) by mouth as needed for migraine. 04/14/17  Yes Shawnee Knapp, MD  Calcium Carbonate-Vitamin D (CALCIUM + D PO) Take 1 tablet by mouth daily.    Yes [provider]  fluticasone (FLONASE) 50 MCG/ACT nasal spray Place 2 sprays into both nostrils daily. 04/13/17  Yes Shawnee Knapp, MD  levothyroxine (SYNTHROID, LEVOTHROID) 88 MCG tablet Take 1 tablet (88 mcg total) by mouth daily. 04/14/17  Yes Shawnee Knapp, MD  Multiple Vitamin  (MULTIVITAMIN) tablet Take 1 tablet by mouth daily.   Yes [provider]  PARoxetine (PAXIL) 40 MG tablet Take 1 tablet (40 mg total) by mouth daily. 08/07/17  Yes Shawnee Knapp, MD  Vitamin D, Ergocalciferol, (DRISDOL) 50000 units CAPS capsule Take 1 capsule (50,000 Units total) by mouth every 7 (seven) days. 08/07/17  Yes Shawnee Knapp, MD    Past Medical History:  Diagnosis Date  . Allergy   . Arthritis   . Depression   . Migraine   . Thyroid disease     History reviewed. No pertinent surgical history.  Social History   Tobacco Use  . Smoking status: Never Smoker  . Smokeless tobacco: Never Used  Substance Use Topics  . Alcohol use: Yes    Alcohol/week: 1.8 oz    Types: 3 Standard drinks or equivalent per week    Comment: 2 a week    Family History  Problem Relation Age of Onset  . Hypertension Mother   . Stroke Mother   . Mental illness Mother   . Cancer Sister        ovarian cancer  . Mental illness Sister   . Cancer Paternal Grandmother        breast  . Heart disease Paternal Grandfather   . Mental illness Brother   . Cancer Maternal Grandmother   . Mental illness  Maternal Grandmother   . Mental illness Brother   . Mental illness Sister     ROS Per hpi  OBJECTIVE:  Blood pressure 102/76, pulse 78, temperature 99 F (37.2 C), temperature source Oral, height 5\' 4"  (1.626 m), weight 160 lb (72.6 kg), SpO2 99 %.  Physical Exam  Gen: AAOx3, NAD Right foot: ankle FROM, no tenderness at malleolus. Lateral aspect just distal of ankle with swelling and mild bruising. TTP at base of 5th and 4th metatarsals. Neurovascularly intact.    Dg Foot Complete Right  Result Date: 09/19/2017 CLINICAL DATA:  feel through a sink hole yesterday, pain and swelling along base of 5th and 4th metatarsals EXAM: RIGHT FOOT COMPLETE - 3+ VIEW COMPARISON:  None. FINDINGS: No fracture.  No bone lesion. Minor first metatarsophalangeal joint asymmetric narrowing. Remaining joints  normally spaced and aligned. Soft tissues are unremarkable. IMPRESSION: 1. No fracture, dislocation or acute finding. Electronically Signed   By: Lajean Manes M.D.   On: 09/19/2017 17:22    ASSESSMENT and PLAN  1. Acute foot pain, right No fractures on xray. Discussed RICE therapy. Patient educational handout given. - DG Foot Complete Right; Future -ibuprofen 600mg  PO TID  Return if symptoms worsen or fail to improve.    Rutherford Guys, MD Primary Care at [place], [place] [address] Ph.  [phone] Fax [phone]

## 2018-01-02 ENCOUNTER — Other Ambulatory Visit: Payer: Self-pay

## 2018-01-02 ENCOUNTER — Ambulatory Visit (INDEPENDENT_AMBULATORY_CARE_PROVIDER_SITE_OTHER): Payer: Commercial Managed Care - PPO | Admitting: Urgent Care

## 2018-01-02 ENCOUNTER — Encounter: Payer: Self-pay | Admitting: Urgent Care

## 2018-01-02 ENCOUNTER — Ambulatory Visit: Payer: Commercial Managed Care - PPO | Admitting: Emergency Medicine

## 2018-01-02 VITALS — BP 100/64 | HR 84 | Temp 98.6°F | Resp 16 | Ht 63.5 in | Wt 164.8 lb

## 2018-01-02 DIAGNOSIS — H5789 Other specified disorders of eye and adnexa: Secondary | ICD-10-CM

## 2018-01-02 DIAGNOSIS — Z9109 Other allergy status, other than to drugs and biological substances: Secondary | ICD-10-CM | POA: Diagnosis not present

## 2018-01-02 MED ORDER — AZELASTINE HCL 0.05 % OP SOLN: 1.0000 [drp] | Freq: Two times a day (BID) | OPHTHALMIC | 12 refills | Status: DC

## 2018-01-02 NOTE — Progress Notes (Signed)
    MRN: [id number] DOB: 05/12/1956  Subjective:   Sheri Adkins is a 62 y.o. female presenting for 1 month history of persistent bilateral eye redness, itching.  Feels like her eyes water as well.  She has not changed anything in her routine except that she did get a new cat toward the end of spring in March and April.  She denies any history of cat allergies but does have environmental allergies.  She uses Flonase and over-the-counter loratadine as needed for this.  Denies photophobia, eye pain, eye trauma, matted eyes, eye swelling.  She has not tried any eyedrops of any kind.  She wears glasses, has not had an eye exam in about a year and a half.  Ilyse has a current medication list which includes the following prescription(s): almotriptan, fluticasone, ibuprofen, levothyroxine, multivitamin, paroxetine, calcium citrate-vitamin d, and vitamin d (ergocalciferol). Also is allergic to penicillins and sulfa antibiotics.  Sheri Adkins  has a past medical history of Allergy, Arthritis, Depression, Migraine, and Thyroid disease. Also  has no past surgical history on file.  Objective:   Vitals: BP 100/64 (BP Location: Left Arm)   Pulse 84   Temp 98.6 F (37 C) (Oral)   Resp 16   Ht 5' 3.5" (1.613 m)   Wt 164 lb 12.8 oz (74.8 kg)   SpO2 94%   BMI 28.74 kg/m    Visual Acuity Screening   Right eye Left eye Both eyes  Without correction:     With correction: 20/20 20/20 20/20    Physical Exam  Constitutional: She is oriented to person, place, and time. She appears well-developed and well-nourished.  Eyes: Pupils are equal, round, and reactive to light. EOM are normal. Right eye exhibits no discharge. Left eye exhibits no discharge. No scleral icterus.  Conjunctivae is injected bilaterally but there is no ecchymosis, hemorrhage, drainage.  Ocular eye movements are intact and without tenderness.  There is no photosensitivity.  Cardiovascular: Normal rate.  Pulmonary/Chest: Effort normal.    Neurological: She is alert and oriented to person, place, and time.   Assessment and Plan :   Itchy, watery, and red eye  Redness of both eyes  Environmental allergies  Physical exam findings are very reassuring, visual acuity is at 20/20.  For now we will manage this as an allergic conjunctivitis likely due to her new cat.  We will try azelastine ophthalmic but if this is to expensive patient will try Opcon-A.  She will try to set up a follow-up appointment with her ophthalmologist.  Return to clinic precautions discussed.  Jaynee Eagles, PA-C Primary Care at [place] [phone] 01/02/2018  11:32 AM

## 2018-01-02 NOTE — Patient Instructions (Addendum)
If azelastine is too pricey, then use over-the-counter Opcon A.         Allergic Conjunctivitis, Adult Allergic conjunctivitis is inflammation of the clear membrane that covers the white part of your eye and the inner surface of your eyelid (conjunctiva). The inflammation is caused by allergies. The blood vessels in the conjunctiva become inflamed and this causes the eyes to become red or pink. The eyes often feel itchy. Allergic conjunctivitis cannot be spread from one person to another person (is not contagious). What are the causes? This condition is caused by an allergic reaction. Common causes of an allergic reaction (allergens) include:  Outdoor allergens, such as: ? Pollen. ? Grass and weeds. ? Mold spores.  Indoor allergens, such as: ? Dust. ? Smoke. ? Mold. ? Pet dander. ? Animal hair.  What increases the risk? You may be more likely to develop this condition if you have a family history of allergies, such as:  Allergic rhinitis.  Bronchial asthma.  Atopic dermatitis.  What are the signs or symptoms? Symptoms of this condition include eyes that are:  Itchy.  Red.  Watery.  Puffy.  Your eyes may also:  Sting or burn.  Have clear drainage coming from them.  How is this diagnosed? This condition may be diagnosed by medical history and physical exam. If you have drainage from your eyes, it may be tested to rule out other causes of conjunctivitis. You may also need to see a health care provider who specializes in treating allergies (allergist) or eye conditions (ophthalmologist) for tests to confirm the diagnosis. You may have:  Skin tests to see which allergens are causing your symptoms. These tests involve pricking the skin with a tiny needle and exposing the skin to small amounts of potential allergens to see if your skin reacts.  Blood tests.  Tissue scrapings from your eyelid. These will be examined under a microscope.  How is this  treated? Treatments for this condition may include:  Cold cloths (compresses) to soothe itching and swelling.  Washing the face to remove allergens.  Eye drops. These may be prescription or over-the-counter. There are several different types. You may need to try different types to see which one works best for you. Your may need: ? Eye drops that block the allergic reaction (antihistamine). ? Eye drops that reduce swelling and irritation (anti-inflammatory). ? Steroid eye drops to lessen a severe reaction (vernal conjunctivitis).  Oral antihistamine medicines to reduce your allergic reaction. You may need these if eye drops do not help or are difficult to use.  Follow these instructions at home:  Avoid known allergens whenever possible.  Take or apply over-the-counter and prescription medicines only as told by your health care provider. These include any eye drops.  Apply a cool, clean washcloth to your eye for 10-20 minutes, 3-4 times a day.  Do not touch or rub your eyes.  Do not wear contact lenses until the inflammation is gone. Wear glasses instead.  Do not wear eye makeup until the inflammation is gone.  Keep all follow-up visits as told by your health care provider. This is important. Contact a health care provider if:  Your symptoms get worse or do not improve with treatment.  You have mild eye pain.  You have sensitivity to light.  You have spots or blisters on your eyes.  You have pus draining from your eye.  You have a fever. Get help right away if:  You have redness, swelling, or other  symptoms in only one eye.  Your vision is blurred or you have vision changes.  You have severe eye pain. This information is not intended to replace advice given to you by your health care provider. Make sure you discuss any questions you have with your health care provider. Document Released: 09/21/2002 Document Revised: 02/28/2016 Document Reviewed: 01/12/2016 Elsevier  Interactive Patient Education  2018 [employer].     IF you received an x-ray today, you will receive an invoice from [facility]. Please contact [facility] at [phone] with questions or concerns regarding your invoice.   IF you received labwork today, you will receive an invoice from [place]. Please contact [facility] at [phone] with questions or concerns regarding your invoice.   Our billing staff will not be able to assist you with questions regarding bills from these companies.  You will be contacted with the lab results as soon as they are available. The fastest way to get your results is to activate your My Chart account. Instructions are located on the last page of this paperwork. If you have not heard from [place] regarding the results in 2 weeks, please contact this office.

## 2018-02-24 ENCOUNTER — Other Ambulatory Visit: Payer: Self-pay | Admitting: Family Medicine

## 2018-02-24 NOTE — Telephone Encounter (Signed)
Levothyroxine  refill Last Refill:11/26/17 # 90  Last OV: 01/02/18 PCP: [facility] [place] [facility]

## 2018-03-25 ENCOUNTER — Other Ambulatory Visit: Payer: Self-pay

## 2018-03-25 ENCOUNTER — Encounter: Payer: Self-pay | Admitting: Physician Assistant

## 2018-03-25 ENCOUNTER — Ambulatory Visit (INDEPENDENT_AMBULATORY_CARE_PROVIDER_SITE_OTHER): Payer: Commercial Managed Care - PPO | Admitting: Physician Assistant

## 2018-03-25 VITALS — BP 112/62 | HR 83 | Temp 99.0°F | Resp 16 | Ht 63.0 in | Wt 161.2 lb

## 2018-03-25 DIAGNOSIS — N898 Other specified noninflammatory disorders of vagina: Secondary | ICD-10-CM

## 2018-03-25 DIAGNOSIS — B9689 Other specified bacterial agents as the cause of diseases classified elsewhere: Secondary | ICD-10-CM | POA: Diagnosis not present

## 2018-03-25 DIAGNOSIS — N760 Acute vaginitis: Secondary | ICD-10-CM | POA: Diagnosis not present

## 2018-03-25 LAB — POCT WET + KOH PREP
Trich by wet prep: ABSENT
Yeast by KOH: ABSENT
Yeast by wet prep: ABSENT

## 2018-03-25 MED ORDER — METRONIDAZOLE 500 MG PO TABS: 500.0000 mg | ORAL_TABLET | Freq: Two times a day (BID) | ORAL | 0 refills | Status: DC

## 2018-03-25 NOTE — Progress Notes (Signed)
Sheri Adkins  MRN: [id number] DOB: 1955/09/03  PCP: Sheri Knapp, MD  Subjective:  Pt is a 62 year old female who presents to clinic for vaginal irritation x 4 weeks. Irritation is mostly internal.   She has tried otc vaginal suppositories x 2.  Denies discharge, burning, pain, fever, chills, urinary symptoms, vaginal bleeding. She has not been sexually active in several years.    Review of Systems  Constitutional: Negative for chills, fatigue and fever.  Gastrointestinal: Negative for abdominal pain, diarrhea, nausea and vomiting.  Genitourinary: Negative for decreased urine volume, difficulty urinating, dysuria, enuresis, flank pain, frequency, hematuria, pelvic pain, urgency, vaginal bleeding, vaginal discharge and vaginal pain.  Musculoskeletal: Negative for back pain.  Neurological: Negative for dizziness, weakness, light-headedness and headaches.    Patient Active Problem List   Diagnosis Date Noted  . Osteopenia of neck of right femur 08/07/2017  . Cervical spondylosis 03/11/2014  . Migraine variants, not intractable 03/11/2014  . Hypothyroidism 11/02/2013  . Depression 11/02/2013  . Elevated LDL cholesterol level 11/02/2013    Current Outpatient Medications on File Prior to Visit  Medication Sig Dispense Refill  . almotriptan (AXERT) 6.25 MG tablet Take 1 tablet (6.25 mg total) by mouth as needed for migraine. 27 tablet 2  . azelastine (OPTIVAR) 0.05 % ophthalmic solution Place 1 drop into both eyes 2 (two) times daily. 6 mL 12  . fluticasone (FLONASE) 50 MCG/ACT nasal spray Place 2 sprays into both nostrils daily. 16 g 5  . ibuprofen (ADVIL,MOTRIN) 600 MG tablet Take 1 tablet (600 mg total) by mouth every 8 (eight) hours as needed. 30 tablet 0  . levothyroxine (SYNTHROID, LEVOTHROID) 88 MCG tablet TAKE 1 TABLET DAILY 90 tablet 2  . PARoxetine (PAXIL) 40 MG tablet Take 1 tablet (40 mg total) by mouth daily. 90 tablet 3   No current facility-administered medications on  file prior to visit.     Allergies  Allergen Reactions  . Penicillins     Has patient had a PCN reaction causing immediate rash, facial/tongue/throat swelling, SOB or lightheadedness with hypotension: Yes Has patient had a PCN reaction causing severe rash involving mucus membranes or skin necrosis: No Has patient had a PCN reaction that required hospitalization No Has patient had a PCN reaction occurring within the last 10 years: No If all of the above answers are "NO", then may proceed with Cephalosporin use.   . Sulfa Antibiotics     Rash     Objective:  BP 112/62 (BP Location: Left Arm, Patient Position: Sitting, Cuff Size: Large)   Pulse 83   Temp 99 F (37.2 C) (Oral)   Resp 16   Ht 5\' 3"  (1.6 m)   Wt 161 lb 3.2 oz (73.1 kg)   SpO2 98%   BMI 28.56 kg/m   Physical Exam  Constitutional: She is oriented to person, place, and time. No distress.  Genitourinary: Cervix exhibits no motion tenderness. Vaginal discharge (thin, white) found.  Neurological: She is alert and oriented to person, place, and time.  Skin: Skin is warm and dry.  Psychiatric: Judgment normal.  Vitals reviewed.  Results for orders placed or performed in visit on 03/25/18  POCT Wet + KOH Prep  Result Value Ref Range   Yeast by KOH Absent Absent   Yeast by wet prep Absent Absent   WBC by wet prep Few Few   Clue Cells Wet Prep HPF POC Few (A) None   Trich by wet prep Absent Absent  Bacteria Wet Prep HPF POC Many (A) Few   Epithelial Cells By [employer] Pref ([facility]) Few None, Few, Too numerous to count   RBC,UR,HPF,POC None None RBC/hpf    Assessment and Plan :  1. BV (bacterial vaginosis) - Pt presents for vaginal irritation x 1 month, refractory to otc suppositories. Wet prep +clue cells. Plan to treat for BV. RTC if symptoms fail to improve.  - metroNIDAZOLE (FLAGYL) 500 MG tablet; Take 1 tablet (500 mg total) by mouth 2 (two) times daily with a meal. DO NOT CONSUME ALCOHOL WHILE TAKING THIS MEDICATION.   Dispense: 14 tablet; Refill: 0  2. Vaginal irritation - POCT Wet + KOH Prep   Mercer Pod, PA-C  [facility] 03/25/2018 9:27 AM  Please note: Portions of this report may have been transcribed using dragon voice recognition software. Every effort was made to ensure accuracy; however, inadvertent computerized transcription errors may be present.

## 2018-03-25 NOTE — Patient Instructions (Addendum)
You do not have a yeast infection.  I am treating you today for bacterial vaginosis.  Flagyl - 2 pills twice daily x 1 week. Do not drink any alcohol while taking this medication.    Bacterial Vaginosis Bacterial vaginosis is a vaginal infection that occurs when the normal balance of bacteria in the vagina is disrupted. It results from an overgrowth of certain bacteria. This is the most common vaginal infection among women ages 40-44. Because bacterial vaginosis increases your risk for STIs (sexually transmitted infections), getting treated can help reduce your risk for chlamydia, gonorrhea, herpes, and HIV (human immunodeficiency virus). Treatment is also important for preventing complications in pregnant women, because this condition can cause an early (premature) delivery. What are the causes? This condition is caused by an increase in harmful bacteria that are normally present in small amounts in the vagina. However, the reason that the condition develops is not fully understood. What increases the risk? The following factors may make you more likely to develop this condition:  Having a new sexual partner or multiple sexual partners.  Having unprotected sex.  Douching.  Having an intrauterine device (IUD).  Smoking.  Drug and alcohol abuse.  Taking certain antibiotic medicines.  Being pregnant.  You cannot get bacterial vaginosis from toilet seats, bedding, swimming pools, or contact with objects around you. What are the signs or symptoms? Symptoms of this condition include:  Grey or white vaginal discharge. The discharge can also be watery or foamy.  A fish-like odor with discharge, especially after sexual intercourse or during menstruation.  Itching in and around the vagina.  Burning or pain with urination.  Some women with bacterial vaginosis have no signs or symptoms. How is this diagnosed? This condition is diagnosed based on:  Your medical history.  A  physical exam of the vagina.  Testing a sample of vaginal fluid under a microscope to look for a large amount of bad bacteria or abnormal cells. Your health care provider may use a cotton swab or a small wooden spatula to collect the sample.  How is this treated? This condition is treated with antibiotics. These may be given as a pill, a vaginal cream, or a medicine that is put into the vagina (suppository). If the condition comes back after treatment, a second round of antibiotics may be needed. Follow these instructions at home: Medicines  Take over-the-counter and prescription medicines only as told by your health care provider.  Take or use your antibiotic as told by your health care provider. Do not stop taking or using the antibiotic even if you start to feel better. General instructions  If you have a female sexual partner, tell her that you have a vaginal infection. She should see her health care provider and be treated if she has symptoms. If you have a female sexual partner, he does not need treatment.  During treatment: ? Avoid sexual activity until you finish treatment. ? Do not douche. ? Avoid alcohol as directed by your health care provider. ? Avoid breastfeeding as directed by your health care provider.  Drink enough water and fluids to keep your urine clear or pale yellow.  Keep the area around your vagina and rectum clean. ? Wash the area daily with warm water. ? Wipe yourself from front to back after using the toilet.  Keep all follow-up visits as told by your health care provider. This is important. How is this prevented?  Do not douche.  Wash the outside of your vagina  with warm water only.  Use protection when having sex. This includes latex condoms and dental dams.  Limit how many sexual partners you have. To help prevent bacterial vaginosis, it is best to have sex with just one partner (monogamous).  Make sure you and your sexual partner are tested for  STIs.  Wear cotton or cotton-lined underwear.  Avoid wearing tight pants and pantyhose, especially during summer.  Limit the amount of alcohol that you drink.  Do not use any products that contain nicotine or tobacco, such as cigarettes and e-cigarettes. If you need help quitting, ask your health care provider.  Do not use illegal drugs. Where to find more information:  Centers for Disease Control and Prevention: AppraiserFraud.fi  American Sexual Health Association (ASHA): www.ashastd.org  U.S. Department of Health and Financial controller, Office on Women's Health: DustingSprays.pl or SecuritiesCard.it Contact a health care provider if:  Your symptoms do not improve, even after treatment.  You have more discharge or pain when urinating.  You have a fever.  You have pain in your abdomen.  You have pain during sex.  You have vaginal bleeding between periods. Summary  Bacterial vaginosis is a vaginal infection that occurs when the normal balance of bacteria in the vagina is disrupted.  Because bacterial vaginosis increases your risk for STIs (sexually transmitted infections), getting treated can help reduce your risk for chlamydia, gonorrhea, herpes, and HIV (human immunodeficiency virus). Treatment is also important for preventing complications in pregnant women, because the condition can cause an early (premature) delivery.  This condition is treated with antibiotic medicines. These may be given as a pill, a vaginal cream, or a medicine that is put into the vagina (suppository). This information is not intended to replace advice given to you by your health care provider. Make sure you discuss any questions you have with your health care provider. Document Released: 07/01/2005 Document Revised: 11/04/2016 Document Reviewed: 03/16/2016 Elsevier Interactive Patient Education  2018 [employer].  IF you received an x-ray today, you will  receive an invoice from [facility]. Please contact [facility] at [phone] with questions or concerns regarding your invoice.   IF you received labwork today, you will receive an invoice from [facility]. Please contact [facility] at [phone] with questions or concerns regarding your invoice.   Our billing staff will not be able to assist you with questions regarding bills from these companies.  You will be contacted with the lab results as soon as they are available. The fastest way to get your results is to activate your My Chart account. Instructions are located on the last page of this paperwork. If you have not heard from [place] regarding the results in 2 weeks, please contact this office.

## 2018-04-03 ENCOUNTER — Telehealth: Payer: Self-pay | Admitting: Physician Assistant

## 2018-04-03 NOTE — Telephone Encounter (Signed)
Copied from [place] [id number]. Topic: General - Other >> Apr 03, 2018 10:03 AM Parke Poisson wrote:    Patient requesting metroNIDAZOLE (FLAGYL) 500 MG tablet refill, patient experiencing burning and sense of urgency please advise [id number] [place], [place] - [address] [phone] (Phone) [phone] (Fax)

## 2018-04-09 NOTE — Telephone Encounter (Signed)
Pt needs to be seen

## 2018-04-10 ENCOUNTER — Telehealth: Payer: Self-pay | Admitting: Physician Assistant

## 2018-04-10 NOTE — Telephone Encounter (Signed)
Called pt in reference to her request for a RX. Per [place], pt will need to be seen for an OV. I left VM for pt to call the office and schedule an appt at her convenience.  When pt calls back, please make an OV at her convenience.  Thank you!

## 2018-07-10 DIAGNOSIS — H25813 Combined forms of age-related cataract, bilateral: Secondary | ICD-10-CM | POA: Diagnosis not present

## 2018-07-10 DIAGNOSIS — H43813 Vitreous degeneration, bilateral: Secondary | ICD-10-CM | POA: Diagnosis not present

## 2018-07-10 DIAGNOSIS — H4089 Other specified glaucoma: Secondary | ICD-10-CM | POA: Diagnosis not present

## 2018-07-15 DIAGNOSIS — Z9289 Personal history of other medical treatment: Secondary | ICD-10-CM

## 2018-07-15 HISTORY — DX: Personal history of other medical treatment: Z92.89

## 2018-07-30 ENCOUNTER — Telehealth: Payer: Self-pay | Admitting: Family Medicine

## 2018-07-30 NOTE — Telephone Encounter (Signed)
Copied from [place] [id number]. Topic: Quick Communication - Rx Refill/Question >> Jul 30, 2018 12:45 PM Rayann Heman wrote: Medication:PARoxetine (PAXIL) 40 MG tablet [[id number]] fluticasone (FLONASE) 50 MCG/ACT nasal spray [[id number]]  pt would like to know if she can get enough medication until appointment in 06/26/19.   Has the patient contacted their pharmacy? No Preferred Pharmacy (with phone number or street name): [id number] - [place], [place] - [address] [phone] (Phone) [phone] (Fax)    Agent: Please be advised that RX refills may take up to 3 business days. We ask that you follow-up with your pharmacy.

## 2018-08-02 ENCOUNTER — Other Ambulatory Visit: Payer: Self-pay | Admitting: Family Medicine

## 2018-08-26 ENCOUNTER — Ambulatory Visit: Payer: Commercial Managed Care - PPO | Admitting: Emergency Medicine

## 2018-08-26 ENCOUNTER — Other Ambulatory Visit: Payer: Self-pay

## 2018-08-26 ENCOUNTER — Encounter

## 2018-08-26 ENCOUNTER — Encounter: Payer: Self-pay | Admitting: Emergency Medicine

## 2018-08-26 VITALS — BP 117/73 | HR 73 | Temp 98.9°F | Resp 16 | Ht 63.5 in | Wt 155.2 lb

## 2018-08-26 DIAGNOSIS — Z9109 Other allergy status, other than to drugs and biological substances: Secondary | ICD-10-CM

## 2018-08-26 DIAGNOSIS — Z1211 Encounter for screening for malignant neoplasm of colon: Secondary | ICD-10-CM

## 2018-08-26 DIAGNOSIS — F3342 Major depressive disorder, recurrent, in full remission: Secondary | ICD-10-CM

## 2018-08-26 DIAGNOSIS — E039 Hypothyroidism, unspecified: Secondary | ICD-10-CM

## 2018-08-26 DIAGNOSIS — G43809 Other migraine, not intractable, without status migrainosus: Secondary | ICD-10-CM | POA: Diagnosis not present

## 2018-08-26 MED ORDER — LEVOTHYROXINE SODIUM 88 MCG PO TABS: 88.0000 ug | ORAL_TABLET | Freq: Every day | ORAL | 3 refills | Status: DC

## 2018-08-26 MED ORDER — FLUTICASONE PROPIONATE 50 MCG/ACT NA SUSP: 2.0000 | Freq: Every day | NASAL | 5 refills | Status: DC

## 2018-08-26 MED ORDER — ALMOTRIPTAN MALATE 6.25 MG PO TABS: 6.2500 mg | ORAL_TABLET | ORAL | 2 refills | Status: DC | PRN

## 2018-08-26 MED ORDER — PAROXETINE HCL 40 MG PO TABS: 40.0000 mg | ORAL_TABLET | Freq: Every day | ORAL | 3 refills | Status: DC

## 2018-08-26 NOTE — Patient Instructions (Addendum)
If you have lab work done today you will be contacted with your lab results within the next 2 weeks.  If you have not heard from [place] then please contact us. The fastest way to get your results is to register for My Chart.   IF you received an x-ray today, you will receive an invoice from [facility]. Please contact [facility] at [phone] with questions or concerns regarding your invoice.   IF you received labwork today, you will receive an invoice from [place]. Please contact [facility] at [phone] with questions or concerns regarding your invoice.   Our billing staff will not be able to assist you with questions regarding bills from these companies.  You will be contacted with the lab results as soon as they are available. The fastest way to get your results is to activate your My Chart account. Instructions are located on the last page of this paperwork. If you have not heard from [place] regarding the results in 2 weeks, please contact this office.    Health Maintenance, Female Adopting a healthy lifestyle and getting preventive care can go a long way to promote health and wellness. Talk with your health care provider about what schedule of regular examinations is right for you. This is a good chance for you to check in with your provider about disease prevention and staying healthy. In between checkups, there are plenty of things you can do on your own. Experts have done a lot of research about which lifestyle changes and preventive measures are most likely to keep you healthy. Ask your health care provider for more information. Weight and diet Eat a healthy diet  Be sure to include plenty of vegetables, fruits, low-fat dairy products, and lean protein.  Do not eat a lot of foods high in solid fats, added sugars, or salt.  Get regular exercise. This is one of the most important things you can do for your health. ? Most adults should exercise for at least 150  minutes each week. The exercise should increase your heart rate and make you sweat (moderate-intensity exercise). ? Most adults should also do strengthening exercises at least twice a week. This is in addition to the moderate-intensity exercise. Maintain a healthy weight  Body mass index (BMI) is a measurement that can be used to identify possible weight problems. It estimates body fat based on height and weight. Your health care provider can help determine your BMI and help you achieve or maintain a healthy weight.  For females 12 years of age and older: ? A BMI below 18.5 is considered underweight. ? A BMI of 18.5 to 24.9 is normal. ? A BMI of 25 to 29.9 is considered overweight. ? A BMI of 30 and above is considered obese. Watch levels of cholesterol and blood lipids  You should start having your blood tested for lipids and cholesterol at 62 years of age, then have this test every 5 years.  You may need to have your cholesterol levels checked more often if: ? Your lipid or cholesterol levels are high. ? You are older than 62 years of age. ? You are at high risk for heart disease. Cancer screening Lung Cancer  Lung cancer screening is recommended for adults 58-36 years old who are at high risk for lung cancer because of a history of smoking.  A yearly low-dose CT scan of the lungs is recommended for people who: ? Currently smoke. ? Have quit within the past 15  years. ? Have at least a 30-pack-year history of smoking. A pack year is smoking an average of one pack of cigarettes a day for 1 year.  Yearly screening should continue until it has been 15 years since you quit.  Yearly screening should stop if you develop a health problem that would prevent you from having lung cancer treatment. Breast Cancer  Practice breast self-awareness. This means understanding how your breasts normally appear and feel.  It also means doing regular breast self-exams. Let your health care provider  know about any changes, no matter how small.  If you are in your 20s or 30s, you should have a clinical breast exam (CBE) by a health care provider every 1-3 years as part of a regular health exam.  If you are 3 or older, have a CBE every year. Also consider having a breast X-ray (mammogram) every year.  If you have a family history of breast cancer, talk to your health care provider about genetic screening.  If you are at high risk for breast cancer, talk to your health care provider about having an MRI and a mammogram every year.  Breast cancer gene (BRCA) assessment is recommended for women who have family members with BRCA-related cancers. BRCA-related cancers include: ? Breast. ? Ovarian. ? Tubal. ? Peritoneal cancers.  Results of the assessment will determine the need for genetic counseling and BRCA1 and BRCA2 testing. Cervical Cancer Your health care provider may recommend that you be screened regularly for cancer of the pelvic organs (ovaries, uterus, and vagina). This screening involves a pelvic examination, including checking for microscopic changes to the surface of your cervix (Pap test). You may be encouraged to have this screening done every 3 years, beginning at age 63.  For women ages 71-65, health care providers may recommend pelvic exams and Pap testing every 3 years, or they may recommend the Pap and pelvic exam, combined with testing for human papilloma virus (HPV), every 5 years. Some types of HPV increase your risk of cervical cancer. Testing for HPV may also be done on women of any age with unclear Pap test results.  Other health care providers may not recommend any screening for nonpregnant women who are considered low risk for pelvic cancer and who do not have symptoms. Ask your health care provider if a screening pelvic exam is right for you.  If you have had past treatment for cervical cancer or a condition that could lead to cancer, you need Pap tests and  screening for cancer for at least 20 years after your treatment. If Pap tests have been discontinued, your risk factors (such as having a new sexual partner) need to be reassessed to determine if screening should resume. Some women have medical problems that increase the chance of getting cervical cancer. In these cases, your health care provider may recommend more frequent screening and Pap tests. Colorectal Cancer  This type of cancer can be detected and often prevented.  Routine colorectal cancer screening usually begins at 62 years of age and continues through 62 years of age.  Your health care provider may recommend screening at an earlier age if you have risk factors for colon cancer.  Your health care provider may also recommend using home test kits to check for hidden blood in the stool.  A small camera at the end of a tube can be used to examine your colon directly (sigmoidoscopy or colonoscopy). This is done to check for the earliest forms of colorectal cancer.  Routine screening usually begins at age 50.  Direct examination of the colon should be repeated every 5-10 years through 62 years of age. However, you may need to be screened more often if early forms of precancerous polyps or small growths are found. Skin Cancer  Check your skin from head to toe regularly.  Tell your health care provider about any new moles or changes in moles, especially if there is a change in a mole's shape or color.  Also tell your health care provider if you have a mole that is larger than the size of a pencil eraser.  Always use sunscreen. Apply sunscreen liberally and repeatedly throughout the day.  Protect yourself by wearing long sleeves, pants, a wide-brimmed hat, and sunglasses whenever you are outside. Heart disease, diabetes, and high blood pressure  High blood pressure causes heart disease and increases the risk of stroke. High blood pressure is more likely to develop in: ? People who  have blood pressure in the high end of the normal range (130-139/85-89 mm Hg). ? People who are overweight or obese. ? People who are African American.  If you are 18-39 years of age, have your blood pressure checked every 3-5 years. If you are 40 years of age or older, have your blood pressure checked every year. You should have your blood pressure measured twice-once when you are at a hospital or clinic, and once when you are not at a hospital or clinic. Record the average of the two measurements. To check your blood pressure when you are not at a hospital or clinic, you can use: ? An automated blood pressure machine at a pharmacy. ? A home blood pressure monitor.  If you are between 55 years and 79 years old, ask your health care provider if you should take aspirin to prevent strokes.  Have regular diabetes screenings. This involves taking a blood sample to check your fasting blood sugar level. ? If you are at a normal weight and have a low risk for diabetes, have this test once every three years after 62 years of age. ? If you are overweight and have a high risk for diabetes, consider being tested at a younger age or more often. Preventing infection Hepatitis B  If you have a higher risk for hepatitis B, you should be screened for this virus. You are considered at high risk for hepatitis B if: ? You were born in a country where hepatitis B is common. Ask your health care provider which countries are considered high risk. ? Your parents were born in a high-risk country, and you have not been immunized against hepatitis B (hepatitis B vaccine). ? You have HIV or AIDS. ? You use needles to inject street drugs. ? You live with someone who has hepatitis B. ? You have had sex with someone who has hepatitis B. ? You get hemodialysis treatment. ? You take certain medicines for conditions, including cancer, organ transplantation, and autoimmune conditions. Hepatitis C  Blood testing is  recommended for: ? Everyone born from 1945 through 1965. ? Anyone with known risk factors for hepatitis C. Sexually transmitted infections (STIs)  You should be screened for sexually transmitted infections (STIs) including gonorrhea and chlamydia if: ? You are sexually active and are younger than 62 years of age. ? You are older than 62 years of age and your health care provider tells you that you are at risk for this type of infection. ? Your sexual activity has changed since you   were last screened and you are at an increased risk for chlamydia or gonorrhea. Ask your health care provider if you are at risk.  If you do not have HIV, but are at risk, it may be recommended that you take a prescription medicine daily to prevent HIV infection. This is called pre-exposure prophylaxis (PrEP). You are considered at risk if: ? You are sexually active and do not regularly use condoms or know the HIV status of your partner(s). ? You take drugs by injection. ? You are sexually active with a partner who has HIV. Talk with your health care provider about whether you are at high risk of being infected with HIV. If you choose to begin PrEP, you should first be tested for HIV. You should then be tested every 3 months for as long as you are taking PrEP. Pregnancy  If you are premenopausal and you may become pregnant, ask your health care provider about preconception counseling.  If you may become pregnant, take 400 to 800 micrograms (mcg) of folic acid every day.  If you want to prevent pregnancy, talk to your health care provider about birth control (contraception). Osteoporosis and menopause  Osteoporosis is a disease in which the bones lose minerals and strength with aging. This can result in serious bone fractures. Your risk for osteoporosis can be identified using a bone density scan.  If you are 65 years of age or older, or if you are at risk for osteoporosis and fractures, ask your health care  provider if you should be screened.  Ask your health care provider whether you should take a calcium or vitamin D supplement to lower your risk for osteoporosis.  Menopause may have certain physical symptoms and risks.  Hormone replacement therapy may reduce some of these symptoms and risks. Talk to your health care provider about whether hormone replacement therapy is right for you. Follow these instructions at home:  Schedule regular health, dental, and eye exams.  Stay current with your immunizations.  Do not use any tobacco products including cigarettes, chewing tobacco, or electronic cigarettes.  If you are pregnant, do not drink alcohol.  If you are breastfeeding, limit how much and how often you drink alcohol.  Limit alcohol intake to no more than 1 drink per day for nonpregnant women. One drink equals 12 ounces of beer, 5 ounces of wine, or 1 ounces of hard liquor.  Do not use street drugs.  Do not share needles.  Ask your health care provider for help if you need support or information about quitting drugs.  Tell your health care provider if you often feel depressed.  Tell your health care provider if you have ever been abused or do not feel safe at home. This information is not intended to replace advice given to you by your health care provider. Make sure you discuss any questions you have with your health care provider. Document Released: 01/14/2011 Document Revised: 12/07/2015 Document Reviewed: 04/04/2015 [employer] Interactive Patient Education  2019 [employer]  

## 2018-08-26 NOTE — Progress Notes (Signed)
Sheri Adkins 63 y.o.   Chief Complaint  Patient presents with  . Medication Refill    Synthroid, Flonase, Axert and Paxil    HISTORY OF PRESENT ILLNESS: This is a 63 y.o. female with multiple medical problems, patient of Sheri Adkins, here for medication refills.  Has no complaints or medical concerns today.  HPI   Prior to Admission medications   Medication Sig Start Date End Date Taking? Authorizing Provider  almotriptan (AXERT) 6.25 MG tablet Take 1 tablet (6.25 mg total) by mouth as needed for migraine. 08/26/18  Yes Sheri Adkins, Sheri Bloomer, MD  fluticasone [facility]) 50 MCG/ACT nasal spray Place 2 sprays into both nostrils daily. 08/26/18  Yes Sheri Adkins, Sheri Bloomer, MD  levothyroxine (SYNTHROID, LEVOTHROID) 88 MCG tablet Take 1 tablet (88 mcg total) by mouth daily. 08/26/18 11/24/18 Yes Sheri Adkins, Sheri Bloomer, MD  PARoxetine (PAXIL) 40 MG tablet Take 1 tablet (40 mg total) by mouth daily. 08/26/18 11/24/18 Yes Sheri Adkins, Sheri Bloomer, MD  azelastine (OPTIVAR) 0.05 % ophthalmic solution Place 1 drop into both eyes 2 (two) times daily. Patient not taking: Reported on 08/26/2018 01/02/18   Sheri Eagles, PA-C  ibuprofen (ADVIL,MOTRIN) 600 MG tablet Take 1 tablet (600 mg total) by mouth every 8 (eight) hours as needed. Patient not taking: Reported on 08/26/2018 09/19/17   Sheri Guys, MD    Allergies  Allergen Reactions  . Penicillins     Has patient had a PCN reaction causing immediate rash, facial/tongue/throat swelling, SOB or lightheadedness with hypotension: Yes Has patient had a PCN reaction causing severe rash involving mucus membranes or skin necrosis: No Has patient had a PCN reaction that required hospitalization No Has patient had a PCN reaction occurring within the last 10 years: No If all of the above answers are "NO", then may proceed with Cephalosporin use.   . Sulfa Antibiotics     Rash    Patient Active Problem List   Diagnosis Date Noted  . Osteopenia of neck of right  femur 08/07/2017  . Cervical spondylosis 03/11/2014  . Migraine variants, not intractable 03/11/2014  . Hypothyroidism 11/02/2013  . Depression 11/02/2013  . Elevated LDL cholesterol level 11/02/2013    Past Medical History:  Diagnosis Date  . Allergy   . Arthritis   . Depression   . Migraine   . Thyroid disease     No past surgical history on file.  Social History   Socioeconomic History  . Marital status: Single    Spouse name: Not on file  . Number of children: Not on file  . Years of education: Not on file  . Highest education level: Not on file  Occupational History  . Occupation: Probation officer    Comment: Futures trader for [employer]  . Financial resource strain: Not on file  . Food insecurity:    Worry: Not on file    Inability: Not on file  . Transportation needs:    Medical: Not on file    Non-medical: Not on file  Tobacco Use  . Smoking status: Never Smoker  . Smokeless tobacco: Never Used  Substance and Sexual Activity  . Alcohol use: Yes    Alcohol/week: 3.0 standard drinks    Types: 3 Standard drinks or equivalent per week    Comment: 2 a week  . Drug use: No  . Sexual activity: Not on file  Lifestyle  . Physical activity:    Days per week: Not on file    Minutes  per session: Not on file  . Stress: Not on file  Relationships  . Social connections:    Talks on phone: Not on file    Gets together: Not on file    Attends religious service: Not on file    Active member of club or organization: Not on file    Attends meetings of clubs or organizations: Not on file    Relationship status: Not on file  . Intimate partner violence:    Fear of current or ex partner: Not on file    Emotionally abused: Not on file    Physically abused: Not on file    Forced sexual activity: Not on file  Other Topics Concern  . Not on file  Social History Narrative   Divorced. No exercise. Education, other    Family History  Problem Relation Age of  Onset  . Hypertension Mother   . Stroke Mother   . Mental illness Mother   . Cancer Sister        ovarian cancer  . Mental illness Sister   . Cancer Paternal Grandmother        breast  . Heart disease Paternal Grandfather   . Mental illness Brother   . Cancer Maternal Grandmother   . Mental illness Maternal Grandmother   . Mental illness Brother   . Mental illness Sister      Review of Systems  Constitutional: Negative.  Negative for chills, fever and weight loss.  HENT: Negative for nosebleeds.   Eyes: Negative for blurred vision and double vision.  Respiratory: Negative for cough and shortness of breath.   Cardiovascular: Negative for chest pain and palpitations.  Gastrointestinal: Negative for abdominal pain, blood in stool, diarrhea, nausea and vomiting.  Skin: Negative.  Negative for rash.  Neurological: Positive for headaches (History of migraines).  Psychiatric/Behavioral: Positive for depression (Chronic).  All other systems reviewed and are negative.  Vitals:   08/26/18 1012  BP: 117/73  Adkins: 73  Resp: 16  Temp: 98.9 F (37.2 C)  SpO2: 97%     Physical Exam Constitutional:      Appearance: Normal appearance.  HENT:     Head: Normocephalic.  Eyes:     Extraocular Movements: Extraocular movements intact.  Cardiovascular:     Rate and Rhythm: Normal rate and regular rhythm.     Heart sounds: Normal heart sounds.  Pulmonary:     Effort: Pulmonary effort is normal.     Breath sounds: Normal breath sounds.  Musculoskeletal: Normal range of motion.  Skin:    General: Skin is warm and dry.  Neurological:     General: No focal deficit present.     Mental Status: She is alert and oriented to person, place, and time.      ASSESSMENT & PLAN: Sheri Adkins was seen today for medication refill.  Diagnoses and all orders for this visit:  Acquired hypothyroidism -     Thyroid Panel With TSH -     levothyroxine (SYNTHROID, LEVOTHROID) 88 MCG tablet; Take 1  tablet (88 mcg total) by mouth daily.  Colon cancer screening -     Cologuard  Environmental allergies -     fluticasone (FLONASE) 50 MCG/ACT nasal spray; Place 2 sprays into both nostrils daily.  Migraine variants, not intractable -     almotriptan (AXERT) 6.25 MG tablet; Take 1 tablet (6.25 mg total) by mouth as needed for migraine.  Recurrent major depressive disorder, in full remission ([place]) -  PARoxetine (PAXIL) 40 MG tablet; Take 1 tablet (40 mg total) by mouth daily.   Patient Instructions       If you have lab work done today you will be contacted with your lab results within the next 2 weeks.  If you have not heard from [place] then please contact us. The fastest way to get your results is to register for My Chart.   IF you received an x-ray today, you will receive an invoice from [facility]. Please contact [facility] at [phone] with questions or concerns regarding your invoice.   IF you received labwork today, you will receive an invoice from [facility]. Please contact [facility] at [phone] with questions or concerns regarding your invoice.   Our billing staff will not be able to assist you with questions regarding bills from these companies.  You will be contacted with the lab results as soon as they are available. The fastest way to get your results is to activate your My Chart account. Instructions are located on the last page of this paperwork. If you have not heard from [place] regarding the results in 2 weeks, please contact this office.    Health Maintenance, Female Adopting a healthy lifestyle and getting preventive care can go a long way to promote health and wellness. Talk with your health care provider about what schedule of regular examinations is right for you. This is a good chance for you to check in with your provider about disease prevention and staying healthy. In between checkups, there are plenty of things you can do on your own.  Experts have done a lot of research about which lifestyle changes and preventive measures are most likely to keep you healthy. Ask your health care provider for more information. Weight and diet Eat a healthy diet  Be sure to include plenty of vegetables, fruits, low-fat dairy products, and lean protein.  Do not eat a lot of foods high in solid fats, added sugars, or salt.  Get regular exercise. This is one of the most important things you can do for your health. ? Most adults should exercise for at least 150 minutes each week. The exercise should increase your heart rate and make you sweat (moderate-intensity exercise). ? Most adults should also do strengthening exercises at least twice a week. This is in addition to the moderate-intensity exercise. Maintain a healthy weight  Body mass index (BMI) is a measurement that can be used to identify possible weight problems. It estimates body fat based on height and weight. Your health care provider can help determine your BMI and help you achieve or maintain a healthy weight.  For females 73 years of age and older: ? A BMI below 18.5 is considered underweight. ? A BMI of 18.5 to 24.9 is normal. ? A BMI of 25 to 29.9 is considered overweight. ? A BMI of 30 and above is considered obese. Watch levels of cholesterol and blood lipids  You should start having your blood tested for lipids and cholesterol at 62 years of age, then have this test every 5 years.  You may need to have your cholesterol levels checked more often if: ? Your lipid or cholesterol levels are high. ? You are older than 62 years of age. ? You are at high risk for heart disease. Cancer screening Lung Cancer  Lung cancer screening is recommended for adults 64-95 years old who are at high risk for lung cancer because of a history of smoking.  A yearly  low-dose CT scan of the lungs is recommended for people who: ? Currently smoke. ? Have quit within the past 15 years. ? Have  at least a 30-pack-year history of smoking. A pack year is smoking an average of one pack of cigarettes a day for 1 year.  Yearly screening should continue until it has been 15 years since you quit.  Yearly screening should stop if you develop a health problem that would prevent you from having lung cancer treatment. Breast Cancer  Practice breast self-awareness. This means understanding how your breasts normally appear and feel.  It also means doing regular breast self-exams. Let your health care provider know about any changes, no matter how small.  If you are in your 20s or 30s, you should have a clinical breast exam (CBE) by a health care provider every 1-3 years as part of a regular health exam.  If you are 15 or older, have a CBE every year. Also consider having a breast X-ray (mammogram) every year.  If you have a family history of breast cancer, talk to your health care provider about genetic screening.  If you are at high risk for breast cancer, talk to your health care provider about having an MRI and a mammogram every year.  Breast cancer gene (BRCA) assessment is recommended for women who have family members with BRCA-related cancers. BRCA-related cancers include: ? Breast. ? Ovarian. ? Tubal. ? Peritoneal cancers.  Results of the assessment will determine the need for genetic counseling and BRCA1 and BRCA2 testing. Cervical Cancer Your health care provider may recommend that you be screened regularly for cancer of the pelvic organs (ovaries, uterus, and vagina). This screening involves a pelvic examination, including checking for microscopic changes to the surface of your cervix (Pap test). You may be encouraged to have this screening done every 3 years, beginning at age 46.  For women ages 52-65, health care providers may recommend pelvic exams and Pap testing every 3 years, or they may recommend the Pap and pelvic exam, combined with testing for human papilloma virus  (HPV), every 5 years. Some types of HPV increase your risk of cervical cancer. Testing for HPV may also be done on women of any age with unclear Pap test results.  Other health care providers may not recommend any screening for nonpregnant women who are considered low risk for pelvic cancer and who do not have symptoms. Ask your health care provider if a screening pelvic exam is right for you.  If you have had past treatment for cervical cancer or a condition that could lead to cancer, you need Pap tests and screening for cancer for at least 20 years after your treatment. If Pap tests have been discontinued, your risk factors (such as having a new sexual partner) need to be reassessed to determine if screening should resume. Some women have medical problems that increase the chance of getting cervical cancer. In these cases, your health care provider may recommend more frequent screening and Pap tests. Colorectal Cancer  This type of cancer can be detected and often prevented.  Routine colorectal cancer screening usually begins at 62 years of age and continues through 62 years of age.  Your health care provider may recommend screening at an earlier age if you have risk factors for colon cancer.  Your health care provider may also recommend using home test kits to check for hidden blood in the stool.  A small camera at the end of a tube can be  used to examine your colon directly (sigmoidoscopy or colonoscopy). This is done to check for the earliest forms of colorectal cancer.  Routine screening usually begins at age 67.  Direct examination of the colon should be repeated every 5-10 years through 62 years of age. However, you may need to be screened more often if early forms of precancerous polyps or small growths are found. Skin Cancer  Check your skin from head to toe regularly.  Tell your health care provider about any new moles or changes in moles, especially if there is a change in a  mole's shape or color.  Also tell your health care provider if you have a mole that is larger than the size of a pencil eraser.  Always use sunscreen. Apply sunscreen liberally and repeatedly throughout the day.  Protect yourself by wearing long sleeves, pants, a wide-brimmed hat, and sunglasses whenever you are outside. Heart disease, diabetes, and high blood pressure  High blood pressure causes heart disease and increases the risk of stroke. High blood pressure is more likely to develop in: ? People who have blood pressure in the high end of the normal range (130-139/85-89 mm Hg). ? People who are overweight or obese. ? People who are African American.  If you are 4-7 years of age, have your blood pressure checked every 3-5 years. If you are 61 years of age or older, have your blood pressure checked every year. You should have your blood pressure measured twice-once when you are at a hospital or clinic, and once when you are not at a hospital or clinic. Record the average of the two measurements. To check your blood pressure when you are not at a hospital or clinic, you can use: ? An automated blood pressure machine at a pharmacy. ? A home blood pressure monitor.  If you are between 12 years and 50 years old, ask your health care provider if you should take aspirin to prevent strokes.  Have regular diabetes screenings. This involves taking a blood sample to check your fasting blood sugar level. ? If you are at a normal weight and have a low risk for diabetes, have this test once every three years after 62 years of age. ? If you are overweight and have a high risk for diabetes, consider being tested at a younger age or more often. Preventing infection Hepatitis B  If you have a higher risk for hepatitis B, you should be screened for this virus. You are considered at high risk for hepatitis B if: ? You were born in a country where hepatitis B is common. Ask your health care provider  which countries are considered high risk. ? Your parents were born in a high-risk country, and you have not been immunized against hepatitis B (hepatitis B vaccine). ? You have HIV or AIDS. ? You use needles to inject street drugs. ? You live with someone who has hepatitis B. ? You have had sex with someone who has hepatitis B. ? You get hemodialysis treatment. ? You take certain medicines for conditions, including cancer, organ transplantation, and autoimmune conditions. Hepatitis C  Blood testing is recommended for: ? Everyone born from [birth year] through 1964. ? Anyone with known risk factors for hepatitis C. Sexually transmitted infections (STIs)  You should be screened for sexually transmitted infections (STIs) including gonorrhea and chlamydia if: ? You are sexually active and are younger than 62 years of age. ? You are older than 62 years of age and your health  care provider tells you that you are at risk for this type of infection. ? Your sexual activity has changed since you were last screened and you are at an increased risk for chlamydia or gonorrhea. Ask your health care provider if you are at risk.  If you do not have HIV, but are at risk, it may be recommended that you take a prescription medicine daily to prevent HIV infection. This is called pre-exposure prophylaxis (PrEP). You are considered at risk if: ? You are sexually active and do not regularly use condoms or know the HIV status of your partner(s). ? You take drugs by injection. ? You are sexually active with a partner who has HIV. Talk with your health care provider about whether you are at high risk of being infected with HIV. If you choose to begin PrEP, you should first be tested for HIV. You should then be tested every 3 months for as long as you are taking PrEP. Pregnancy  If you are premenopausal and you may become pregnant, ask your health care provider about preconception counseling.  If you may become pregnant,  take 400 to 800 micrograms (mcg) of folic acid every day.  If you want to prevent pregnancy, talk to your health care provider about birth control (contraception). Osteoporosis and menopause  Osteoporosis is a disease in which the bones lose minerals and strength with aging. This can result in serious bone fractures. Your risk for osteoporosis can be identified using a bone density scan.  If you are 7 years of age or older, or if you are at risk for osteoporosis and fractures, ask your health care provider if you should be screened.  Ask your health care provider whether you should take a calcium or vitamin D supplement to lower your risk for osteoporosis.  Menopause may have certain physical symptoms and risks.  Hormone replacement therapy may reduce some of these symptoms and risks. Talk to your health care provider about whether hormone replacement therapy is right for you. Follow these instructions at home:  Schedule regular health, dental, and eye exams.  Stay current with your immunizations.  Do not use any tobacco products including cigarettes, chewing tobacco, or electronic cigarettes.  If you are pregnant, do not drink alcohol.  If you are breastfeeding, limit how much and how often you drink alcohol.  Limit alcohol intake to no more than 1 drink per day for nonpregnant women. One drink equals 12 ounces of beer, 5 ounces of wine, or 1 ounces of hard liquor.  Do not use street drugs.  Do not share needles.  Ask your health care provider for help if you need support or information about quitting drugs.  Tell your health care provider if you often feel depressed.  Tell your health care provider if you have ever been abused or do not feel safe at home. This information is not intended to replace advice given to you by your health care provider. Make sure you discuss any questions you have with your health care provider. Document Released: 01/14/2011 Document Revised:  12/07/2015 Document Reviewed: 04/04/2015 Elsevier Interactive Patient Education  2019 [employer]      Agustina Caroli, MD [facility]

## 2018-08-27 LAB — THYROID PANEL WITH TSH
Free Thyroxine Index: 1.9 (ref 1.2–4.9)
T3 Uptake Ratio: 26 % (ref 24–39)
T4 TOTAL: 7.4 ug/dL (ref 4.5–12.0)
TSH: 1.76 u[IU]/mL (ref 0.450–4.500)

## 2019-04-30 ENCOUNTER — Encounter: Payer: Self-pay | Admitting: Family Medicine

## 2019-04-30 ENCOUNTER — Other Ambulatory Visit: Payer: Self-pay

## 2019-04-30 ENCOUNTER — Telehealth (INDEPENDENT_AMBULATORY_CARE_PROVIDER_SITE_OTHER): Payer: Commercial Managed Care - PPO | Admitting: Family Medicine

## 2019-04-30 DIAGNOSIS — F3342 Major depressive disorder, recurrent, in full remission: Secondary | ICD-10-CM | POA: Diagnosis not present

## 2019-04-30 DIAGNOSIS — Z9109 Other allergy status, other than to drugs and biological substances: Secondary | ICD-10-CM | POA: Diagnosis not present

## 2019-04-30 DIAGNOSIS — E039 Hypothyroidism, unspecified: Secondary | ICD-10-CM | POA: Diagnosis not present

## 2019-04-30 MED ORDER — FLUTICASONE PROPIONATE 50 MCG/ACT NA SUSP: 2.0000 | Freq: Every day | NASAL | 5 refills | Status: DC

## 2019-04-30 MED ORDER — PAROXETINE HCL 40 MG PO TABS: 40.0000 mg | ORAL_TABLET | Freq: Every day | ORAL | 1 refills | Status: DC

## 2019-04-30 MED ORDER — LEVOTHYROXINE SODIUM 88 MCG PO TABS: 88.0000 ug | ORAL_TABLET | Freq: Every day | ORAL | 1 refills | Status: DC

## 2019-04-30 MED ORDER — FLUTICASONE PROPIONATE 50 MCG/ACT NA SUSP: 2.0000 | Freq: Every day | NASAL | 1 refills | Status: DC

## 2019-04-30 NOTE — Progress Notes (Signed)
Virtual Visit Note  I connected with patient on 04/30/19 at 502pm by phone and verified that I am speaking with the correct person using two identifiers. Sheri Adkins is currently located at home and patient is currently with them during visit. The provider, Rutherford Guys, MD is located in their office at time of visit.  I discussed the limitations, risks, security and privacy concerns of performing an evaluation and management service by telephone and the availability of in person appointments. I also discussed with the patient that there may be a patient responsible charge related to this service. The patient expressed understanding and agreed to proceed.   CC: requesting refill of flonase  HPI   Last OV feb 2020 ? She has been doing really well on flonase at night It helps her not to wake up with sinus headaches and therefore prevents her from getting migraines She has also been vacuuming more Working from home - [employer] desk  Has been on current dose of levothyroxine for past several years Denies any missed doses Denies any fatigue, mental fogginess, changes in temp tolerance, changes in hair/nail, weight change, obstructive sx  Lab Results  Component Value Date   TSH 1.760 08/26/2018   Feels depression is well controlled on paxil Has started a daily meditation which has really helped her not be reactionary and overall be calmer She does acknowledge that she does need to exercise more/walk more/be outside more as that helps her mood as well  She otherwise has no acute concerns today  Allergies  Allergen Reactions  . Penicillins     Has patient had a PCN reaction causing immediate rash, facial/tongue/throat swelling, SOB or lightheadedness with hypotension: Yes Has patient had a PCN reaction causing severe rash involving mucus membranes or skin necrosis: No Has patient had a PCN reaction that required hospitalization No Has patient had a PCN reaction  occurring within the last 10 years: No If all of the above answers are "NO", then may proceed with Cephalosporin use.   . Sulfa Antibiotics     Rash    Prior to Admission medications   Medication Sig Start Date End Date Taking? Authorizing Provider  almotriptan (AXERT) 6.25 MG tablet Take 1 tablet (6.25 mg total) by mouth as needed for migraine. 08/26/18   Horald Pollen, MD  azelastine (OPTIVAR) 0.05 % ophthalmic solution Place 1 drop into both eyes 2 (two) times daily. Patient not taking: Reported on 08/26/2018 01/02/18   Jaynee Eagles, PA-C  fluticasone [facility]) 50 MCG/ACT nasal spray Place 2 sprays into both nostrils daily. 08/26/18   Horald Pollen, MD  ibuprofen (ADVIL,MOTRIN) 600 MG tablet Take 1 tablet (600 mg total) by mouth every 8 (eight) hours as needed. Patient not taking: Reported on 08/26/2018 09/19/17   Rutherford Guys, MD  levothyroxine (SYNTHROID, LEVOTHROID) 88 MCG tablet Take 1 tablet (88 mcg total) by mouth daily. 08/26/18 11/24/18  Horald Pollen, MD  PARoxetine (PAXIL) 40 MG tablet Take 1 tablet (40 mg total) by mouth daily. 08/26/18 11/24/18  Horald Pollen, MD    Past Medical History:  Diagnosis Date  . Allergy   . Arthritis   . Depression   . Migraine   . Thyroid disease     History reviewed. No pertinent surgical history.  Social History   Tobacco Use  . Smoking status: Never Smoker  . Smokeless tobacco: Never Used  Substance Use Topics  . Alcohol use: Yes    Alcohol/week:  3.0 standard drinks    Types: 3 Standard drinks or equivalent per week    Comment: 2 a week    Family History  Problem Relation Age of Onset  . Hypertension Mother   . Stroke Mother   . Mental illness Mother   . Cancer Sister        ovarian cancer  . Mental illness Sister   . Cancer Paternal Grandmother        breast  . Heart disease Paternal Grandfather   . Mental illness Brother   . Cancer Maternal Grandmother   . Mental illness Maternal  Grandmother   . Mental illness Brother   . Mental illness Sister     ROS Per hpi  Objective  Vitals as reported by the patient: none   ASSESSMENT and PLAN  1. Environmental allergies Stable. Continue current regime.  - fluticasone (FLONASE) 50 MCG/ACT nasal spray; Place 2 sprays into both nostrils daily.  2. Acquired hypothyroidism Stable. Last TSH at goal. Continue current regime.  - levothyroxine (SYNTHROID) 88 MCG tablet; Take 1 tablet (88 mcg total) by mouth daily.  3. Recurrent major depressive disorder, in full remission ([place]) Stable. Continue current mgt. - PARoxetine (PAXIL) 40 MG tablet; Take 1 tablet (40 mg total) by mouth daily.  FOLLOW-UP: Feb 2021   The above assessment and management plan was discussed with the patient. The patient verbalized understanding of and has agreed to the management plan. Patient is aware to call the clinic if symptoms persist or worsen. Patient is aware when to return to the clinic for a follow-up visit. Patient educated on when it is appropriate to go to the emergency department.    I provided 13 minutes of non-face-to-face time during this encounter.  Rutherford Guys, MD [facility], [place] [address] Ph.  [phone] Fax [phone]

## 2019-04-30 NOTE — Progress Notes (Signed)
Needing a refill on flonase, no other medical concerns

## 2019-07-16 DIAGNOSIS — Z9289 Personal history of other medical treatment: Secondary | ICD-10-CM

## 2019-07-16 HISTORY — DX: Personal history of other medical treatment: Z92.89

## 2019-10-11 ENCOUNTER — Other Ambulatory Visit: Payer: Self-pay | Admitting: Family Medicine

## 2019-10-11 DIAGNOSIS — Z9109 Other allergy status, other than to drugs and biological substances: Secondary | ICD-10-CM

## 2019-10-20 ENCOUNTER — Other Ambulatory Visit: Payer: Self-pay | Admitting: Emergency Medicine

## 2019-10-20 DIAGNOSIS — Z9109 Other allergy status, other than to drugs and biological substances: Secondary | ICD-10-CM

## 2019-10-25 ENCOUNTER — Ambulatory Visit (INDEPENDENT_AMBULATORY_CARE_PROVIDER_SITE_OTHER): Payer: 59 | Admitting: Family Medicine

## 2019-10-25 ENCOUNTER — Other Ambulatory Visit (HOSPITAL_COMMUNITY)
Admission: RE | Admit: 2019-10-25 | Discharge: 2019-10-25 | Disposition: A | Discharge status: Home / Self Care | Payer: 59 | Source: Ambulatory Visit | Attending: Family Medicine | Admitting: Family Medicine

## 2019-10-25 ENCOUNTER — Other Ambulatory Visit: Payer: Self-pay

## 2019-10-25 ENCOUNTER — Encounter: Payer: Self-pay | Admitting: Family Medicine

## 2019-10-25 VITALS — BP 117/71 | HR 79 | Temp 98.2°F | Ht 63.5 in | Wt 142.0 lb

## 2019-10-25 DIAGNOSIS — Z0001 Encounter for general adult medical examination with abnormal findings: Secondary | ICD-10-CM | POA: Diagnosis not present

## 2019-10-25 DIAGNOSIS — E039 Hypothyroidism, unspecified: Secondary | ICD-10-CM

## 2019-10-25 DIAGNOSIS — Z1211 Encounter for screening for malignant neoplasm of colon: Secondary | ICD-10-CM

## 2019-10-25 DIAGNOSIS — Z1239 Encounter for other screening for malignant neoplasm of breast: Secondary | ICD-10-CM

## 2019-10-25 DIAGNOSIS — Z9109 Other allergy status, other than to drugs and biological substances: Secondary | ICD-10-CM

## 2019-10-25 DIAGNOSIS — Z0000 Encounter for general adult medical examination without abnormal findings: Secondary | ICD-10-CM

## 2019-10-25 DIAGNOSIS — Z8041 Family history of malignant neoplasm of ovary: Secondary | ICD-10-CM

## 2019-10-25 DIAGNOSIS — F3342 Major depressive disorder, recurrent, in full remission: Secondary | ICD-10-CM

## 2019-10-25 MED ORDER — FLUTICASONE PROPIONATE 50 MCG/ACT NA SUSP: 2.0000 | Freq: Every day | NASAL | 3 refills | Status: DC

## 2019-10-25 MED ORDER — PAROXETINE HCL 40 MG PO TABS: 40.0000 mg | ORAL_TABLET | Freq: Every day | ORAL | 3 refills | Status: DC

## 2019-10-25 NOTE — Patient Instructions (Addendum)
We recommend that you schedule a mammogram for breast cancer screening. Typically, you do not need a referral to do this. Please contact a local imaging center to schedule your mammogram.   [facility] ([place]) - [phone] or [phone]    If you have lab work done today you will be contacted with your lab results within the next 2 weeks.  If you have not heard from [place] then please contact us. The fastest way to get your results is to register for My Chart.   IF you received an x-ray today, you will receive an invoice from [facility]. Please contact [facility] at [phone] with questions or concerns regarding your invoice.   IF you received labwork today, you will receive an invoice from [facility]. Please contact [facility] at [phone] with questions or concerns regarding your invoice.   Our billing staff will not be able to assist you with questions regarding bills from these companies.  You will be contacted with the lab results as soon as they are available. The fastest way to get your results is to activate your My Chart account. Instructions are located on the last page of this paperwork. If you have not heard from [place] regarding the results in 2 weeks, please contact this office.      Ovarian Cancer  Ovarian cancer is an abnormal growth of cells that forms a mass (malignant tumor) on one or both ovaries. The ovaries are the parts of the female reproductive system that produce eggs. Women have two ovaries. They are located on either side of the uterus. What are the causes? The cause of ovarian cancer is not known. What increases the risk? You are more likely to develop this condition if you:  Are 23 or older.  Have a personal or family history of endometrial, colon, breast, or ovarian cancer.  Have the genes associated with breast and ovarian cancer (BRCA1 and BRCA2).  Have used fertility medicines.  Started menstruating  before age 36.  Started menopause when you were older than 63.  Became pregnant for the first time at age 47 or older.  Have never been pregnant.  Have had hormone replacement therapy.  Are overweight.  Have certain inherited genetic conditions that raise the risk of cancer, such as Lynch syndrome.  Have a history of polycystic ovarian syndrome.  Have tissues from the uterus growing outside of the uterus (endometriosis). What are the signs or symptoms? In the early stages, ovarian cancer often does not cause symptoms. As the cancer grows, symptoms may include:  Unexplained weight loss.  Pain, swelling, and bloating in the abdomen.  Pain and pressure in your back and pelvis.  Abnormal bleeding from the vagina.  Loss of appetite.  Passing urine often.  Pain during sex.  Fatigue. How is this diagnosed? This condition may be diagnosed based on:  Your medical history and a physical exam.  A pelvic and abdominal exam to check your ovaries, uterus, vulva, cervix, vagina, bladder, rectum, and fallopian tubes.  Tests, such as: ? An imaging test that uses sound waves to take pictures of your uterus, bladder, ovaries, and fallopian tubes (transvaginal ultrasound). For this test, a sound wave probe is inserted into your vagina. ? CT scan, PET scan, or MRI. ? X-rays of the colon and rectum. ? Blood tests. ? Taking a small piece of tissue to examine it under a microscope (biopsy). ? Removing built-up fluid from the abdomen (ascites) to be examined under a  microscope (paracentesis). Your cancer will be assessed (staged) based on how severe it is and how much it has spread. How is this treated? This condition may be treated with one or more of the following:  Surgery to remove one ovary and its fallopian tube (oophorectomy). This may be done to treat cancer in its early stages.  Surgery to remove the uterus, cervix, fallopian tubes, and ovaries (hysterectomy with bilateral  salpingo-oophorectomy). Lymph tissue (lymph nodes) near the tumor and some tissue and fluid from the abdomen may also be removed and analyzed for cancer cells. This is done to treat advanced cancer.  Chemotherapy. This uses medicines to kill the cancer cells. Chemotherapy may be used before or after surgery.  Intraperitoneal chemotherapy (IP chemotherapy). Chemotherapy is given directly into the abdomen (peritoneum) through a special tube.  Targeted therapy. This uses drugs to attack specific areas within cancer cells to kill the cells or stop them from growing. Follow these instructions at home: Lifestyle  Do not use any products that contain nicotine or tobacco, such as cigarettes and e-cigarettes. If you need help quitting, ask your health care provider.  Do moderate exercise regularly, as told by your health care provider.  Try to eat regular, healthy meals. Some of your treatments might affect your appetite. If you are having problems eating or with your appetite, ask to meet with a food and nutrition specialist (dietitian).  Consider joining a support group with others who have cancer. A support group may help you with resources and information to help you cope with your cancer. General instructions  Take over-the-counter and prescription medicines only as told by your health care provider.  Do not drive or use heavy machinery while taking prescription pain medicine.  If you are taking prescription pain medicine, take actions to prevent or treat constipation. Your health care provider may recommend that you: ? Drink enough fluid to keep your urine pale yellow. ? Eat foods that are high in fiber, such as fresh fruits and vegetables, whole grains, and beans. ? Limit foods that are high in fat and processed sugars, such as fried or sweet foods. ? Take an over-the-counter or prescription medicine for constipation.  You may need to have regular blood tests and imaging tests to monitor  your response to treatment.  Keep all follow-up visits as told by your health care provider. This is important. Where to find more information  American Cancer Society: www.cancer.[place]: www.cancer.gov Contact a health care provider if you:  Are not able to follow a prescribed treatment plan or take a medicine.  Have any symptoms or changes that concern you.  Have changes in your bowel or bladder habits. Get help right away if you have:  A fever or chills.  Serious side effects or an allergic reaction to a treatment or medicine.  Back pain that is new.  Increased pain, swelling, or bloating in your abdomen. Summary  Ovarian cancer is an abnormal growth of cells that forms a mass (malignant tumor) on one or both ovaries.  Symptoms of ovarian cancer may include pain in the abdomen, back, or pelvis. You may also have a loss of appetite and swelling or bloating in the abdomen.  You are more likely to develop ovarian cancer if you are older than 47, but it can affect younger women, particularly those with the BRCA1 or BRCA2 genes. These genes are associated with breast and ovarian cancer.  Treatment may include a combination of surgery and  medicines that kill cancer cells (chemotherapy).  You may need to have regular blood tests and imaging tests to monitor your response to treatment. This information is not intended to replace advice given to you by your health care provider. Make sure you discuss any questions you have with your health care provider. Document Revised: 10/22/2018 Document Reviewed: 07/23/2017 Elsevier Patient Education  [place].

## 2019-10-25 NOTE — Progress Notes (Signed)
Chief Complaint  Patient presents with  . Hypothyroidism    Subjective:  Sheri Adkins is a 64 y.o. female here for a health maintenance visit.  Patient is established pt  Patient Active Problem List   Diagnosis Date Noted  . Osteopenia of neck of right femur 08/07/2017  . Cervical spondylosis 03/11/2014  . Migraine variants, not intractable 03/11/2014  . Hypothyroidism 11/02/2013  . Depression 11/02/2013  . Elevated LDL cholesterol level 11/02/2013    Past Medical History:  Diagnosis Date  . Allergy   . Arthritis   . Depression   . Migraine   . Thyroid disease     No past surgical history on file.   Outpatient Medications Prior to Visit  Medication Sig Dispense Refill  . levothyroxine (SYNTHROID) 88 MCG tablet Take 1 tablet (88 mcg total) by mouth daily. 90 tablet 1  . fluticasone (FLONASE) 50 MCG/ACT nasal spray USE 2 SPRAYS IN EACH NOSTRIL DAILY 48 g 3  . PARoxetine (PAXIL) 40 MG tablet Take 1 tablet (40 mg total) by mouth daily. 90 tablet 1  . almotriptan (AXERT) 6.25 MG tablet Take 1 tablet (6.25 mg total) by mouth as needed for migraine. (Patient not taking: Reported on 10/25/2019) 27 tablet 2  . aspirin-acetaminophen-caffeine (EXCEDRIN MIGRAINE) 390-300-92 MG tablet Take by mouth every 6 (six) hours as needed for headache.    Marland Kitchen azelastine (OPTIVAR) 0.05 % ophthalmic solution Place 1 drop into both eyes 2 (two) times daily. (Patient not taking: Reported on 08/26/2018) 6 mL 12  . ibuprofen (ADVIL,MOTRIN) 600 MG tablet Take 1 tablet (600 mg total) by mouth every 8 (eight) hours as needed. (Patient not taking: Reported on 08/26/2018) 30 tablet 0   No facility-administered medications prior to visit.    Allergies  Allergen Reactions  . Penicillins     Has patient had a PCN reaction causing immediate rash, facial/tongue/throat swelling, SOB or lightheadedness with hypotension: Yes Has patient had a PCN reaction causing severe rash involving mucus membranes or skin  necrosis: No Has patient had a PCN reaction that required hospitalization No Has patient had a PCN reaction occurring within the last 10 years: No If all of the above answers are "NO", then may proceed with Cephalosporin use.   . Sulfa Antibiotics     Rash     Family History  Problem Relation Age of Onset  . Hypertension Mother   . Stroke Mother   . Mental illness Mother   . Cancer Sister        ovarian cancer  . Mental illness Sister   . Cancer Paternal Grandmother        breast  . Heart disease Paternal Grandfather   . Mental illness Brother   . Cancer Maternal Grandmother   . Mental illness Maternal Grandmother   . Mental illness Brother   . Mental illness Sister      Health Habits: Dental Exam: up to date Eye Exam: up to date Exercise: 4 times/week on average Current exercise activities: walking/running Diet: balanced  Social History   Socioeconomic History  . Marital status: Single    Spouse name: Not on file  . Number of children: Not on file  . Years of education: Not on file  . Highest education level: Not on file  Occupational History  . Occupation: Probation officer    Comment: Futures trader for [employer]  Tobacco Use  . Smoking status: Never Smoker  . Smokeless tobacco: Never Used  Substance and Sexual Activity  .  Alcohol use: Yes    Alcohol/week: 3.0 standard drinks    Types: 3 Standard drinks or equivalent per week    Comment: 2 a week  . Drug use: No  . Sexual activity: Not on file  Other Topics Concern  . Not on file  Social History Narrative   Divorced. No exercise. Education, other   Social Determinants of Health   Financial Resource Strain:   . Difficulty of Paying Living Expenses:   Food Insecurity:   . Worried About Charity fundraiser in the Last Year:   . Arboriculturist in the Last Year:   Transportation Needs:   . Film/video editor (Medical):   Marland Kitchen Lack of Transportation (Non-Medical):   Physical Activity:   . Days of  Exercise per Week:   . Minutes of Exercise per Session:   Stress:   . Feeling of Stress :   Social Connections:   . Frequency of Communication with Friends and Family:   . Frequency of Social Gatherings with Friends and Family:   . Attends Religious Services:   . Active Member of Clubs or Organizations:   . Attends Archivist Meetings:   Marland Kitchen Marital Status:   Intimate Partner Violence:   . Fear of Current or Ex-Partner:   . Emotionally Abused:   Marland Kitchen Physically Abused:   . Sexually Abused:    Social History   Substance and Sexual Activity  Alcohol Use Yes  . Alcohol/week: 3.0 standard drinks  . Types: 3 Standard drinks or equivalent per week   Comment: 2 a week   Social History   Tobacco Use  Smoking Status Never Smoker  Smokeless Tobacco Never Used   Social History   Substance and Sexual Activity  Drug Use No    GYN: Sexual Health Menstrual status: regular menses LMP: No LMP recorded. Patient is postmenopausal. Last pap smear: see HM section History of abnormal pap smears:  Sexually active: with female partner Current contraception:   Health Maintenance: See under health Maintenance activity for review of completion dates as well. Immunization History  Administered Date(s) Administered  . Influenza Inj Mdck Quad With Preservative 05/13/2018  . Influenza,inj,Quad PF,6+ Mos 04/14/2017  . Influenza-Unspecified 04/18/2014, 05/02/2015  . Tdap 07/16/2011      Depression Screen-PHQ2/9 Depression screen [facility] 2/9 10/25/2019 04/30/2019 03/25/2018 01/02/2018 09/19/2017  Decreased Interest 0 0 0 0 0  Down, Depressed, Hopeless 0 0 0 0 0  PHQ - 2 Score 0 0 0 0 0       Depression Severity and Treatment Recommendations:  0-4= None  5-9= Mild / Treatment: Support, educate to call if worse; return in one month  10-14= Moderate / Treatment: Support, watchful waiting; Antidepressant or Psycotherapy  15-19= Moderately severe / Treatment: Antidepressant OR Psychotherapy   >= 20 = Major depression, severe / Antidepressant AND Psychotherapy    Review of Systems   ROS  See HPI for ROS as well.   Review of Systems  Constitutional: Negative for activity change, appetite change, chills and fever.  HENT: Negative for congestion, nosebleeds, trouble swallowing and voice change.   Respiratory: Negative for cough, shortness of breath and wheezing.   Gastrointestinal: Negative for diarrhea, nausea and vomiting.  Genitourinary: Negative for difficulty urinating, dysuria, flank pain and hematuria.  Musculoskeletal: Negative for back pain, joint swelling and neck pain.  Neurological: Negative for dizziness, speech difficulty, light-headedness and numbness.  See HPI. All other review of systems negative.    Objective:  Vitals:   10/25/19 1009  BP: 117/71  Pulse: 79  Temp: 98.2 F (36.8 C)  SpO2: 98%  Weight: 142 lb (64.4 kg)  Height: 5' 3.5" (1.613 m)    Body mass index is 24.76 kg/m.  Wt Readings from Last 3 Encounters:  10/25/19 142 lb (64.4 kg)  08/26/18 155 lb 3.2 oz (70.4 kg)  03/25/18 161 lb 3.2 oz (73.1 kg)    Physical Exam  BP 117/71   Pulse 79   Temp 98.2 F (36.8 C)   Ht 5' 3.5" (1.613 m)   Wt 142 lb (64.4 kg)   SpO2 98%   BMI 24.76 kg/m   General Appearance:    Alert, cooperative, no distress, appears stated age  Head:    Normocephalic, without obvious abnormality, atraumatic  Eyes:    PERRL, conjunctiva/corneas clear, EOM's intact  Ears:    Normal TM's and external ear canals, both ears  Neck:   Supple, symmetrical, trachea midline, no adenopathy;    thyroid:  no enlargement/tenderness/nodules  Back:     Symmetric, no curvature, ROM normal, no CVA tenderness  Lungs:     Clear to auscultation bilaterally, respirations unlabored  Chest Wall:    No tenderness or deformity   Heart:    Regular rate and rhythm, S1 and S2 normal, no murmur, rub   or gallop  Breast Exam:    No tenderness, masses, or nipple abnormality    Abdomen:     Soft, non-tender, bowel sounds active all four quadrants,    no masses, no organomegaly  Genitalia:    Chaperone present  Normal female without lesion, discharge or tenderness, pap smear performed, no adnexal masses, uterus midline and nontender   Extremities:   Extremities normal, atraumatic, no cyanosis or edema  Pulses:   2+ and symmetric all extremities  Skin:   Skin color, texture, turgor normal, no rashes or lesions  Lymph nodes:   Cervical, supraclavicular, and axillary nodes normal  Neurologic:   CNII-XII intact, normal strength, sensation and reflexes    throughout       Assessment/Plan:   Patient was seen for a health maintenance exam.  Counseled the patient on health maintenance issues. Reviewed her health mainteance schedule and ordered appropriate tests (see orders.) Counseled on regular exercise and weight management. Recommend regular eye exams and dental cleaning.   The following issues were addressed today for health maintenance:   Evelise was seen today for hypothyroidism.  Diagnoses and all orders for this visit:  Health maintenance examination- Women's Health Maintenance Plan Advised monthly breast exam and annual mammogram Advised dental exam every six months Discussed stress management Discussed pap smear screening guidelines    Environmental allergies -     fluticasone (FLONASE) 50 MCG/ACT nasal spray; Place 2 sprays into both nostrils daily.  Acquired hypothyroidism -     Lipid panel -     CMP14+EGFR -     TSH  Recurrent major depressive disorder, in full remission (HCC) -     PARoxetine (PAXIL) 40 MG tablet; Take 1 tablet (40 mg total) by mouth daily.  Screening breast examination -     MM DIGITAL SCREENING BILATERAL; Future  Special screening for malignant neoplasms, colon- Colon Cancer Screening Pt elected cologuard after discussion of screening options  -     Cologuard -     Cancel: Cytology - PAP([place]) -     Cytology  - PAP([place])  Family history of ovarian cancer -  Discussed  going to Gynecology to discuss screening options and genetic testing -     Cancel: Ambulatory referral to Obstetrics / Gynecology -     Ambulatory referral to Obstetrics / Gynecology    No follow-ups on file.    Body mass index is 24.76 kg/m.:  Discussed the patient's BMI with patient. The BMI body mass index is 24.76 kg/m.     No future appointments.  Patient Instructions    We recommend that you schedule a mammogram for breast cancer screening. Typically, you do not need a referral to do this. Please contact a local imaging center to schedule your mammogram.   [facility] ([place]) - [phone] or [phone]    If you have lab work done today you will be contacted with your lab results within the next 2 weeks.  If you have not heard from [place] then please contact us. The fastest way to get your results is to register for My Chart.   IF you received an x-ray today, you will receive an invoice from [facility]. Please contact [facility] at [phone] with questions or concerns regarding your invoice.   IF you received labwork today, you will receive an invoice from [facility]. Please contact [facility] at [phone] with questions or concerns regarding your invoice.   Our billing staff will not be able to assist you with questions regarding bills from these companies.  You will be contacted with the lab results as soon as they are available. The fastest way to get your results is to activate your My Chart account. Instructions are located on the last page of this paperwork. If you have not heard from [place] regarding the results in 2 weeks, please contact this office.      Ovarian Cancer  Ovarian cancer is an abnormal growth of cells that forms a mass (malignant tumor) on one or both ovaries. The ovaries are the parts of the female reproductive system that produce eggs.  Women have two ovaries. They are located on either side of the uterus. What are the causes? The cause of ovarian cancer is not known. What increases the risk? You are more likely to develop this condition if you:  Are 43 or older.  Have a personal or family history of endometrial, colon, breast, or ovarian cancer.  Have the genes associated with breast and ovarian cancer (BRCA1 and BRCA2).  Have used fertility medicines.  Started menstruating before age 61.  Started menopause when you were older than 58.  Became pregnant for the first time at age 6 or older.  Have never been pregnant.  Have had hormone replacement therapy.  Are overweight.  Have certain inherited genetic conditions that raise the risk of cancer, such as Lynch syndrome.  Have a history of polycystic ovarian syndrome.  Have tissues from the uterus growing outside of the uterus (endometriosis). What are the signs or symptoms? In the early stages, ovarian cancer often does not cause symptoms. As the cancer grows, symptoms may include:  Unexplained weight loss.  Pain, swelling, and bloating in the abdomen.  Pain and pressure in your back and pelvis.  Abnormal bleeding from the vagina.  Loss of appetite.  Passing urine often.  Pain during sex.  Fatigue. How is this diagnosed? This condition may be diagnosed based on:  Your medical history and a physical exam.  A pelvic and abdominal exam to check your ovaries, uterus, vulva, cervix, vagina, bladder, rectum, and fallopian tubes.  Tests, such as: ?  An imaging test that uses sound waves to take pictures of your uterus, bladder, ovaries, and fallopian tubes (transvaginal ultrasound). For this test, a sound wave probe is inserted into your vagina. ? CT scan, PET scan, or MRI. ? X-rays of the colon and rectum. ? Blood tests. ? Taking a small piece of tissue to examine it under a microscope (biopsy). ? Removing built-up fluid from the abdomen  (ascites) to be examined under a microscope (paracentesis). Your cancer will be assessed (staged) based on how severe it is and how much it has spread. How is this treated? This condition may be treated with one or more of the following:  Surgery to remove one ovary and its fallopian tube (oophorectomy). This may be done to treat cancer in its early stages.  Surgery to remove the uterus, cervix, fallopian tubes, and ovaries (hysterectomy with bilateral salpingo-oophorectomy). Lymph tissue (lymph nodes) near the tumor and some tissue and fluid from the abdomen may also be removed and analyzed for cancer cells. This is done to treat advanced cancer.  Chemotherapy. This uses medicines to kill the cancer cells. Chemotherapy may be used before or after surgery.  Intraperitoneal chemotherapy (IP chemotherapy). Chemotherapy is given directly into the abdomen (peritoneum) through a special tube.  Targeted therapy. This uses drugs to attack specific areas within cancer cells to kill the cells or stop them from growing. Follow these instructions at home: Lifestyle  Do not use any products that contain nicotine or tobacco, such as cigarettes and e-cigarettes. If you need help quitting, ask your health care provider.  Do moderate exercise regularly, as told by your health care provider.  Try to eat regular, healthy meals. Some of your treatments might affect your appetite. If you are having problems eating or with your appetite, ask to meet with a food and nutrition specialist (dietitian).  Consider joining a support group with others who have cancer. A support group may help you with resources and information to help you cope with your cancer. General instructions  Take over-the-counter and prescription medicines only as told by your health care provider.  Do not drive or use heavy machinery while taking prescription pain medicine.  If you are taking prescription pain medicine, take actions to  prevent or treat constipation. Your health care provider may recommend that you: ? Drink enough fluid to keep your urine pale yellow. ? Eat foods that are high in fiber, such as fresh fruits and vegetables, whole grains, and beans. ? Limit foods that are high in fat and processed sugars, such as fried or sweet foods. ? Take an over-the-counter or prescription medicine for constipation.  You may need to have regular blood tests and imaging tests to monitor your response to treatment.  Keep all follow-up visits as told by your health care provider. This is important. Where to find more information  American Cancer Society: www.cancer.[place]: www.cancer.gov Contact a health care provider if you:  Are not able to follow a prescribed treatment plan or take a medicine.  Have any symptoms or changes that concern you.  Have changes in your bowel or bladder habits. Get help right away if you have:  A fever or chills.  Serious side effects or an allergic reaction to a treatment or medicine.  Back pain that is new.  Increased pain, swelling, or bloating in your abdomen. Summary  Ovarian cancer is an abnormal growth of cells that forms a mass (malignant tumor) on one or both ovaries.  Symptoms of ovarian cancer may include pain in the abdomen, back, or pelvis. You may also have a loss of appetite and swelling or bloating in the abdomen.  You are more likely to develop ovarian cancer if you are older than 47, but it can affect younger women, particularly those with the BRCA1 or BRCA2 genes. These genes are associated with breast and ovarian cancer.  Treatment may include a combination of surgery and medicines that kill cancer cells (chemotherapy).  You may need to have regular blood tests and imaging tests to monitor your response to treatment. This information is not intended to replace advice given to you by your health care provider. Make sure you discuss any  questions you have with your health care provider. Document Revised: 10/22/2018 Document Reviewed: 07/23/2017 Elsevier Patient Education  [place].

## 2019-10-26 ENCOUNTER — Other Ambulatory Visit: Payer: Self-pay | Admitting: Family Medicine

## 2019-10-26 DIAGNOSIS — E039 Hypothyroidism, unspecified: Secondary | ICD-10-CM

## 2019-10-26 LAB — CMP14+EGFR
ALT: 18 IU/L (ref 0–32)
AST: 20 IU/L (ref 0–40)
Albumin/Globulin Ratio: 2.1 (ref 1.2–2.2)
Albumin: 4.2 g/dL (ref 3.8–4.8)
Alkaline Phosphatase: 74 IU/L (ref 39–117)
BUN/Creatinine Ratio: 14 (ref 12–28)
BUN: 9 mg/dL (ref 8–27)
Bilirubin Total: 0.4 mg/dL (ref 0.0–1.2)
CO2: 23 mmol/L (ref 20–29)
Calcium: 8.9 mg/dL (ref 8.7–10.3)
Chloride: 102 mmol/L (ref 96–106)
Creatinine, Ser: 0.65 mg/dL (ref 0.57–1.00)
GFR calc Af Amer: 109 mL/min/{1.73_m2} (ref >59)
GFR calc non Af Amer: 95 mL/min/{1.73_m2} (ref >59)
Globulin, Total: 2 g/dL (ref 1.5–4.5)
Glucose: 70 mg/dL (ref 65–99)
Potassium: 4.1 mmol/L (ref 3.5–5.2)
Sodium: 139 mmol/L (ref 134–144)
Total Protein: 6.2 g/dL (ref 6.0–8.5)

## 2019-10-26 LAB — LIPID PANEL
Chol/HDL Ratio: 2.6 ratio (ref 0.0–4.4)
Cholesterol, Total: 225 mg/dL — ABNORMAL HIGH (ref 100–199)
HDL: 86 mg/dL (ref >39)
LDL Chol Calc (NIH): 127 mg/dL — ABNORMAL HIGH (ref 0–99)
Triglycerides: 72 mg/dL (ref 0–149)
VLDL Cholesterol Cal: 12 mg/dL (ref 5–40)

## 2019-10-26 LAB — TSH: TSH: 2.19 u[IU]/mL (ref 0.450–4.500)

## 2019-10-26 MED ORDER — LEVOTHYROXINE SODIUM 88 MCG PO TABS: 88.0000 ug | ORAL_TABLET | Freq: Every day | ORAL | 3 refills | Status: DC

## 2019-10-27 LAB — CYTOLOGY - PAP
Comment: NEGATIVE
Diagnosis: NEGATIVE
High risk HPV: NEGATIVE

## 2019-11-02 ENCOUNTER — Ambulatory Visit
Admission: RE | Admit: 2019-11-02 | Discharge: 2019-11-02 | Disposition: A | Discharge status: Home / Self Care | Payer: BLUE CROSS/BLUE SHIELD | Source: Ambulatory Visit | Attending: Family Medicine | Admitting: Family Medicine

## 2019-11-02 ENCOUNTER — Encounter: Payer: Self-pay | Admitting: Family Medicine

## 2019-11-02 ENCOUNTER — Other Ambulatory Visit: Payer: Self-pay

## 2019-11-02 DIAGNOSIS — Z1239 Encounter for other screening for malignant neoplasm of breast: Secondary | ICD-10-CM

## 2019-12-03 ENCOUNTER — Encounter: Payer: BLUE CROSS/BLUE SHIELD | Admitting: Obstetrics and Gynecology

## 2020-04-25 ENCOUNTER — Encounter: Payer: 59 | Admitting: Family Medicine

## 2020-05-02 ENCOUNTER — Other Ambulatory Visit: Payer: Self-pay

## 2020-05-02 ENCOUNTER — Encounter: Payer: Self-pay | Admitting: Family Medicine

## 2020-05-02 ENCOUNTER — Ambulatory Visit (INDEPENDENT_AMBULATORY_CARE_PROVIDER_SITE_OTHER): Payer: 59 | Admitting: Family Medicine

## 2020-05-02 VITALS — BP 119/69 | HR 81 | Temp 97.6°F | Ht 63.5 in | Wt 144.0 lb

## 2020-05-02 DIAGNOSIS — Z9109 Other allergy status, other than to drugs and biological substances: Secondary | ICD-10-CM | POA: Diagnosis not present

## 2020-05-02 DIAGNOSIS — F3342 Major depressive disorder, recurrent, in full remission: Secondary | ICD-10-CM | POA: Diagnosis not present

## 2020-05-02 DIAGNOSIS — G43809 Other migraine, not intractable, without status migrainosus: Secondary | ICD-10-CM

## 2020-05-02 DIAGNOSIS — E039 Hypothyroidism, unspecified: Secondary | ICD-10-CM

## 2020-05-02 DIAGNOSIS — Z13220 Encounter for screening for lipoid disorders: Secondary | ICD-10-CM

## 2020-05-02 MED ORDER — FLUTICASONE PROPIONATE 50 MCG/ACT NA SUSP: 2.0000 | Freq: Every day | NASAL | 3 refills | Status: DC

## 2020-05-02 MED ORDER — PAROXETINE HCL 40 MG PO TABS: 40.0000 mg | ORAL_TABLET | Freq: Every day | ORAL | 3 refills | Status: DC

## 2020-05-02 MED ORDER — ALMOTRIPTAN MALATE 6.25 MG PO TABS: 6.2500 mg | ORAL_TABLET | ORAL | 2 refills | Status: DC | PRN

## 2020-05-02 MED ORDER — LEVOTHYROXINE SODIUM 88 MCG PO TABS: 88.0000 ug | ORAL_TABLET | Freq: Every day | ORAL | 3 refills | Status: DC

## 2020-05-02 NOTE — Patient Instructions (Addendum)
Please send in cologuard, call if your kit is expired. Medication Refills sent. Set up a my chart    RE: MyChart  Dear Sheri Adkins  MyChart makes it easy for you to view your health information - all in one secure location - from any computer or mobile device at any time. Use the activation code below to enroll in MyChart online at [email or web address]   Once your account is activated, you can:  Marland Kitchen View your test results. . Communicate securely with your physician's office.  . View your medical history, allergies, medications, and immunizations. . Receive care virtually through an e-Visit.   If you are over 18, you may use features of MyChart to manage the health information of your spouse, children or others you care for.  Download child and adult access forms at [email or web address].    As you activate your MyChart account and need any technical assistance, please call the MyChart technical support line at [phone]).  Be sure to also download the MyChart app for your mobile device.   Thank you for choosing [facility] for your family's health care needs!   MyChart Activation Code:  [email or web address] Expires: 06/16/2020  8:46 AM             Sheri Adkins  Sheri Adkins, Sheri Adkins [id number]  Stool DNA Testing for Colon Cancer Why am I having this test? Colon cancer is the second-leading cause of cancer deaths in men and women. Testing for colon cancer before symptoms develop (screening) reduces your risk for this cancer. Stool DNA testing, also called the FIT-DNA test, is one method of screening for colon cancer. Colon cancer grows slowly, so finding the cancer early means a better chance for effective treatment. All adults should have colon cancer screening starting at age 29 and continuing until age 3. Your health care provider may recommend screening at age 65. Screening may include having stool DNA testing every 3 years  if:  You have no symptoms of colon cancer. Symptoms include rectal bleeding, weight loss, and changes in bowel habits.  You have an average risk of colon cancer. Average risk means: ? You do not have precancerous polyps. ? You do not have family or personal history of either colon cancer or a colon disease that increases your risk for colon cancer. What is being tested? For the test, a sample of your stool (feces) is checked for blood and changes in DNA that could lead to cancer. Growths in your colon that are cancerous (malignant) or may become cancerous (precancerous polyps) bleed and shed cells. Blood and cells can be picked up by stool as it passes through your colon. This test checks for blood cells as well as nine types of DNA (biomarkers) in three genes that have been linked to colon cancer and precancerous polyps. What kind of sample is taken?  This test uses a stool sample that is collected when you have a bowel movement. How do I collect samples at home? You will be sent a stool sample collection kit in the mail. It will include instructions and everything you need to get the sample. Instructions may include these steps:  Store the kit in a dry place at room temperature until you are ready to collect the sample. Keep the kit away from heat and sunlight.  To collect the sample, place the collection device over the toilet.  Collect the sample according to the instructions that came with your kit.  After collecting a  stool sample, follow instructions carefully regarding mixing the stool with a preservative and sealing the sample.  Send the sample to the lab in the postage-paid box that was included in the kit. Your stool sample will be checked within 3 days. The results will be sent to your health care provider. How do I prepare for this test? There is no preparation required for this test. However, do not collect a stool sample if:  You have bleeding hemorrhoids.  You have any  rectal bleeding.  You have a cut on your hand or finger.  You have your menstrual period.  You have diarrhea. How are the results reported? Your test results will be reported as either positive or negative. It is up to you to get your test results. Ask your health care provider, or the department that is doing the test, when your results will be ready. What do the results mean?  A positive result means that the test found abnormal DNA, blood cells, or both. If you have a positive result, you will need to have a follow-up exam of your colon done with a scope (colonoscopy).  A negative result means that no blood or changes in DNA were found. This does not guarantee that you do not have colon cancer. Your health care provider may recommend that you have other screening tests. Talk with your health care provider to discuss your results, treatment options, and if necessary, the need for more tests. Talk with your health care provider if you have any questions about your results. Questions to ask [your / your child's] health care provider Ask [your / your child's] health care provider, or the department that is doing the test: This information is not intended to replace advice given to you by your health care provider. Make sure you discuss any questions you have with your health care provider. Document Revised: 10/22/2018 Document Reviewed: 03/21/2016 Elsevier Patient Education  [place] Maintenance for Postmenopausal Women Menopause is a normal process in which your ability to get pregnant comes to an end. This process happens slowly over many months or years, usually between the ages of 12 and 12. Menopause is complete when you have missed your menstrual periods for 12 months. It is important to talk with your health care provider about some of the most common conditions that affect women after menopause (postmenopausal women). These include heart disease, cancer, and bone loss  (osteoporosis). Adopting a healthy lifestyle and getting preventive care can help to promote your health and wellness. The actions you take can also lower your chances of developing some of these common conditions. What should I know about menopause? During menopause, you may get a number of symptoms, such as:  Hot flashes. These can be moderate or severe.  Night sweats.  Decrease in sex drive.  Mood swings.  Headaches.  Tiredness.  Irritability.  Memory problems.  Insomnia. Choosing to treat or not to treat these symptoms is a decision that you make with your health care provider. Do I need hormone replacement therapy?  Hormone replacement therapy is effective in treating symptoms that are caused by menopause, such as hot flashes and night sweats.  Hormone replacement carries certain risks, especially as you become older. If you are thinking about using estrogen or estrogen with progestin, discuss the benefits and risks with your health care provider. What is my risk for heart disease and stroke? The risk of heart disease, heart attack, and stroke increases as you age.  One of the causes may be a change in the body's hormones during menopause. This can affect how your body uses dietary fats, triglycerides, and cholesterol. Heart attack and stroke are medical emergencies. There are many things that you can do to help prevent heart disease and stroke. Watch your blood pressure  High blood pressure causes heart disease and increases the risk of stroke. This is more likely to develop in people who have high blood pressure readings, are of African descent, or are overweight.  Have your blood pressure checked: ? Every 3-5 years if you are 65-66 years of age. ? Every year if you are 68 years old or older. Eat a healthy diet   Eat a diet that includes plenty of vegetables, fruits, low-fat dairy products, and lean protein.  Do not eat a lot of foods that are high in solid fats, added  sugars, or sodium. Get regular exercise Get regular exercise. This is one of the most important things you can do for your health. Most adults should:  Try to exercise for at least 150 minutes each week. The exercise should increase your heart rate and make you sweat (moderate-intensity exercise).  Try to do strengthening exercises at least twice each week. Do these in addition to the moderate-intensity exercise.  Spend less time sitting. Even light physical activity can be beneficial. Other tips  Work with your health care provider to achieve or maintain a healthy weight.  Do not use any products that contain nicotine or tobacco, such as cigarettes, e-cigarettes, and chewing tobacco. If you need help quitting, ask your health care provider.  Know your numbers. Ask your health care provider to check your cholesterol and your blood sugar (glucose). Continue to have your blood tested as directed by your health care provider. Do I need screening for cancer? Depending on your health history and family history, you may need to have cancer screening at different stages of your life. This may include screening for:  Breast cancer.  Cervical cancer.  Lung cancer.  Colorectal cancer. What is my risk for osteoporosis? After menopause, you may be at increased risk for osteoporosis. Osteoporosis is a condition in which bone destruction happens more quickly than new bone creation. To help prevent osteoporosis or the bone fractures that can happen because of osteoporosis, you may take the following actions:  If you are 15-58 years old, get at least 1,000 mg of calcium and at least 600 mg of vitamin D per day.  If you are older than age 65 but younger than age 41, get at least 1,200 mg of calcium and at least 600 mg of vitamin D per day.  If you are older than age 8, get at least 1,200 mg of calcium and at least 800 mg of vitamin D per day. Smoking and drinking excessive alcohol increase the risk  of osteoporosis. Eat foods that are rich in calcium and vitamin D, and do weight-bearing exercises several times each week as directed by your health care provider. How does menopause affect my mental health? Depression may occur at any age, but it is more common as you become older. Common symptoms of depression include:  Low or sad mood.  Changes in sleep patterns.  Changes in appetite or eating patterns.  Feeling an overall lack of motivation or enjoyment of activities that you previously enjoyed.  Frequent crying spells. Talk with your health care provider if you think that you are experiencing depression. General instructions See your health care  provider for regular wellness exams and vaccines. This may include:  Scheduling regular health, dental, and eye exams.  Getting and maintaining your vaccines. These include: ? Influenza vaccine. Get this vaccine each year before the flu season begins. ? Pneumonia vaccine. ? Shingles vaccine. ? Tetanus, diphtheria, and pertussis (Tdap) booster vaccine. Your health care provider may also recommend other immunizations. Tell your health care provider if you have ever been abused or do not feel safe at home. Summary  Menopause is a normal process in which your ability to get pregnant comes to an end.  This condition causes hot flashes, night sweats, decreased interest in sex, mood swings, headaches, or lack of sleep.  Treatment for this condition may include hormone replacement therapy.  Take actions to keep yourself healthy, including exercising regularly, eating a healthy diet, watching your weight, and checking your blood pressure and blood sugar levels.  Get screened for cancer and depression. Make sure that you are up to date with all your vaccines. This information is not intended to replace advice given to you by your health care provider. Make sure you discuss any questions you have with your health care provider. Document  Revised: 06/24/2018 Document Reviewed: 06/24/2018 Elsevier Patient Education  [employer].   If you have lab work done today you will be contacted with your lab results within the next 2 weeks.  If you have not heard from [place] then please contact us. The fastest way to get your results is to register for My Chart.   IF you received an x-ray today, you will receive an invoice from [facility]. Please contact [facility] at [phone] with questions or concerns regarding your invoice.   IF you received labwork today, you will receive an invoice from [facility]. Please contact [facility] at [phone] with questions or concerns regarding your invoice.   Our billing staff will not be able to assist you with questions regarding bills from these companies.  You will be contacted with the lab results as soon as they are available. The fastest way to get your results is to activate your My Chart account. Instructions are located on the last page of this paperwork. If you have not heard from [place] regarding the results in 2 weeks, please contact this office.

## 2020-05-02 NOTE — Progress Notes (Signed)
10/19/20218:56 AM  Sheri Adkins [birth year], 64 y.o., female [id number]  Chief Complaint  Patient presents with  . Transitions Of Care  . Hypothyroidism    med refills 90 day supplies  . Headache    med refills     HPI:   Patient is a 64 y.o. female with past medical history significant for hypothyroid, depression, and migraines who presents today for transfer of care.  Wt Readings from Last 3 Encounters:  05/02/20 144 lb (65.3 kg)  10/25/19 142 lb (64.4 kg)  08/26/18 155 lb 3.2 oz (70.4 kg)   Overall doing really well Working on losing weight by improving diet and increasing daily walks Work going well transferred to closer office, better commute Migraines improving  At the worst 2-4 a month and large one every 2 months Lives with brother and sister Working on improving family relationships Frequent congestion: uses flonase Increasing water intake, which also improves constipation  Flu: October done Covid: Spring done  Health Habits: Dental Exam: up to date Eye Exam: up to date Exercise: 4 times/week on average Current exercise activities: walking Diet: balanced Colon Cancer screen: Has at home: will send LMP: postmenopausal. Last pap smear: 10/25/19 Mammogram: 11/02/19 :yearly DEXA: Last 06/12/2017 every 5 years All patients should ensure an adequate intake of dietary calcium (1200 mg/d) and vitamin D (800 IU daily) unless contraindicated.  FOLLOW-UP: People with diagnosed cases of osteoporosis or at high risk for fracture should have regular bone mineral density tests. For patients eligible for Medicare, routine testing is allowed once every 2 years. The testing frequency can be increased to one year for patients who have rapidly progressing disease, those who are receiving or discontinuing medical therapy to restore bone mass, or have additional risk factors.   Depression screen [facility] 2/9 05/02/2020 10/25/2019 04/30/2019  Decreased Interest 0 0 0   Down, Depressed, Hopeless 0 0 0  PHQ - 2 Score 0 0 0    Fall Risk  05/02/2020 10/25/2019 04/30/2019 08/26/2018 03/25/2018  Falls in the past year? 0 0 0 1 No  Number falls in past yr: 0 0 0 0 -  Injury with Fall? 0 0 0 1 -  Comment - - - - -  Follow up Falls evaluation completed Falls evaluation completed - Falls evaluation completed -     Allergies  Allergen Reactions  . Penicillins     Has patient had a PCN reaction causing immediate rash, facial/tongue/throat swelling, SOB or lightheadedness with hypotension: Yes Has patient had a PCN reaction causing severe rash involving mucus membranes or skin necrosis: No Has patient had a PCN reaction that required hospitalization No Has patient had a PCN reaction occurring within the last 10 years: No If all of the above answers are "NO", then may proceed with Cephalosporin use.   . Sulfa Antibiotics     Rash    Prior to Admission medications   Medication Sig Start Date End Date Taking? Authorizing Provider  almotriptan (AXERT) 6.25 MG tablet Take 1 tablet (6.25 mg total) by mouth as needed for migraine. 08/26/18  Yes Sagardia, Ines Bloomer, MD  aspirin-acetaminophen-caffeine [facility] MIGRAINE) [phone] MG tablet Take by mouth every 6 (six) hours as needed for headache.   Yes [provider]  fluticasone (FLONASE) 50 MCG/ACT nasal spray Place 2 sprays into both nostrils daily. 10/25/19  Yes Forrest Moron, MD  levothyroxine (SYNTHROID) 88 MCG tablet Take 1 tablet (88 mcg total) by mouth daily. 10/26/19  Yes Stallings, Zoe A,  MD  PARoxetine (PAXIL) 40 MG tablet Take 1 tablet (40 mg total) by mouth daily. 10/25/19  Yes Forrest Moron, MD    Past Medical History:  Diagnosis Date  . Allergy   . Arthritis   . Depression   . Migraine   . Thyroid disease     No past surgical history on file.  Social History   Tobacco Use  . Smoking status: Never Smoker  . Smokeless tobacco: Never Used  Substance Use Topics  . Alcohol  use: Yes    Alcohol/week: 3.0 standard drinks    Types: 3 Standard drinks or equivalent per week    Comment: 2 a week    Family History  Problem Relation Age of Onset  . Hypertension Mother   . Stroke Mother   . Mental illness Mother   . Cancer Sister        ovarian cancer  . Mental illness Sister   . Heart disease Paternal Grandfather   . Mental illness Brother   . Cancer Maternal Grandmother   . Mental illness Maternal Grandmother   . Mental illness Brother   . Mental illness Sister     Review of Systems  Constitutional: Negative for malaise/fatigue.  Eyes: Negative for blurred vision and double vision.  Respiratory: Negative for cough, shortness of breath and wheezing.   Cardiovascular: Negative for chest pain, palpitations and leg swelling.  Gastrointestinal: Negative for abdominal pain, blood in stool, constipation, diarrhea, heartburn, nausea and vomiting.  Genitourinary: Negative for dysuria, frequency and hematuria.  Musculoskeletal: Negative for back pain and joint pain.  Skin: Negative for rash.  Neurological: Negative for dizziness, tingling, weakness and headaches.  Endo/Heme/Allergies: Does not bruise/bleed easily.  Psychiatric/Behavioral: Negative for depression and suicidal ideas. The patient is not nervous/anxious.      OBJECTIVE:  Today's Vitals   05/02/20 0816  BP: 119/69  Pulse: 81  Temp: 97.6 F (36.4 C)  SpO2: 97%  Weight: 144 lb (65.3 kg)  Height: 5' 3.5" (1.613 m)   Body mass index is 25.11 kg/m.   Physical Exam Constitutional:      General: She is not in acute distress.    Appearance: Normal appearance. She is not ill-appearing.  HENT:     Head: Normocephalic and atraumatic.     Nose: Nose normal. No congestion or rhinorrhea.  Cardiovascular:     Rate and Rhythm: Normal rate and regular rhythm.     Pulses: Normal pulses.     Heart sounds: Normal heart sounds. No murmur heard.  No friction rub. No gallop.   Pulmonary:      Effort: Pulmonary effort is normal. No respiratory distress.     Breath sounds: Normal breath sounds. No stridor. No wheezing, rhonchi or rales.  Abdominal:     General: Bowel sounds are normal. There is no distension.     Palpations: Abdomen is soft.     Tenderness: There is no abdominal tenderness.  Musculoskeletal:     Right lower leg: No edema.     Left lower leg: No edema.  Skin:    General: Skin is warm and dry.  Neurological:     Mental Status: She is alert and oriented to person, place, and time.  Psychiatric:        Mood and Affect: Mood normal.        Behavior: Behavior normal.     No results found for this or any previous visit (from the past 24 hour(s)).  No results  found.   ASSESSMENT and PLAN  Problem List Items Addressed This Visit      Cardiovascular and Mediastinum   Migraine variants, not intractable   Relevant Medications   PARoxetine (PAXIL) 40 MG tablet   almotriptan (AXERT) 6.25 MG tablet Stable on current regimen only needed 2-3 times per month Increasing water intake and decreasing sugar     Endocrine   Hypothyroidism   Relevant Medications   levothyroxine (SYNTHROID) 88 MCG tablet Will follow up with lab results. Lab Results  Component Value Date   TSH 2.190 10/25/2019     Other Relevant Orders   TSH     Other   Depression   Relevant Medications   PARoxetine (PAXIL) 40 MG tablet Stable on current regimen. PHQ2:0    Other Visit Diagnoses    Screening, lipid    -  Primary   Relevant Orders   Lipid Panel: Will follow up with results ASCVD Risk: 3.3%: Continue to work on lifestyle modifications Lab Results  Component Value Date   CHOL 225 (H) 10/25/2019   HDL 86 10/25/2019   LDLCALC 127 (H) 10/25/2019   LDLDIRECT 151 (H) 11/02/2013   TRIG 72 10/25/2019   CHOLHDL 2.6 10/25/2019      Environmental allergies       Relevant Medications   fluticasone (FLONASE) 50 MCG/ACT nasal spray Stable on current regimen.    Please send in  cologuard, call if your kit is expired. Set up a my chart when able, instructions provided   Return in about 6 months (around 10/31/2020).    Huston Foley Sheri Aydt, FNP-BC [facility] [place], [place] [address] Ph.  [phone] Fax [phone]

## 2020-05-03 ENCOUNTER — Other Ambulatory Visit: Payer: Self-pay | Admitting: Family Medicine

## 2020-05-03 DIAGNOSIS — E039 Hypothyroidism, unspecified: Secondary | ICD-10-CM

## 2020-05-03 LAB — LIPID PANEL
Chol/HDL Ratio: 2.8 ratio (ref 0.0–4.4)
Cholesterol, Total: 231 mg/dL — ABNORMAL HIGH (ref 100–199)
HDL: 84 mg/dL (ref >39)
LDL Chol Calc (NIH): 132 mg/dL — ABNORMAL HIGH (ref 0–99)
Triglycerides: 89 mg/dL (ref 0–149)
VLDL Cholesterol Cal: 15 mg/dL (ref 5–40)

## 2020-05-03 LAB — TSH: TSH: 5.54 u[IU]/mL — ABNORMAL HIGH (ref 0.450–4.500)

## 2020-05-03 MED ORDER — LEVOTHYROXINE SODIUM 100 MCG PO TABS: 100.0000 ug | ORAL_TABLET | Freq: Every day | ORAL | 3 refills | Status: DC

## 2020-05-03 NOTE — Progress Notes (Signed)
Hello, If we can call Sheri Adkins and let her know her TSH was elevated to 5.5. I have placed an order to increase her levothyroxine to 1103mcq per day and sent it to her pharmacy. If she could start taking that and return in 6 weeks to recheck it would be great. Cholesterol levels remain elevated, continue to work on diet and exercise.  Thanks!

## 2020-09-18 ENCOUNTER — Other Ambulatory Visit: Payer: Self-pay

## 2020-09-18 ENCOUNTER — Ambulatory Visit (INDEPENDENT_AMBULATORY_CARE_PROVIDER_SITE_OTHER): Payer: 59 | Admitting: Family Medicine

## 2020-09-18 ENCOUNTER — Encounter: Payer: Self-pay | Admitting: Family Medicine

## 2020-09-18 VITALS — BP 105/66 | HR 83 | Temp 97.6°F | Ht 63.5 in | Wt 148.0 lb

## 2020-09-18 DIAGNOSIS — Z9109 Other allergy status, other than to drugs and biological substances: Secondary | ICD-10-CM | POA: Diagnosis not present

## 2020-09-18 DIAGNOSIS — E039 Hypothyroidism, unspecified: Secondary | ICD-10-CM

## 2020-09-18 DIAGNOSIS — G43809 Other migraine, not intractable, without status migrainosus: Secondary | ICD-10-CM

## 2020-09-18 DIAGNOSIS — E7800 Pure hypercholesterolemia, unspecified: Secondary | ICD-10-CM

## 2020-09-18 MED ORDER — OXYMETAZOLINE HCL 0.05 % NA SOLN: 1.0000 | Freq: Two times a day (BID) | NASAL | 0 refills | Status: DC

## 2020-09-18 MED ORDER — CETIRIZINE HCL 10 MG PO TABS: 10.0000 mg | ORAL_TABLET | Freq: Every day | ORAL | 11 refills | Status: DC

## 2020-09-18 NOTE — Patient Instructions (Addendum)
  Health Maintenance, Female Adopting a healthy lifestyle and getting preventive care are important in promoting health and wellness. Ask your health care provider about:  The right schedule for you to have regular tests and exams.  Things you can do on your own to prevent diseases and keep yourself healthy. What should I know about diet, weight, and exercise? Eat a healthy diet  Eat a diet that includes plenty of vegetables, fruits, low-fat dairy products, and lean protein.  Do not eat a lot of foods that are high in solid fats, added sugars, or sodium.   Maintain a healthy weight Body mass index (BMI) is used to identify weight problems. It estimates body fat based on height and weight. Your health care provider can help determine your BMI and help you achieve or maintain a healthy weight. Get regular exercise Get regular exercise. This is one of the most important things you can do for your health. Most adults should:  Exercise for at least 150 minutes each week. The exercise should increase your heart rate and make you sweat (moderate-intensity exercise).  Do strengthening exercises at least twice a week. This is in addition to the moderate-intensity exercise.  Spend less time sitting. Even light physical activity can be beneficial. Watch cholesterol and blood lipids Have your blood tested for lipids and cholesterol at 64 years of age, then have this test every 5 years. Have your cholesterol levels checked more often if:  Your lipid or cholesterol levels are high.  You are older than 64 years of age.  You are at high risk for heart disease. What should I know about cancer screening? Depending on your health history and family history, you may need to have cancer screening at various ages. This may include screening for:  Breast cancer.  Cervical cancer.  Colorectal cancer.  Skin cancer.  Lung cancer. What should I know about heart disease, diabetes, and high blood  pressure? Blood pressure and heart disease  High blood pressure causes heart disease and increases the risk of stroke. This is more likely to develop in people who have high blood pressure readings, are of African descent, or are overweight.  Have your blood pressure checked: ? Every 3-5 years if you are 18-39 years of age. ? Every year if you are 40 years old or older. Diabetes Have regular diabetes screenings. This checks your fasting blood sugar level. Have the screening done:  Once every three years after age 40 if you are at a normal weight and have a low risk for diabetes.  More often and at a younger age if you are overweight or have a high risk for diabetes. What should I know about preventing infection? Hepatitis B If you have a higher risk for hepatitis B, you should be screened for this virus. Talk with your health care provider to find out if you are at risk for hepatitis B infection. Hepatitis C Testing is recommended for:  Everyone born from 1945 through 1965.  Anyone with known risk factors for hepatitis C. Sexually transmitted infections (STIs)  Get screened for STIs, including gonorrhea and chlamydia, if: ? You are sexually active and are younger than 64 years of age. ? You are older than 64 years of age and your health care provider tells you that you are at risk for this type of infection. ? Your sexual activity has changed since you were last screened, and you are at increased risk for chlamydia or gonorrhea. Ask your health   care provider if you are at risk.  Ask your health care provider about whether you are at high risk for HIV. Your health care provider may recommend a prescription medicine to help prevent HIV infection. If you choose to take medicine to prevent HIV, you should first get tested for HIV. You should then be tested every 3 months for as long as you are taking the medicine. Pregnancy  If you are about to stop having your period (premenopausal) and  you may become pregnant, seek counseling before you get pregnant.  Take 400 to 800 micrograms (mcg) of folic acid every day if you become pregnant.  Ask for birth control (contraception) if you want to prevent pregnancy. Osteoporosis and menopause Osteoporosis is a disease in which the bones lose minerals and strength with aging. This can result in bone fractures. If you are 65 years old or older, or if you are at risk for osteoporosis and fractures, ask your health care provider if you should:  Be screened for bone loss.  Take a calcium or vitamin D supplement to lower your risk of fractures.  Be given hormone replacement therapy (HRT) to treat symptoms of menopause. Follow these instructions at home: Lifestyle  Do not use any products that contain nicotine or tobacco, such as cigarettes, e-cigarettes, and chewing tobacco. If you need help quitting, ask your health care provider.  Do not use street drugs.  Do not share needles.  Ask your health care provider for help if you need support or information about quitting drugs. Alcohol use  Do not drink alcohol if: ? Your health care provider tells you not to drink. ? You are pregnant, may be pregnant, or are planning to become pregnant.  If you drink alcohol: ? Limit how much you use to 0-1 drink a day. ? Limit intake if you are breastfeeding.  Be aware of how much alcohol is in your drink. In the [place] one drink equals one 12 oz bottle of beer (355 mL), one 5 oz glass of wine (148 mL), or one 1 oz glass of hard liquor (44 mL). General instructions  Schedule regular health, dental, and eye exams.  Stay current with your vaccines.  Tell your health care provider if: ? You often feel depressed. ? You have ever been abused or do not feel safe at home. Summary  Adopting a healthy lifestyle and getting preventive care are important in promoting health and wellness.  Follow your health care provider's instructions about healthy  diet, exercising, and getting tested or screened for diseases.  Follow your health care provider's instructions on monitoring your cholesterol and blood pressure. This information is not intended to replace advice given to you by your health care provider. Make sure you discuss any questions you have with your health care provider. Document Revised: 06/24/2018 Document Reviewed: 06/24/2018 Elsevier Patient Education  2021 [employer].   If you have lab work done today you will be contacted with your lab results within the next 2 weeks.  If you have not heard from us then please contact us. The fastest way to get your results is to register for My Chart.   IF you received an x-ray today, you will receive an invoice from [facility]. Please contact [facility] at [phone] with questions or concerns regarding your invoice.   IF you received labwork today, you will receive an invoice from [facility]. Please contact [facility] at [phone] with questions or concerns regarding your invoice.   Our billing   staff will not be able to assist you with questions regarding bills from these companies.  You will be contacted with the lab results as soon as they are available. The fastest way to get your results is to activate your My Chart account. Instructions are located on the last page of this paperwork. If you have not heard from us regarding the results in 2 weeks, please contact this office.      

## 2020-09-18 NOTE — Progress Notes (Signed)
3/7/20228:28 AM  Sheri Adkins 05-May-1956, 65 y.o., female [id number]  Chief Complaint  Patient presents with  . Allergies    Uses flonase is not helping   . Hypothyroidism  . Hyperlipidemia    HPI:   Patient is a 65 y.o. 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   Hypothyroid Levothyroxine 100 mcg Lab Results  Component Value Date   TSH 5.540 (H) 05/02/2020   Hyperlipidemia  Lab Results  Component Value Date   CHOL 231 (H) 05/02/2020   HDL 84 05/02/2020   [place] 132 (H) 05/02/2020   LDLDIRECT 151 (H) 11/02/2013   TRIG 89 05/02/2020   CHOLHDL 2.8 05/02/2020   The 10-year ASCVD risk score Mikey Bussing DC Jr., et al., 2013) is: 3.1%   Values used to calculate the score:     Age: 79 years     Sex: Female     Is Non-Hispanic African American: No     Diabetic: No     Tobacco smoker: No     Systolic Blood Pressure: 435 mmHg     Is BP treated: No     HDL Cholesterol: 84 mg/dL     Total Cholesterol: 231 mg/dL   Health Habits: Dental Exam: up to date Eye Exam: up to date Exercise: 4 times/week on average Current exercise activities: walking Diet: balanced Colon Cancer screen: Has at home: will send LMP: postmenopausal. Last pap smear: 10/25/19 Mammogram: 11/02/19 :yearly DEXA: Last 06/12/2017 every 5 years All patients should ensure an adequate intake of dietary calcium (1200 mg/d) and vitamin D (800 IU daily) unless contraindicated.  Health Maintenance  Topic Date Due  . COLONOSCOPY (Pts 45-41yr Insurance coverage will need to be confirmed)  Never done  . COVID-19 Vaccine (3 - Booster for Pfizer series)  04/13/2020  . TETANUS/TDAP  07/15/2021  . MAMMOGRAM  11/01/2021  . PAP SMEAR-Modifier  10/25/2022  . INFLUENZA VACCINE  Completed  . Hepatitis C Screening  Completed  . HIV Screening  Completed  . HPV VACCINES  Aged Out      [facility] 05/02/2020 10/25/2019 04/30/2019  Decreased Interest 0 0 0  Down, Depressed, Hopeless 0 0 0  PHQ - 2 Score 0 0 0    Fall Risk  05/02/2020 10/25/2019 04/30/2019 08/26/2018 03/25/2018  Falls in the past year? 0 0 0 1 No  Number falls in past yr: 0 0 0 0 -  Injury with Fall? 0 0 0 1 -  Comment - - - - -  Follow up Falls evaluation completed Falls evaluation completed - Falls evaluation completed -     Allergies  Allergen Reactions  . Penicillins     Has patient had a PCN reaction causing immediate rash, facial/tongue/throat swelling, SOB or lightheadedness with hypotension: Yes Has patient had a PCN reaction causing severe rash involving mucus membranes or skin necrosis: No Has patient had a PCN reaction that required hospitalization No Has patient had a PCN reaction occurring within the last 10 years: No If all of the above answers are "NO", then may proceed with Cephalosporin use.   . Sulfa Antibiotics     Rash  Prior to Admission medications   Medication Sig Start Date End Date Taking? Authorizing Provider  almotriptan (AXERT) 6.25 MG tablet Take 1 tablet (6.25 mg total) by mouth as needed for migraine. 08/26/18  Yes Sagardia, Ines Bloomer, MD  aspirin-acetaminophen-caffeine [facility] MIGRAINE) [phone] MG tablet Take by mouth every 6 (six) hours as needed for headache.   Yes [provider]  fluticasone (FLONASE) 50 MCG/ACT nasal spray Place 2 sprays into both nostrils daily. 10/25/19  Yes Forrest Moron, MD  levothyroxine (SYNTHROID) 88 MCG tablet Take 1 tablet (88 mcg total) by mouth daily. 10/26/19  Yes Stallings, Zoe A, MD  PARoxetine (PAXIL) 40 MG tablet Take 1 tablet (40 mg total) by mouth daily. 10/25/19  Yes  Forrest Moron, MD    Past Medical History:  Diagnosis Date  . Allergy   . Arthritis   . Depression   . Migraine   . Thyroid disease     History reviewed. No pertinent surgical history.  Social History   Tobacco Use  . Smoking status: Never Smoker  . Smokeless tobacco: Never Used  Substance Use Topics  . Alcohol use: Yes    Alcohol/week: 3.0 standard drinks    Types: 3 Standard drinks or equivalent per week    Comment: 2 a week    Family History  Problem Relation Age of Onset  . Hypertension Mother   . Stroke Mother   . Mental illness Mother   . Cancer Sister        ovarian cancer  . Mental illness Sister   . Heart disease Paternal Grandfather   . Mental illness Brother   . Cancer Maternal Grandmother   . Mental illness Maternal Grandmother   . Mental illness Brother   . Mental illness Sister     Review of Systems  Constitutional: Negative for malaise/fatigue.  Eyes: Negative for blurred vision and double vision.  Respiratory: Negative for cough, shortness of breath and wheezing.   Cardiovascular: Negative for chest pain, palpitations and leg swelling.  Gastrointestinal: Negative for abdominal pain, blood in stool, constipation, diarrhea, heartburn, nausea and vomiting.  Genitourinary: Negative for dysuria, frequency and hematuria.  Musculoskeletal: Negative for back pain and joint pain.  Skin: Negative for rash.  Neurological: Negative for dizziness, tingling, weakness and headaches.  Endo/Heme/Allergies: Does not bruise/bleed easily.  Psychiatric/Behavioral: Negative for depression and suicidal ideas. The patient is not nervous/anxious.      OBJECTIVE:  Today's Vitals   09/18/20 0812  BP: 105/66  Pulse: 83  Temp: 97.6 F (36.4 C)  SpO2: 97%  Weight: 148 lb (67.1 kg)  Height: 5' 3.5" (1.613 m)   Body mass index is 25.81 kg/m.   Physical Exam Constitutional:      General: She is not in acute distress.    Appearance: Normal appearance. She  is not ill-appearing.  HENT:     Head: Normocephalic and atraumatic.     Nose: Nose normal. No congestion or rhinorrhea.  Cardiovascular:     Rate and Rhythm: Normal rate and regular rhythm.     Pulses: Normal pulses.     Heart sounds: Normal heart sounds. No murmur heard. No friction rub. No gallop.   Pulmonary:     Effort: Pulmonary effort is normal. No respiratory distress.     Breath sounds: Normal breath sounds. No stridor. No wheezing, rhonchi or rales.  Abdominal:     General: Bowel sounds are normal. There is no distension.     Palpations: Abdomen is  soft.     Tenderness: There is no abdominal tenderness.  Musculoskeletal:     Right lower leg: No edema.     Left lower leg: No edema.  Skin:    General: Skin is warm and dry.  Neurological:     Mental Status: She is alert and oriented to person, place, and time.  Psychiatric:        Mood and Affect: Mood normal.        Behavior: Behavior normal.     No results found for this or any previous visit (from the past 24 hour(s)).  No results found.   ASSESSMENT and PLAN  Problem List Items Addressed This Visit      Cardiovascular and Mediastinum   Migraine variants, not intractable   Relevant Orders   CMP14+EGFR     Endocrine   Hypothyroidism - Primary   Relevant Orders   TSH     Other   Elevated LDL cholesterol level   Relevant Orders   Lipid Panel    Other Visit Diagnoses    Environmental allergies       Relevant Medications   cetirizine (ZYRTEC) 10 MG tablet   oxymetazoline (AFRIN) 0.05 % nasal spray        Please send in cologuard, call if your kit is expired.   Return in about 6 months (around 03/21/2021).   Huston Foley Glennis Borger, FNP-BC [facility], [place] [address] Ph.  [phone] Fax [phone]

## 2020-09-19 LAB — CMP14+EGFR
ALT: 23 IU/L (ref 0–32)
AST: 28 IU/L (ref 0–40)
Albumin/Globulin Ratio: 2 (ref 1.2–2.2)
Albumin: 4.2 g/dL (ref 3.8–4.8)
Alkaline Phosphatase: 74 IU/L (ref 44–121)
BUN/Creatinine Ratio: 10 — ABNORMAL LOW (ref 12–28)
BUN: 7 mg/dL — ABNORMAL LOW (ref 8–27)
Bilirubin Total: 0.5 mg/dL (ref 0.0–1.2)
CO2: 23 mmol/L (ref 20–29)
Calcium: 8.9 mg/dL (ref 8.7–10.3)
Chloride: 102 mmol/L (ref 96–106)
Creatinine, Ser: 0.73 mg/dL (ref 0.57–1.00)
Globulin, Total: 2.1 g/dL (ref 1.5–4.5)
Glucose: 93 mg/dL (ref 65–99)
Potassium: 4.1 mmol/L (ref 3.5–5.2)
Sodium: 140 mmol/L (ref 134–144)
Total Protein: 6.3 g/dL (ref 6.0–8.5)
eGFR: 92 mL/min/{1.73_m2} (ref >59)

## 2020-09-19 LAB — TSH: TSH: 2.19 u[IU]/mL (ref 0.450–4.500)

## 2020-09-19 LAB — LIPID PANEL
Chol/HDL Ratio: 2.9 ratio (ref 0.0–4.4)
Cholesterol, Total: 240 mg/dL — ABNORMAL HIGH (ref 100–199)
HDL: 82 mg/dL (ref >39)
LDL Chol Calc (NIH): 138 mg/dL — ABNORMAL HIGH (ref 0–99)
Triglycerides: 117 mg/dL (ref 0–149)
VLDL Cholesterol Cal: 20 mg/dL (ref 5–40)

## 2020-09-19 NOTE — Progress Notes (Signed)
Overall labs look good. Cholesterol remains elevated continue to work on diet and exercise. If she would like to start a medication called crestor for that I can place an order to help her get those numbers down.

## 2020-11-01 ENCOUNTER — Ambulatory Visit: Payer: 59 | Admitting: Family Medicine

## 2020-12-25 IMAGING — MG DIGITAL SCREENING BILAT W/ CAD
4 series · 4 of 4 positions shown · non-contrast
Comparison: Previous exam(s).

CLINICAL DATA: Screening.

EXAM:
DIGITAL SCREENING BILATERAL MAMMOGRAM WITH CAD

[L CC]
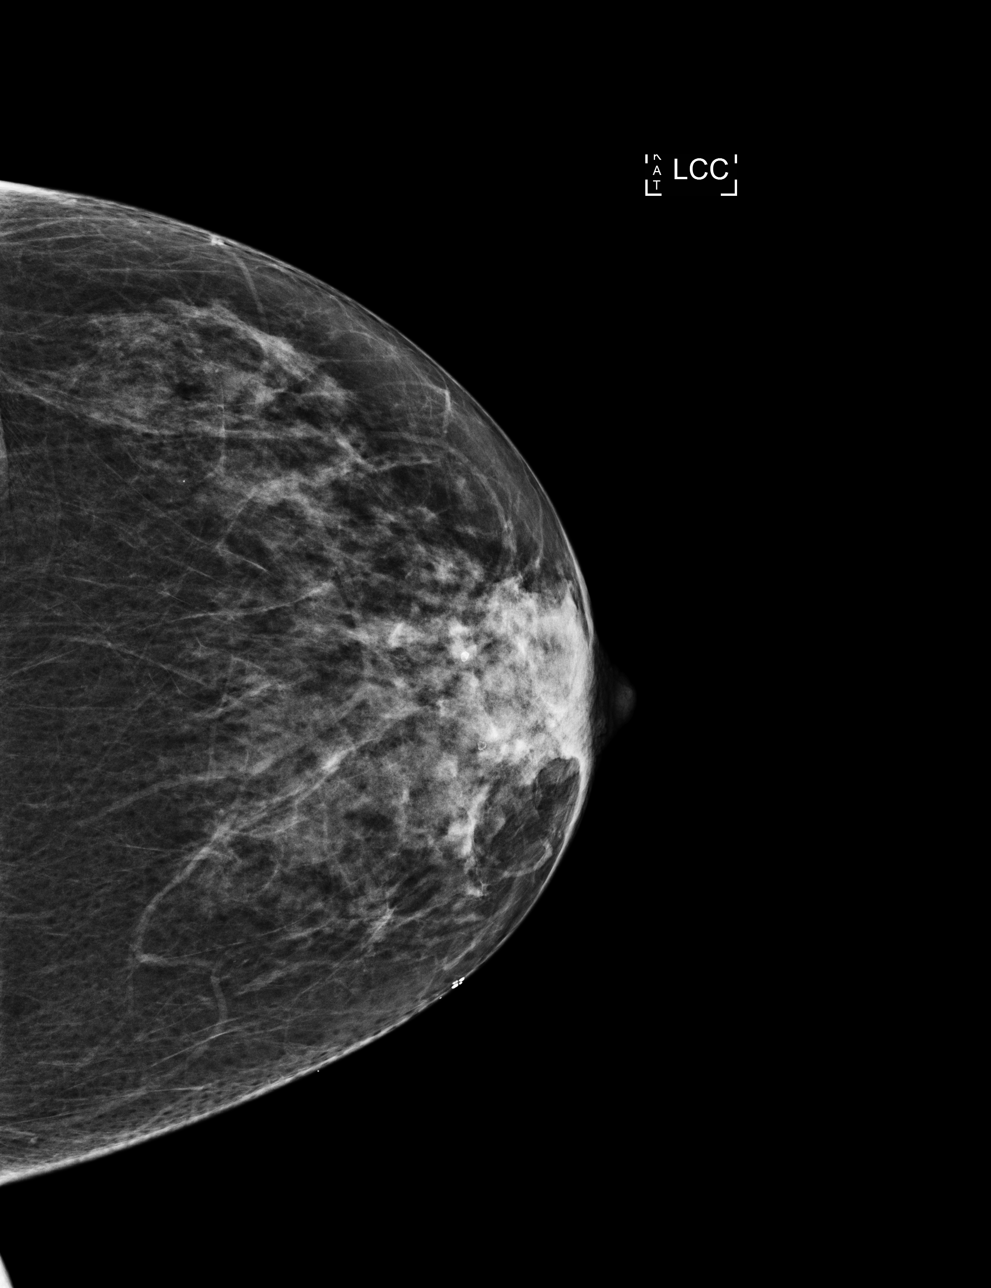

[R CC]
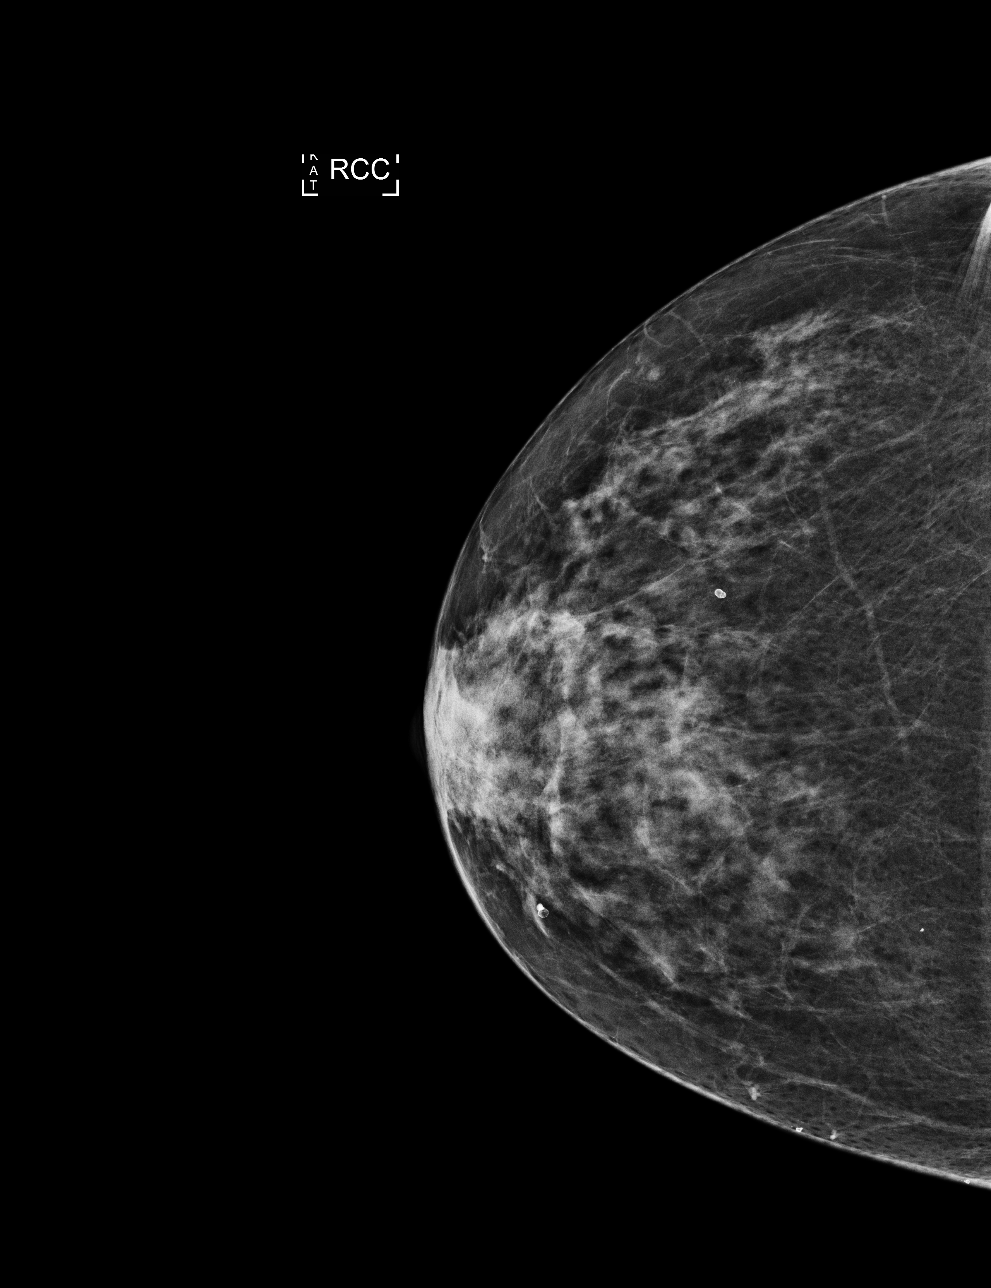

[R MLO]
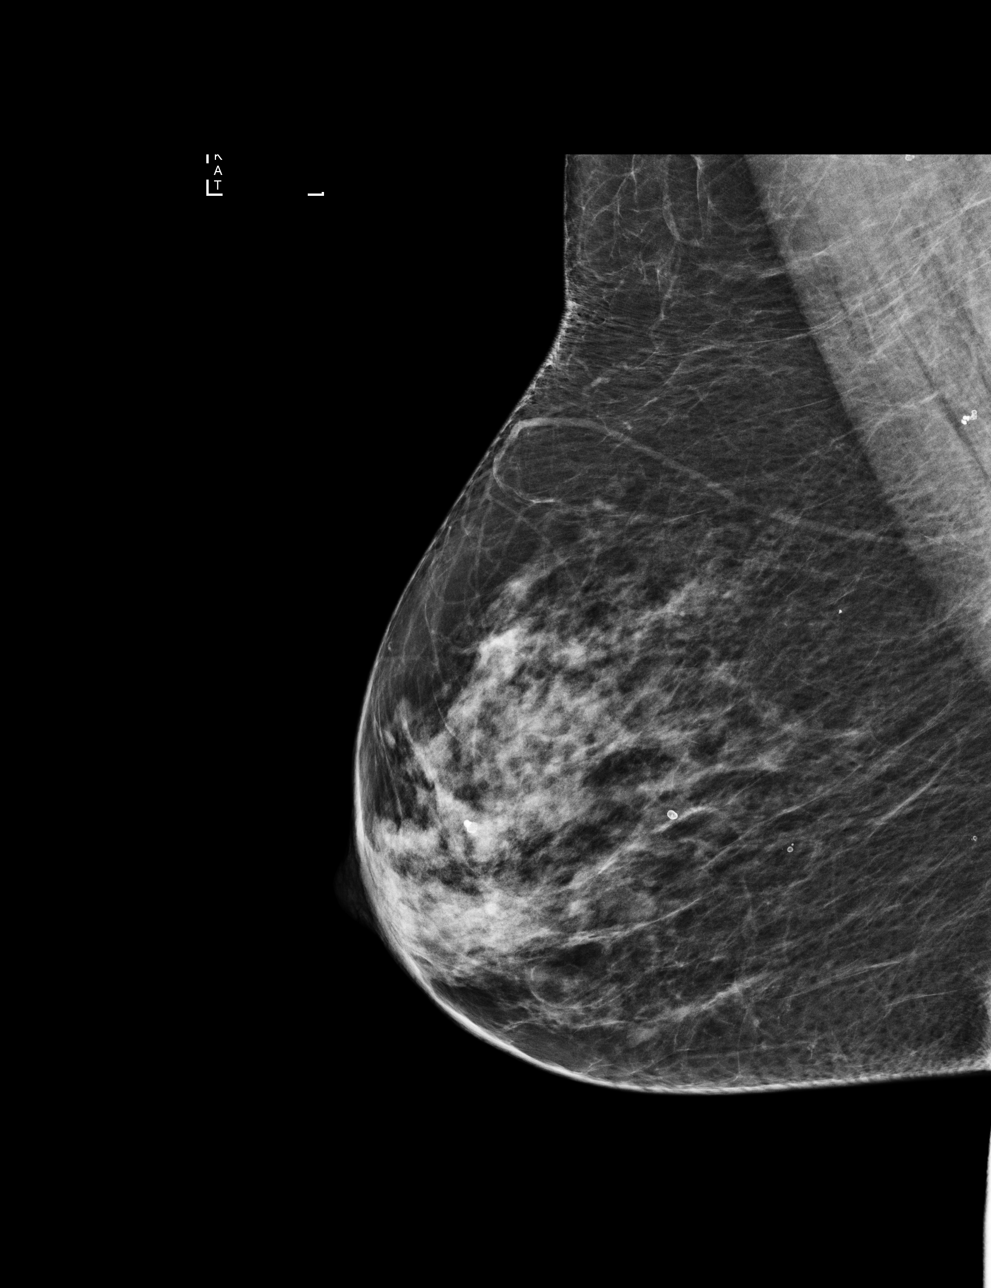

[L MLO]
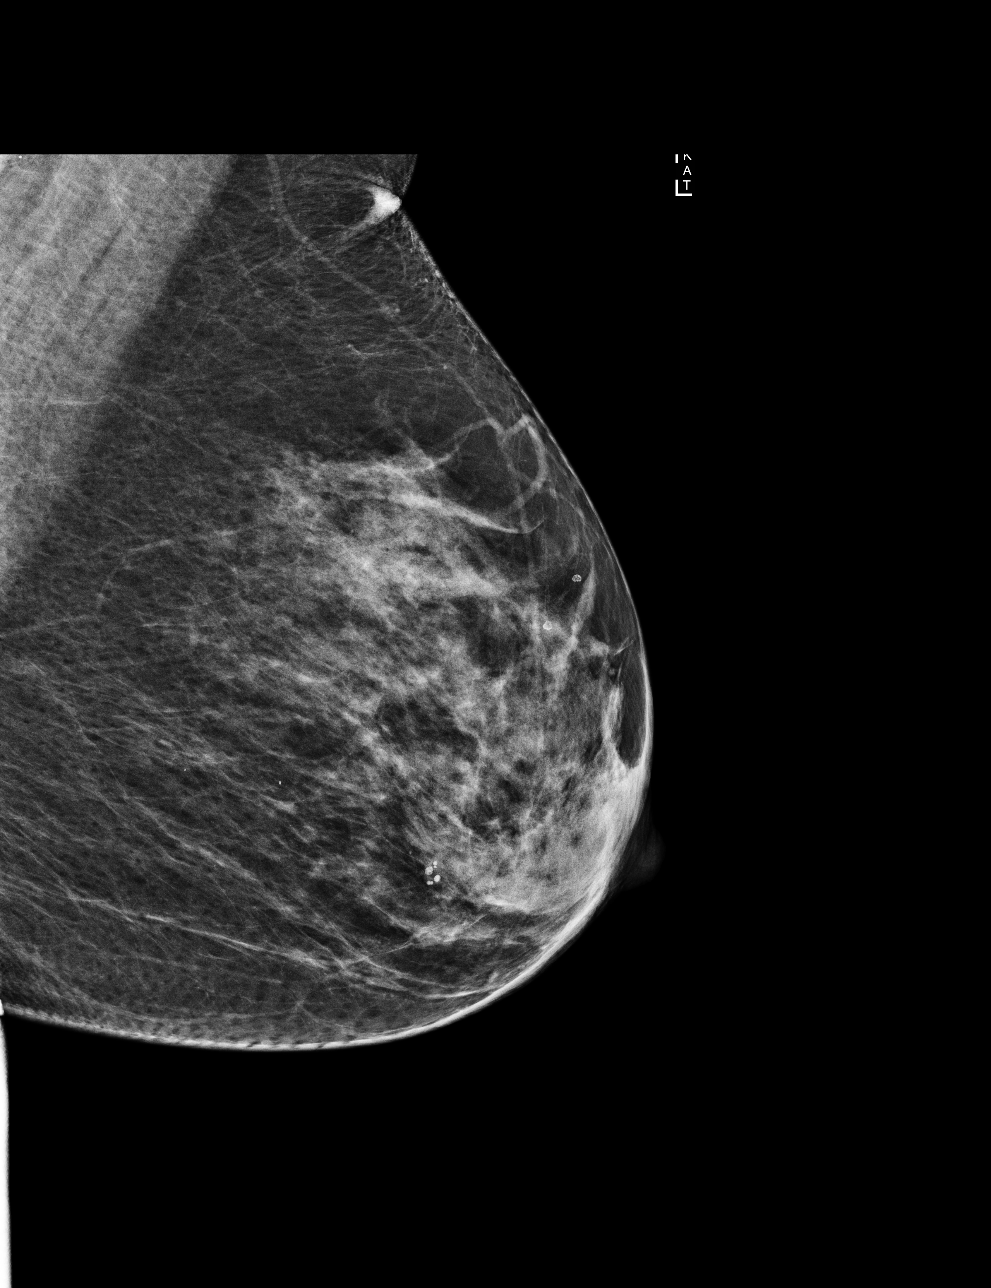

[4 of 4 positions shown; findings below may reference images not displayed]

ACR Breast Density Category c: The breast tissue is heterogeneously
dense, which may obscure small masses.
FINDINGS: There are no findings suspicious for malignancy. Images were
processed with CAD.
IMPRESSION: No mammographic evidence of malignancy. A result letter of this
screening mammogram will be mailed directly to the patient.

RECOMMENDATION:
Screening mammogram in one year. (Code:[id number])

BI-RADS CATEGORY  1: Negative.

## 2021-03-26 ENCOUNTER — Ambulatory Visit: Payer: 59 | Admitting: Family

## 2021-03-26 ENCOUNTER — Other Ambulatory Visit: Payer: Self-pay

## 2021-03-26 ENCOUNTER — Ambulatory Visit (INDEPENDENT_AMBULATORY_CARE_PROVIDER_SITE_OTHER): Payer: 59 | Admitting: Family

## 2021-03-26 ENCOUNTER — Encounter: Payer: Self-pay | Admitting: Family

## 2021-03-26 VITALS — BP 140/88 | HR 87 | Temp 97.1°F | Resp 16 | Ht 63.5 in | Wt 155.6 lb

## 2021-03-26 DIAGNOSIS — F3342 Major depressive disorder, recurrent, in full remission: Secondary | ICD-10-CM

## 2021-03-26 DIAGNOSIS — Z1211 Encounter for screening for malignant neoplasm of colon: Secondary | ICD-10-CM | POA: Diagnosis not present

## 2021-03-26 DIAGNOSIS — Z7689 Persons encountering health services in other specified circumstances: Secondary | ICD-10-CM | POA: Diagnosis not present

## 2021-03-26 DIAGNOSIS — E039 Hypothyroidism, unspecified: Secondary | ICD-10-CM

## 2021-03-26 MED ORDER — PAROXETINE HCL 40 MG PO TABS: 40.0000 mg | ORAL_TABLET | Freq: Every day | ORAL | 1 refills | Status: DC

## 2021-03-26 MED ORDER — LEVOTHYROXINE SODIUM 100 MCG PO TABS: 100.0000 ug | ORAL_TABLET | Freq: Every day | ORAL | 1 refills | Status: DC

## 2021-03-26 NOTE — Progress Notes (Signed)
Provider: Marlowe Sax FNP-C   Chozen Latulippe, Nelda Bucks, NP  Patient Care Team: Laurita Peron, Nelda Bucks, NP as PCP - General (Family Medicine)  Extended Emergency Contact Information Primary Emergency Contact: Mikel Cella of [place] Phone: [phone] Mobile Phone: [phone] Relation: Sister  Code Status:  Goals of care: Advanced Directive information Advanced Directives 01/11/2017  Does Patient Have a Medical Advance Directive? No  Would patient like information on creating a medical advance directive? -     Chief Complaint  Patient presents with   Establish Care    New Patient.     HPI:  Pt is a 65 y.o. female seen today to establish care here for medical management of chronic diseases.Has a medical history Hypothyroidism,Major depression,Osteoarthritis,Migraine among other conditions.  She denies any acute issues. Due for colonoscopy.Request cologuad.States was supposed to be ordered by previous provided. Reports exercise weekly 1-2 times month. Drinks 2-3 alcohol per week.    Past Medical History:  Diagnosis Date   Allergy    Arthritis    Depression    H/O mammogram 2021   History of Papanicolaou smear of cervix 2020   Migraine    Thyroid disease    History reviewed. No pertinent surgical history.  Allergies  Allergen Reactions   Penicillins     Has patient had a PCN reaction causing immediate rash, facial/tongue/throat swelling, SOB or lightheadedness with hypotension: Yes Has patient had a PCN reaction causing severe rash involving mucus membranes or skin necrosis: No Has patient had a PCN reaction that required hospitalization No Has patient had a PCN reaction occurring within the last 10 years: No If all of the above answers are "NO", then may proceed with Cephalosporin use.    Sulfa Antibiotics     Rash    Allergies as of 03/26/2021       Reactions   Penicillins    Has patient had a PCN reaction causing immediate rash,  facial/tongue/throat swelling, SOB or lightheadedness with hypotension: Yes Has patient had a PCN reaction causing severe rash involving mucus membranes or skin necrosis: No Has patient had a PCN reaction that required hospitalization No Has patient had a PCN reaction occurring within the last 10 years: No If all of the above answers are "NO", then may proceed with Cephalosporin use.   Sulfa Antibiotics    Rash        Medication List        Accurate as of March 26, 2021  2:03 PM. If you have any questions, ask your nurse or doctor.          STOP taking these medications    fluticasone 50 MCG/ACT nasal spray Commonly known as: FLONASE Stopped by: Nelda Bucks Sunny Gains, NP   oxymetazoline 0.05 % nasal spray Commonly known as: AFRIN Stopped by: Sandrea Hughs, NP       TAKE these medications    almotriptan 6.25 MG tablet Commonly known as: AXERT Take 1 tablet (6.25 mg total) by mouth as needed for migraine.   cetirizine 10 MG tablet Commonly known as: ZYRTEC Take 1 tablet (10 mg total) by mouth daily.   levothyroxine 100 MCG tablet Commonly known as: SYNTHROID Take 1 tablet (100 mcg total) by mouth daily.   MULTIVITAMIN PO Take 1 capsule by mouth daily.   PARoxetine 40 MG tablet Commonly known as: PAXIL Take 1 tablet (40 mg total) by mouth daily.   TYLENOL PO Take 2 capsules by mouth as needed.  Review of Systems  Constitutional:  Negative for appetite change, chills, fatigue, fever and unexpected weight change.  HENT:  Positive for hearing loss. Negative for congestion, dental problem, ear discharge, ear pain, facial swelling, nosebleeds, postnasal drip, rhinorrhea, sinus pressure, sinus pain, sneezing, sore throat, tinnitus and trouble swallowing.        Allergies  Eyes:  Positive for visual disturbance. Negative for pain, discharge, redness and itching.       Corrective lens cataracts  Respiratory:  Negative for cough, chest tightness,  shortness of breath and wheezing.   Cardiovascular:  Negative for chest pain, palpitations and leg swelling.  Gastrointestinal:  Negative for abdominal distention, abdominal pain, blood in stool, constipation, diarrhea, nausea and vomiting.  Endocrine: Negative for cold intolerance, polydipsia, polyphagia and polyuria.       Hypothyroidism  Genitourinary:  Negative for difficulty urinating, dysuria, flank pain, frequency and urgency.  Musculoskeletal:  Negative for arthralgias, back pain, gait problem, joint swelling, myalgias, neck pain and neck stiffness.  Skin:  Negative for color change, pallor, rash and wound.  Neurological:  Negative for dizziness, syncope, speech difficulty, weakness, light-headedness, numbness and headaches.  Hematological:  Does not bruise/bleed easily.  Psychiatric/Behavioral:  Negative for agitation, behavioral problems, confusion, hallucinations, self-injury, sleep disturbance and suicidal ideas. The patient is not nervous/anxious.        Depression stable    Immunization History  Administered Date(s) Administered   Influenza Inj Mdck Quad With Preservative 05/13/2018   Influenza,inj,Quad PF,6+ Mos 04/14/2017   Influenza-Unspecified 04/18/2014, 05/02/2015, 04/23/2020   PFIZER(Purple Top)SARS-COV-2 Vaccination 09/23/2019, 10/13/2019   Tdap 07/16/2011   Pertinent  Health Maintenance Due  Topic Date Due   COLONOSCOPY (Pts 45-7yr Insurance coverage will need to be confirmed)  Never done   INFLUENZA VACCINE  02/12/2021   MAMMOGRAM  11/01/2021   PAP SMEAR-Modifier  10/25/2022   Fall Risk  05/02/2020 10/25/2019 04/30/2019 08/26/2018 03/25/2018  Falls in the past year? 0 0 0 1 No  Number falls in past yr: 0 0 0 0 -  Injury with Fall? 0 0 0 1 -  Comment - - - - -  Follow up Falls evaluation completed Falls evaluation completed - Falls evaluation completed -   Functional Status Survey:    Vitals:   03/26/21 1327  BP: 140/88  Pulse: 87  Resp: 16  Temp: (!)  97.1 F (36.2 C)  SpO2: 97%  Weight: 155 lb 9.6 oz (70.6 kg)  Height: 5' 3.5" (1.613 m)   Body mass index is 27.13 kg/m. Physical Exam Vitals reviewed.  Constitutional:      General: She is not in acute distress.    Appearance: Normal appearance. She is overweight. She is not ill-appearing or diaphoretic.  HENT:     Head: Normocephalic.     Right Ear: Tympanic membrane, ear canal and external ear normal. There is no impacted cerumen.     Left Ear: Tympanic membrane, ear canal and external ear normal. There is no impacted cerumen.     Nose: Nose normal. No congestion or rhinorrhea.     Mouth/Throat:     Mouth: Mucous membranes are moist.     Pharynx: Oropharynx is clear. No oropharyngeal exudate or posterior oropharyngeal erythema.  Eyes:     General: No scleral icterus.       Right eye: No discharge.        Left eye: No discharge.     Extraocular Movements: Extraocular movements intact.     Conjunctiva/sclera: Conjunctivae  normal.     Pupils: Pupils are equal, round, and reactive to light.  Neck:     Vascular: No carotid bruit.  Cardiovascular:     Rate and Rhythm: Normal rate and regular rhythm.     Pulses: Normal pulses.     Heart sounds: Normal heart sounds. No murmur heard.   No friction rub. No gallop.  Pulmonary:     Effort: Pulmonary effort is normal. No respiratory distress.     Breath sounds: Normal breath sounds. No wheezing, rhonchi or rales.  Chest:     Chest wall: No tenderness.  Abdominal:     General: Bowel sounds are normal. There is no distension.     Palpations: Abdomen is soft. There is no mass.     Tenderness: There is no abdominal tenderness. There is no right CVA tenderness, left CVA tenderness, guarding or rebound.  Musculoskeletal:        General: No swelling or tenderness. Normal range of motion.     Cervical back: Normal range of motion. No rigidity or tenderness.     Right lower leg: No edema.     Left lower leg: No edema.  Lymphadenopathy:      Cervical: No cervical adenopathy.  Skin:    General: Skin is warm and dry.     Coloration: Skin is not pale.     Findings: No bruising, erythema, lesion or rash.  Neurological:     Mental Status: She is alert and oriented to person, place, and time.     Cranial Nerves: No cranial nerve deficit.     Sensory: No sensory deficit.     Motor: No weakness.     Coordination: Coordination normal.     Gait: Gait normal.  Psychiatric:        Mood and Affect: Mood normal.        Speech: Speech normal.        Behavior: Behavior normal.        Thought Content: Thought content normal.        Judgment: Judgment normal.    Labs reviewed: Recent Labs    09/18/20 0835  NA 140  K 4.1  CL 102  CO2 23  GLUCOSE 93  BUN 7*  CREATININE 0.73  CALCIUM 8.9   Recent Labs    09/18/20 0835  AST 28  ALT 23  ALKPHOS 74  BILITOT 0.5  PROT 6.3  ALBUMIN 4.2   No results for input(s): WBC, NEUTROABS, HGB, HCT, MCV, PLT in the last 8760 hours. Lab Results  Component Value Date   TSH 2.190 09/18/2020   No results found for: HGBA1C Lab Results  Component Value Date   CHOL 240 (H) 09/18/2020   HDL 82 09/18/2020   LDLCALC 138 (H) 09/18/2020   LDLDIRECT 151 (H) 11/02/2013   TRIG 117 09/18/2020   CHOLHDL 2.9 09/18/2020    Significant Diagnostic Results in last 30 days:  No results found.  Assessment/Plan 1. Acquired hypothyroidism Lab Results  Component Value Date   TSH 2.190 09/18/2020  Continue on levothyroxine  - levothyroxine (SYNTHROID) 100 MCG tablet; Take 1 tablet (100 mcg total) by mouth daily.  Dispense: 90 tablet; Refill: 1  2. Recurrent major depressive disorder, in full remission ([place]) Mood stable  - continue on paroxetine  - PARoxetine (PAXIL) 40 MG tablet; Take 1 tablet (40 mg total) by mouth daily.  Dispense: 90 tablet; Refill: 1  3. Encounter to establish care Medication and labs reviewed patient counselled  regarding yearly exam recommended schedule for routine  labs.  4. Colon cancer screening Asymptomatic  - Cologuard; Future  Family/ staff Communication: Reviewed plan of care with patient verbalized understanding.  Labs/tests ordered: None   Next Appointment : one week for Annual Physical Examination with fasting labs.  Sandrea Hughs, NP

## 2021-03-30 ENCOUNTER — Other Ambulatory Visit: Payer: Self-pay

## 2021-03-30 ENCOUNTER — Ambulatory Visit (INDEPENDENT_AMBULATORY_CARE_PROVIDER_SITE_OTHER): Payer: 59 | Admitting: Family

## 2021-03-30 ENCOUNTER — Encounter: Payer: Self-pay | Admitting: Family

## 2021-03-30 VITALS — BP 120/70 | HR 71 | Temp 97.3°F | Ht 63.5 in | Wt 153.6 lb

## 2021-03-30 DIAGNOSIS — Z0000 Encounter for general adult medical examination without abnormal findings: Secondary | ICD-10-CM | POA: Diagnosis not present

## 2021-03-30 LAB — CBC WITH DIFFERENTIAL/PLATELET
Absolute Monocytes: 542 cells/uL (ref 200–950)
Basophils Absolute: 42 cells/uL (ref 0–200)
Basophils Relative: 1 %
Eosinophils Absolute: 160 cells/uL (ref 15–500)
Eosinophils Relative: 3.8 %
HCT: 37 % (ref 35.0–45.0)
Hemoglobin: 12.6 g/dL (ref 11.7–15.5)
Lymphs Abs: 1785 cells/uL (ref 850–3900)
MCH: 32.3 pg (ref 27.0–33.0)
MCHC: 34.1 g/dL (ref 32.0–36.0)
MCV: 94.9 fL (ref 80.0–100.0)
MPV: 10.3 fL (ref 7.5–12.5)
Monocytes Relative: 12.9 %
Neutro Abs: 1672 cells/uL (ref 1500–7800)
Neutrophils Relative %: 39.8 %
Platelets: 221 10*3/uL (ref 140–400)
RBC: 3.9 10*6/uL (ref 3.80–5.10)
RDW: 12.1 % (ref 11.0–15.0)
Total Lymphocyte: 42.5 %
WBC: 4.2 10*3/uL (ref 3.8–10.8)

## 2021-03-30 LAB — COMPREHENSIVE METABOLIC PANEL
AG Ratio: 2 (calc) (ref 1.0–2.5)
ALT: 33 U/L — ABNORMAL HIGH (ref 6–29)
AST: 26 U/L (ref 10–35)
Albumin: 4.2 g/dL (ref 3.6–5.1)
Alkaline phosphatase (APISO): 72 U/L (ref 37–153)
BUN: 12 mg/dL (ref 7–25)
CO2: 28 mmol/L (ref 20–32)
Calcium: 9 mg/dL (ref 8.6–10.4)
Chloride: 105 mmol/L (ref 98–110)
Creat: 0.67 mg/dL (ref 0.50–1.05)
Globulin: 2.1 g/dL (calc) (ref 1.9–3.7)
Glucose, Bld: 94 mg/dL (ref 65–99)
Potassium: 4.1 mmol/L (ref 3.5–5.3)
Sodium: 139 mmol/L (ref 135–146)
Total Bilirubin: 0.6 mg/dL (ref 0.2–1.2)
Total Protein: 6.3 g/dL (ref 6.1–8.1)

## 2021-03-30 LAB — LIPID PANEL
Cholesterol: 236 mg/dL — ABNORMAL HIGH (ref <200)
HDL: 84 mg/dL (ref >=50)
LDL Cholesterol (Calc): 136 mg/dL (calc) — ABNORMAL HIGH
Non-HDL Cholesterol (Calc): 152 mg/dL (calc) — ABNORMAL HIGH (ref <130)
Total CHOL/HDL Ratio: 2.8 (calc) (ref <5.0)
Triglycerides: 70 mg/dL (ref <150)

## 2021-03-30 LAB — TSH: TSH: 0.77 mIU/L (ref 0.40–4.50)

## 2021-03-30 NOTE — Patient Instructions (Signed)
Health Maintenance, Female Adopting a healthy lifestyle and getting preventive care are important in promoting health and wellness. Ask your health care provider about: The right schedule for you to have regular tests and exams. Things you can do on your own to prevent diseases and keep yourself healthy. What should I know about diet, weight, and exercise? Eat a healthy diet  Eat a diet that includes plenty of vegetables, fruits, low-fat dairy products, and lean protein. Do not eat a lot of foods that are high in solid fats, added sugars, or sodium. Maintain a healthy weight Body mass index (BMI) is used to identify weight problems. It estimates body fat based on height and weight. Your health care provider can help determine your BMI and help you achieve or maintain a healthy weight. Get regular exercise Get regular exercise. This is one of the most important things you can do for your health. Most adults should: Exercise for at least 150 minutes each week. The exercise should increase your heart rate and make you sweat (moderate-intensity exercise). Do strengthening exercises at least twice a week. This is in addition to the moderate-intensity exercise. Spend less time sitting. Even light physical activity can be beneficial. Watch cholesterol and blood lipids Have your blood tested for lipids and cholesterol at 64 years of age, then have this test every 5 years. Have your cholesterol levels checked more often if: Your lipid or cholesterol levels are high. You are older than 64 years of age. You are at high risk for heart disease. What should I know about cancer screening? Depending on your health history and family history, you may need to have cancer screening at various ages. This may include screening for: Breast cancer. Cervical cancer. Colorectal cancer. Skin cancer. Lung cancer. What should I know about heart disease, diabetes, and high blood pressure? Blood pressure and heart  disease High blood pressure causes heart disease and increases the risk of stroke. This is more likely to develop in people who have high blood pressure readings, are of African descent, or are overweight. Have your blood pressure checked: Every 3-5 years if you are 18-39 years of age. Every year if you are 40 years old or older. Diabetes Have regular diabetes screenings. This checks your fasting blood sugar level. Have the screening done: Once every three years after age 40 if you are at a normal weight and have a low risk for diabetes. More often and at a younger age if you are overweight or have a high risk for diabetes. What should I know about preventing infection? Hepatitis B If you have a higher risk for hepatitis B, you should be screened for this virus. Talk with your health care provider to find out if you are at risk for hepatitis B infection. Hepatitis C Testing is recommended for: Everyone born from 1945 through 1965. Anyone with known risk factors for hepatitis C. Sexually transmitted infections (STIs) Get screened for STIs, including gonorrhea and chlamydia, if: You are sexually active and are younger than 64 years of age. You are older than 64 years of age and your health care provider tells you that you are at risk for this type of infection. Your sexual activity has changed since you were last screened, and you are at increased risk for chlamydia or gonorrhea. Ask your health care provider if you are at risk. Ask your health care provider about whether you are at high risk for HIV. Your health care provider may recommend a prescription medicine   to help prevent HIV infection. If you choose to take medicine to prevent HIV, you should first get tested for HIV. You should then be tested every 3 months for as long as you are taking the medicine. Pregnancy If you are about to stop having your period (premenopausal) and you may become pregnant, seek counseling before you get  pregnant. Take 400 to 800 micrograms (mcg) of folic acid every day if you become pregnant. Ask for birth control (contraception) if you want to prevent pregnancy. Osteoporosis and menopause Osteoporosis is a disease in which the bones lose minerals and strength with aging. This can result in bone fractures. If you are 10 years old or older, or if you are at risk for osteoporosis and fractures, ask your health care provider if you should: Be screened for bone loss. Take a calcium or vitamin D supplement to lower your risk of fractures. Be given hormone replacement therapy (HRT) to treat symptoms of menopause. Follow these instructions at home: Lifestyle Do not use any products that contain nicotine or tobacco, such as cigarettes, e-cigarettes, and chewing tobacco. If you need help quitting, ask your health care provider. Do not use street drugs. Do not share needles. Ask your health care provider for help if you need support or information about quitting drugs. Alcohol use Do not drink alcohol if: Your health care provider tells you not to drink. You are pregnant, may be pregnant, or are planning to become pregnant. If you drink alcohol: Limit how much you use to 0-1 drink a day. Limit intake if you are breastfeeding. Be aware of how much alcohol is in your drink. In the [place] one drink equals one 12 oz bottle of beer (355 mL), one 5 oz glass of wine (148 mL), or one 1 oz glass of hard liquor (44 mL). General instructions Schedule regular health, dental, and eye exams. Stay current with your vaccines. Tell your health care provider if: You often feel depressed. You have ever been abused or do not feel safe at home. Summary Adopting a healthy lifestyle and getting preventive care are important in promoting health and wellness. Follow your health care provider's instructions about healthy diet, exercising, and getting tested or screened for diseases. Follow your health care provider's  instructions on monitoring your cholesterol and blood pressure. This information is not intended to replace advice given to you by your health care provider. Make sure you discuss any questions you have with your health care provider. Document Revised: 09/08/2020 Document Reviewed: 06/24/2018 Elsevier Patient Education  2022 [place].     Why follow it? Research shows. Those who follow the Mediterranean diet have a reduced risk of heart disease  The diet is associated with a reduced incidence of Parkinson's and Alzheimer's diseases People following the diet may have longer life expectancies and lower rates of chronic diseases  The Dietary Guidelines for Americans recommends the Mediterranean diet as an eating plan to promote health and prevent disease  What Is the Mediterranean Diet?  Healthy eating plan based on typical foods and recipes of Mediterranean-style cooking The diet is primarily a plant based diet; these foods should make up a majority of meals   Starches - Plant based foods should make up a majority of meals - They are an important sources of vitamins, minerals, energy, antioxidants, and fiber - Choose whole grains, foods high in fiber and minimally processed items  - Typical grain sources include wheat, oats, barley, corn, brown rice, bulgar, farro, millet, polenta,  couscous  - Various types of beans include chickpeas, lentils, fava beans, black beans, white beans   Fruits  Veggies - Large quantities of antioxidant rich fruits & veggies; 6 or more servings  - Vegetables can be eaten raw or lightly drizzled with oil and cooked  - Vegetables common to the traditional Mediterranean Diet include: artichokes, arugula, beets, broccoli, brussel sprouts, cabbage, carrots, celery, collard greens, cucumbers, eggplant, kale, leeks, lemons, lettuce, mushrooms, okra, onions, peas, peppers, potatoes, pumpkin, radishes, rutabaga, shallots, spinach, sweet potatoes, turnips, zucchini -  Fruits common to the Mediterranean Diet include: apples, apricots, avocados, cherries, clementines, dates, figs, grapefruits, grapes, melons, nectarines, oranges, peaches, pears, pomegranates, strawberries, tangerines  Fats - Replace butter and margarine with healthy oils, such as olive oil, canola oil, and tahini  - Limit nuts to no more than a handful a day  - Nuts include walnuts, almonds, pecans, pistachios, pine nuts  - Limit or avoid candied, honey roasted or heavily salted nuts - Olives are central to the [employer] - can be eaten whole or used in a variety of dishes   Meats Protein - Limiting red meat: no more than a few times a month - When eating red meat: choose lean cuts and keep the portion to the size of deck of cards - Eggs: approx. 0 to 4 times a week  - Fish and lean poultry: at least 2 a week  - Healthy protein sources include, chicken, [place], lean beef, lamb - Increase intake of seafood such as tuna, salmon, trout, mackerel, shrimp, scallops - Avoid or limit high fat processed meats such as sausage and bacon  Dairy - Include moderate amounts of low fat dairy products  - Focus on healthy dairy such as fat free yogurt, skim milk, low or reduced fat cheese - Limit dairy products higher in fat such as whole or 2% milk, cheese, ice cream  Alcohol - Moderate amounts of red wine is ok  - No more than 5 oz daily for women (all ages) and men older than age 42  - No more than 10 oz of wine daily for men younger than 83  Other - Limit sweets and other desserts  - Use herbs and spices instead of salt to flavor foods  - Herbs and spices common to the traditional Mediterranean Diet include: basil, bay leaves, chives, cloves, cumin, fennel, garlic, lavender, marjoram, mint, oregano, parsley, pepper, rosemary, sage, savory, sumac, tarragon, thyme   It's not just a diet, it's a lifestyle:  The Mediterranean diet includes lifestyle factors typical of those in the region  Foods,  drinks and meals are best eaten with others and savored Daily physical activity is important for overall good health This could be strenuous exercise like running and aerobics This could also be more leisurely activities such as walking, housework, yard-work, or taking the stairs Moderation is the key; a balanced and healthy diet accommodates most foods and drinks Consider portion sizes and frequency of consumption of certain foods   Meal Ideas & Options:  Breakfast:  Whole wheat toast or whole wheat English muffins with peanut butter & hard boiled egg Steel cut oats topped with apples & cinnamon and skim milk  Fresh fruit: banana, strawberries, melon, berries, peaches  Smoothies: strawberries, bananas, greek yogurt, peanut butter Low fat greek yogurt with blueberries and granola  Egg white omelet with spinach and mushrooms Breakfast couscous: whole wheat couscous, apricots, skim milk, cranberries  Sandwiches:  Hummus and grilled vegetables (peppers, zucchini, squash)  on whole wheat bread   Grilled chicken on whole wheat pita with lettuce, tomatoes, cucumbers or tzatziki  [place] salad on whole wheat bread: tuna salad made with greek yogurt, olives, red peppers, capers, green onions Garlic rosemary lamb pita: lamb sauted with garlic, rosemary, salt & pepper; add lettuce, cucumber, greek yogurt to pita - flavor with lemon juice and black pepper  Seafood:  Mediterranean grilled salmon, seasoned with garlic, basil, parsley, lemon juice and black pepper Shrimp, lemon, and spinach whole-grain pasta salad made with low fat greek yogurt  Seared scallops with lemon orzo  Seared tuna steaks seasoned salt, pepper, coriander topped with tomato mixture of olives, tomatoes, olive oil, minced garlic, parsley, green onions and cappers  Meats:  Herbed greek chicken salad with kalamata olives, cucumber, feta  Red bell peppers stuffed with spinach, bulgur, lean ground beef (or lentils) & topped with feta    Kebabs: skewers of chicken, tomatoes, onions, zucchini, squash  [place] burgers: made with red onions, mint, dill, lemon juice, feta cheese topped with roasted red peppers Vegetarian Cucumber salad: cucumbers, artichoke hearts, celery, red onion, feta cheese, tossed in olive oil & lemon juice  Hummus and whole grain pita points with a greek salad (lettuce, tomato, feta, olives, cucumbers, red onion) Lentil soup with celery, carrots made with vegetable broth, garlic, salt and pepper  Tabouli salad: parsley, bulgur, mint, scallions, cucumbers, tomato, radishes, lemon juice, olive oil, salt and pepper.

## 2021-03-30 NOTE — Progress Notes (Signed)
Provider: Marlowe Sax FNP-C   Sharmila Wrobleski, Nelda Bucks, NP  Patient Care Team: Shamon Lobo, Nelda Bucks, NP as PCP - General (Family Medicine)  Extended Emergency Contact Information Primary Emergency Contact: Mikel Cella of [place] Phone: [phone] Mobile Phone: [phone] Relation: Sister  Code Status:  Full Code  Goals of care: Advanced Directive information Advanced Directives 03/30/2021  Does Patient Have a Medical Advance Directive? No  Would patient like information on creating a medical advance directive? -     Chief Complaint  Patient presents with   Annual Exam    Physical   Health Maintenance    Colonoscopy, Zoster, 2nd COVID booster, Flu vaccine    HPI:  Pt is a 65 y.o. female seen today for Annual Physical examination.she denies any acute issues today. Due for Influenza vaccine but states will be getting vaccine at her work place. 2nd COVID-19 and Zoster vaccine to be administered at the pharmacy. Colo guard recently ordered at her previous visit.     Past Medical History:  Diagnosis Date   Allergy    Arthritis    Depression    H/O mammogram 2021   History of Papanicolaou smear of cervix 2020   Migraine    Thyroid disease    History reviewed. No pertinent surgical history.  Allergies  Allergen Reactions   Penicillins     Has patient had a PCN reaction causing immediate rash, facial/tongue/throat swelling, SOB or lightheadedness with hypotension: Yes Has patient had a PCN reaction causing severe rash involving mucus membranes or skin necrosis: No Has patient had a PCN reaction that required hospitalization No Has patient had a PCN reaction occurring within the last 10 years: No If all of the above answers are "NO", then may proceed with Cephalosporin use.    Sulfa Antibiotics     Rash    Allergies as of 03/30/2021       Reactions   Penicillins    Has patient had a PCN reaction causing immediate rash, facial/tongue/throat  swelling, SOB or lightheadedness with hypotension: Yes Has patient had a PCN reaction causing severe rash involving mucus membranes or skin necrosis: No Has patient had a PCN reaction that required hospitalization No Has patient had a PCN reaction occurring within the last 10 years: No If all of the above answers are "NO", then may proceed with Cephalosporin use.   Sulfa Antibiotics    Rash        Medication List        Accurate as of March 30, 2021  9:00 AM. If you have any questions, ask your nurse or doctor.          almotriptan 6.25 MG tablet Commonly known as: AXERT Take 1 tablet (6.25 mg total) by mouth as needed for migraine.   cetirizine 10 MG tablet Commonly known as: ZYRTEC Take 1 tablet (10 mg total) by mouth daily.   levothyroxine 100 MCG tablet Commonly known as: SYNTHROID Take 1 tablet (100 mcg total) by mouth daily.   MULTIVITAMIN PO Take 1 capsule by mouth daily.   PARoxetine 40 MG tablet Commonly known as: PAXIL Take 1 tablet (40 mg total) by mouth daily.   TYLENOL PO Take 2 capsules by mouth as needed.        Review of Systems  Constitutional:  Negative for appetite change, chills, fatigue, fever and unexpected weight change.  HENT:  Negative for congestion, dental problem, ear discharge, ear pain, facial swelling, hearing loss, nosebleeds, postnasal drip,  rhinorrhea, sinus pressure, sinus pain, sneezing, sore throat, tinnitus and trouble swallowing.   Eyes:  Negative for pain, discharge, redness, itching and visual disturbance.  Respiratory:  Negative for cough, chest tightness, shortness of breath and wheezing.   Cardiovascular:  Negative for chest pain, palpitations and leg swelling.  Gastrointestinal:  Negative for abdominal distention, abdominal pain, blood in stool, constipation, diarrhea, nausea and vomiting.  Endocrine: Negative for cold intolerance, heat intolerance, polydipsia, polyphagia and polyuria.  Genitourinary:  Negative  for difficulty urinating, dysuria, flank pain, frequency and urgency.  Musculoskeletal:  Negative for arthralgias, back pain, gait problem, joint swelling, myalgias, neck pain and neck stiffness.  Skin:  Negative for color change, pallor, rash and wound.  Neurological:  Negative for dizziness, syncope, speech difficulty, weakness, light-headedness, numbness and headaches.  Hematological:  Does not bruise/bleed easily.  Psychiatric/Behavioral:  Negative for agitation, behavioral problems, confusion, hallucinations, self-injury, sleep disturbance and suicidal ideas. The patient is not nervous/anxious.    Immunization History  Administered Date(s) Administered   Influenza Inj Mdck Quad With Preservative 05/13/2018   Influenza,inj,Quad PF,6+ Mos 04/14/2017   Influenza-Unspecified 04/18/2014, 05/02/2015, 04/23/2020   PFIZER(Purple Top)SARS-COV-2 Vaccination 09/23/2019, 10/13/2019, 07/02/2020   Tdap 07/16/2011   Pertinent  Health Maintenance Due  Topic Date Due   COLONOSCOPY (Pts 45-18yr Insurance coverage will need to be confirmed)  Never done   INFLUENZA VACCINE  02/12/2021   MAMMOGRAM  11/01/2021   PAP SMEAR-Modifier  10/25/2022   Fall Risk  05/02/2020 10/25/2019 04/30/2019 08/26/2018 03/25/2018  Falls in the past year? 0 0 0 1 No  Number falls in past yr: 0 0 0 0 -  Injury with Fall? 0 0 0 1 -  Comment - - - - -  Follow up Falls evaluation completed Falls evaluation completed - Falls evaluation completed -   Functional Status Survey:    Vitals:   03/30/21 0847  BP: 120/70  Pulse: 71  Temp: (!) 97.3 F (36.3 C)  SpO2: 97%  Weight: 153 lb 9.6 oz (69.7 kg)  Height: 5' 3.5" (1.613 m)   Body mass index is 26.78 kg/m. Physical Exam Vitals reviewed.  Constitutional:      General: She is not in acute distress.    Appearance: Normal appearance. She is normal weight. She is not ill-appearing or diaphoretic.  HENT:     Head: Normocephalic.     Right Ear: Tympanic membrane, ear  canal and external ear normal. There is no impacted cerumen.     Left Ear: Tympanic membrane, ear canal and external ear normal. There is no impacted cerumen.     Nose: Nose normal. No congestion or rhinorrhea.     Mouth/Throat:     Mouth: Mucous membranes are moist.     Pharynx: Oropharynx is clear. No oropharyngeal exudate or posterior oropharyngeal erythema.  Eyes:     General: No scleral icterus.       Right eye: No discharge.        Left eye: No discharge.     Extraocular Movements: Extraocular movements intact.     Conjunctiva/sclera: Conjunctivae normal.     Pupils: Pupils are equal, round, and reactive to light.  Neck:     Vascular: No carotid bruit.  Cardiovascular:     Rate and Rhythm: Normal rate and regular rhythm.     Pulses: Normal pulses.     Heart sounds: Normal heart sounds. No murmur heard.   No friction rub. No gallop.  Pulmonary:     Effort:  Pulmonary effort is normal. No respiratory distress.     Breath sounds: Normal breath sounds. No wheezing, rhonchi or rales.  Chest:     Chest wall: No tenderness.  Abdominal:     General: Bowel sounds are normal. There is no distension.     Palpations: Abdomen is soft. There is no mass.     Tenderness: There is no abdominal tenderness. There is no right CVA tenderness, left CVA tenderness, guarding or rebound.  Musculoskeletal:        General: No swelling or tenderness. Normal range of motion.     Cervical back: Normal range of motion. No rigidity or tenderness.     Right lower leg: No edema.     Left lower leg: No edema.  Lymphadenopathy:     Cervical: No cervical adenopathy.  Skin:    General: Skin is warm and dry.     Coloration: Skin is not pale.     Findings: No bruising, erythema, lesion or rash.  Neurological:     Mental Status: She is alert and oriented to person, place, and time.     Cranial Nerves: No cranial nerve deficit.     Sensory: No sensory deficit.     Motor: No weakness.     Coordination:  Coordination normal.     Gait: Gait normal.  Psychiatric:        Mood and Affect: Mood normal.        Speech: Speech normal.        Behavior: Behavior normal.        Thought Content: Thought content normal.        Judgment: Judgment normal.    Labs reviewed: Recent Labs    09/18/20 0835  NA 140  K 4.1  CL 102  CO2 23  GLUCOSE 93  BUN 7*  CREATININE 0.73  CALCIUM 8.9   Recent Labs    09/18/20 0835  AST 28  ALT 23  ALKPHOS 74  BILITOT 0.5  PROT 6.3  ALBUMIN 4.2   No results for input(s): WBC, NEUTROABS, HGB, HCT, MCV, PLT in the last 8760 hours. Lab Results  Component Value Date   TSH 2.190 09/18/2020   No results found for: HGBA1C Lab Results  Component Value Date   CHOL 240 (H) 09/18/2020   HDL 82 09/18/2020   LDLCALC 138 (H) 09/18/2020   LDLDIRECT 151 (H) 11/02/2013   TRIG 117 09/18/2020   CHOLHDL 2.9 09/18/2020    Significant Diagnostic Results in last 30 days:  No results found.  Assessment/Plan  Routine general medical examination at a health care facility Immunization up to date except Influenza vaccine which she will receive through her work place.2nd COVID-19 boost and Zooster to receive at the pharmacy.Medication and labs reviewed patient counselled regarding yearly exam, prevention of dental and periodontal disease, diet, regular sustained exercise for at least 30 minutes x 3 /week, recommended schedule for routine labs. - CBC with Differential - Comprehensive metabolic panel - Lipid panel - TSH Family/ staff Communication: Reviewed plan of care with patient verbalized understanding.   Labs/tests ordered:  - CBC with Differential - Comprehensive metabolic panel - Lipid panel - TSH  Next Appointment : 6 months for medical management of chronic issues.   Sandrea Hughs, NP

## 2021-04-03 ENCOUNTER — Other Ambulatory Visit: Payer: Self-pay

## 2021-04-03 DIAGNOSIS — E039 Hypothyroidism, unspecified: Secondary | ICD-10-CM

## 2021-04-03 DIAGNOSIS — E7800 Pure hypercholesterolemia, unspecified: Secondary | ICD-10-CM

## 2021-06-20 ENCOUNTER — Other Ambulatory Visit: Payer: Self-pay | Admitting: *Deleted

## 2021-06-20 DIAGNOSIS — G43809 Other migraine, not intractable, without status migrainosus: Secondary | ICD-10-CM

## 2021-06-20 MED ORDER — ALMOTRIPTAN MALATE 6.25 MG PO TABS: 6.2500 mg | ORAL_TABLET | ORAL | 5 refills | Status: DC | PRN

## 2021-06-20 NOTE — Telephone Encounter (Signed)
Patient called and stated that she forgot to ask if you would refill her Almotriptan at her last visit. Stated that her previous provider prescribed and she needs a new refill sent to [facility].   Pended Rx and sent to [facility] for approval.

## 2021-09-04 ENCOUNTER — Other Ambulatory Visit: Payer: Self-pay | Admitting: Family

## 2021-09-04 DIAGNOSIS — F3342 Major depressive disorder, recurrent, in full remission: Secondary | ICD-10-CM

## 2021-09-04 NOTE — Telephone Encounter (Signed)
Patient has request refill on medication "Paxil". Patient last refill on medication 03/26/2021. Patient medication has warning. Medication pend and sent to PCP N[place], Nelda Bucks, NP.

## 2021-09-12 ENCOUNTER — Telehealth (INDEPENDENT_AMBULATORY_CARE_PROVIDER_SITE_OTHER): Payer: Medicare Other | Admitting: Family

## 2021-09-12 ENCOUNTER — Encounter: Payer: Self-pay | Admitting: Family

## 2021-09-12 ENCOUNTER — Other Ambulatory Visit: Payer: Self-pay

## 2021-09-12 DIAGNOSIS — J069 Acute upper respiratory infection, unspecified: Secondary | ICD-10-CM

## 2021-09-12 DIAGNOSIS — U071 COVID-19: Secondary | ICD-10-CM | POA: Diagnosis not present

## 2021-09-12 MED ORDER — VITAMIN D3 50 MCG (2000 UT) PO CAPS: 2000.0000 [IU] | ORAL_CAPSULE | Freq: Every day | ORAL | 0 refills | Status: AC

## 2021-09-12 MED ORDER — NIRMATRELVIR/RITONAVIR (PAXLOVID)TABLET: 3.0000 | ORAL_TABLET | Freq: Two times a day (BID) | ORAL | 0 refills | Status: AC

## 2021-09-12 MED ORDER — ZINC GLUCONATE 50 MG PO TABS
50.0000 mg | ORAL_TABLET | Freq: Every day | ORAL | 0 refills | Status: AC
Start: 2021-09-12 — End: 2021-09-26

## 2021-09-12 MED ORDER — VITAMIN C 1000 MG PO TABS: 1000.0000 mg | ORAL_TABLET | Freq: Every day | ORAL | 0 refills | Status: AC

## 2021-09-12 NOTE — Patient Instructions (Signed)
-   nirmatrelvir/ritonavir EUA (PAXLOVID) 20 x 150 MG & 10 x 100MG  TABS; Take 3 tablets by mouth 2 (two) times daily for 5 days. (Take nirmatrelvir 150 mg two tablets twice daily for 5 days and ritonavir 100 mg one tablet twice daily for 5 days) Patient GFR is 92  Dispense: 30 tablet; Refill: 0 ? ?- Cholecalciferol (VITAMIN D3) 50 MCG (2000 UT) capsule; Take 1 capsule (2,000 Units total) by mouth daily for 14 days.  Dispense: 14 capsule; Refill: 0 ? ?- Ascorbic Acid (VITAMIN C) 1000 MG tablet; Take 1 tablet (1,000 mg total) by mouth daily for 14 days.  Dispense: 14 tablet; Refill: 0 ? ?- zinc gluconate 50 MG tablet; Take 1 tablet (50 mg total) by mouth daily for 14 days.  Dispense: 14 tablet; Refill: 0 ?- Tylenol as needed for fever or body aches  ? ?- over the counter Mucinex as needed for cough  ? ?- increase your fruits intake in your diet  ? ?- increase your water intake to 6-8 glasses of water daily /soup / Herbal tea  ? ?- Notify provider or go to ED if you develop any chest tightness,chest pain or shortness of breath  ? ? ? ? ?

## 2021-09-12 NOTE — Progress Notes (Signed)
This service is provided via telemedicine  No vital signs collected/recorded due to the encounter was a telemedicine visit.   Location of patient (ex: home, work):  Home  Patient consents to a telephone visit:  Yes  Location of the provider (ex: office, home):  [employer].   Name of any referring provider:  Jeffree Cazeau, Nelda Bucks, NP   Names of all persons participating in the telemedicine service and their role in the encounter:  Patient, Sheri Adkins, [place], [place], Webb Silversmith, NP.    Time spent on call: 8 minutes spent on the phone with Medical Assistant.    Provider: Marlowe Sax FNP-C  Sheri Adkins, Nelda Bucks, NP  Patient Care Team: Sheri Adkins, Nelda Bucks, NP as PCP - General (Family Medicine)  Extended Emergency Contact Information Primary Emergency Contact: Sheri Adkins of [place] Phone: [phone] Mobile Phone: [phone] Relation: Sister  Code Status:  Full Code  Goals of care: Advanced Directive information Advanced Directives 09/12/2021  Does Patient Have a Medical Advance Directive? No  Would patient like information on creating a medical advance directive? No - Patient declined     Chief Complaint  Patient presents with   Acute Visit    Patient complains of testing Positive for Covid-19 in the morning 09/12/2021. Patient symptoms started yesterday 09/11/2021. Patient symptoms are cough, fever, chills, congestion, and body aches.    HPI:  Pt is a 66 y.o. female seen today for an acute visit for evaluation of COVID-19 positive test 09/12/2021.states symptoms started yesterday.she had chills and Temp was 100.2 degrees.she has had body aches,runny nose but more sinus pressure ,headache,and congestion.also has intermittent cough thought it was Post nasal drip at first.Had slight sore throat in the beginning but none now.she denies any nausea,vomiting or diarrhea.Appetite is good but does not eat a lot. States went to church on Sunday and hanged  around after church talking with other members.However,thinks might have gotten exposed to sick person when she attended an event the [employer] on Sunday was in contact with many people too.Her mother told her someone who was at the event in the Library is sick with COVID-19 too.  She denies any She denies any chest tightness,chest pain,palpitation or shortness of breath.   Past Medical History:  Diagnosis Date   Allergy    Arthritis    Depression    H/O mammogram 2021   History of Papanicolaou smear of cervix 2020   Migraine    Thyroid disease    History reviewed. No pertinent surgical history.  Allergies  Allergen Reactions   Penicillins     Has patient had a PCN reaction causing immediate rash, facial/tongue/throat swelling, SOB or lightheadedness with hypotension: Yes Has patient had a PCN reaction causing severe rash involving mucus membranes or skin necrosis: No Has patient had a PCN reaction that required hospitalization No Has patient had a PCN reaction occurring within the last 10 years: No If all of the above answers are "NO", then may proceed with Cephalosporin use.    Sulfa Antibiotics     Rash    Outpatient Encounter Medications as of 09/12/2021  Medication Sig   Acetaminophen (TYLENOL PO) Take 2 capsules by mouth as needed.   almotriptan (AXERT) 6.25 MG tablet Take 1 tablet (6.25 mg total) by mouth as needed for migraine.   cetirizine (ZYRTEC) 10 MG tablet Take 1 tablet (10 mg total) by mouth daily.   IBUPROFEN PO Take 1 tablet by mouth as needed.   levothyroxine (SYNTHROID)  100 MCG tablet Take 1 tablet (100 mcg total) by mouth daily.   Multiple Vitamin (MULTIVITAMIN PO) Take 1 capsule by mouth daily.   PARoxetine (PAXIL) 40 MG tablet TAKE 1 TABLET DAILY   No facility-administered encounter medications on file as of 09/12/2021.    Review of Systems  Constitutional:  Positive for appetite change, chills, fatigue and fever.  HENT:  Positive for congestion, postnasal  drip, rhinorrhea and sinus pressure. Negative for sinus pain, sneezing and sore throat.   Eyes:  Negative for discharge, redness and itching.  Respiratory:  Positive for cough. Negative for chest tightness, shortness of breath and wheezing.   Cardiovascular:  Negative for chest pain, palpitations and leg swelling.  Gastrointestinal:  Negative for abdominal distention, abdominal pain, constipation, diarrhea, nausea and vomiting.  Musculoskeletal:  Negative for joint swelling, myalgias and neck pain.  Skin:  Negative for color change, pallor and rash.  Neurological:  Positive for headaches. Negative for dizziness, seizures, weakness, light-headedness and numbness.   Immunization History  Administered Date(s) Administered   Influenza Inj Mdck Quad With Preservative 05/13/2018   Influenza,inj,Quad PF,6+ Mos 04/14/2017   Influenza-Unspecified 04/18/2014, 05/02/2015, 04/23/2020   PFIZER(Purple Top)SARS-COV-2 Vaccination 09/23/2019, 10/13/2019, 07/02/2020   Tdap 07/16/2011   Pertinent  Health Maintenance Due  Topic Date Due   COLONOSCOPY (Pts 45-33yrs Insurance coverage will need to be confirmed)  Never done   INFLUENZA VACCINE  02/12/2021   MAMMOGRAM  11/01/2021   PAP SMEAR-Modifier  10/25/2022   DEXA SCAN  Completed   Fall Risk 08/26/2018 04/30/2019 10/25/2019 05/02/2020 09/12/2021  Falls in the past year? 1 0 0 0 0  Was there an injury with Fall? 1 0 0 0 0  Was there an injury with Fall? - - - - -  Fall Risk Category Calculator 2 0 0 0 0  Fall Risk Category Moderate Low Low Low Low  Patient Fall Risk Level Low fall risk - Low fall risk Low fall risk Low fall risk  Patient at Risk for Falls Due to - - - - No Fall Risks  Fall risk Follow up Falls evaluation completed - Falls evaluation completed Falls evaluation completed Falls evaluation completed   Functional Status Survey:    There were no vitals filed for this visit. There is no height or weight on file to calculate BMI. Physical  Exam Vitals reviewed.  Constitutional:      General: She is not in acute distress.    Appearance: She is not ill-appearing.  Pulmonary:     Effort: Pulmonary effort is normal. No respiratory distress.  Neurological:     Mental Status: She is alert and oriented to person, place, and time.    Labs reviewed: Recent Labs    09/18/20 0835 03/30/21 0916  NA 140 139  K 4.1 4.1  CL 102 105  CO2 23 28  GLUCOSE 93 94  BUN 7* 12  CREATININE 0.73 0.67  CALCIUM 8.9 9.0   Recent Labs    09/18/20 0835 03/30/21 0916  AST 28 26  ALT 23 33*  ALKPHOS 74  --   BILITOT 0.5 0.6  PROT 6.3 6.3  ALBUMIN 4.2  --    Recent Labs    03/30/21 0916  WBC 4.2  NEUTROABS 1,672  HGB 12.6  HCT 37.0  MCV 94.9  PLT 221   Lab Results  Component Value Date   TSH 0.77 03/30/2021   No results found for: HGBA1C Lab Results  Component Value Date  CHOL 236 (H) 03/30/2021   HDL 84 03/30/2021   LDLCALC 136 (H) 03/30/2021   LDLDIRECT 151 (H) 11/02/2013   TRIG 70 03/30/2021   CHOLHDL 2.8 03/30/2021    Significant Diagnostic Results in last 30 days:  No results found.  Assessment/Plan 1. COVID-19 virus RNA test result positive at limit of detection Temp 100.2 with chills - discussed option to treat with antiviral or continue with supportive care with vitamins and fluids.Prefers to take antiviral.side effects discussed  - advised to go to ED if symptoms worsen  - nirmatrelvir/ritonavir EUA (PAXLOVID) 20 x 150 MG & 10 x 100MG  TABS; Take 3 tablets by mouth 2 (two) times daily for 5 days. (Take nirmatrelvir 150 mg two tablets twice daily for 5 days and ritonavir 100 mg one tablet twice daily for 5 days) Patient GFR is 92  Dispense: 30 tablet; Refill: 0 - Cholecalciferol (VITAMIN D3) 50 MCG (2000 UT) capsule; Take 1 capsule (2,000 Units total) by mouth daily for 14 days.  Dispense: 14 capsule; Refill: 0 - Ascorbic Acid (VITAMIN C) 1000 MG tablet; Take 1 tablet (1,000 mg total) by mouth daily for 14  days.  Dispense: 14 tablet; Refill: 0 - zinc gluconate 50 MG tablet; Take 1 tablet (50 mg total) by mouth daily for 14 days.  Dispense: 14 tablet; Refill: 0  2. Upper respiratory tract infection due to COVID-19 virus - OTC mucinex as needed for cough - nirmatrelvir/ritonavir EUA (PAXLOVID) 20 x 150 MG & 10 x 100MG  TABS; Take 3 tablets by mouth 2 (two) times daily for 5 days. (Take nirmatrelvir 150 mg two tablets twice daily for 5 days and ritonavir 100 mg one tablet twice daily for 5 days) Patient GFR is 92  Dispense: 30 tablet; Refill: 0 - Cholecalciferol (VITAMIN D3) 50 MCG (2000 UT) capsule; Take 1 capsule (2,000 Units total) by mouth daily for 14 days.  Dispense: 14 capsule; Refill: 0 - Ascorbic Acid (VITAMIN C) 1000 MG tablet; Take 1 tablet (1,000 mg total) by mouth daily for 14 days.  Dispense: 14 tablet; Refill: 0 - zinc gluconate 50 MG tablet; Take 1 tablet (50 mg total) by mouth daily for 14 days.  Dispense: 14 tablet; Refill: 0 - Tylenol as needed for fever or body aches  - over the counter Mucinex as needed for cough  - increase your fruits intake in your diet  - increase your water intake to 6-8 glasses of water daily /soup / Herbal tea  - Notify provider or go to ED if you develop any chest tightness,chest pain or shortness of breath    Family/ staff Communication: Reviewed plan of care with patient verbalized understanding   Labs/tests ordered: None   Next Appointment: As needed if symptoms worsen or fail to improve   I connected with  Sheri Adkins on 09/12/21 by a video enabled telemedicine application and verified that I am speaking with the correct person using two identifiers.   I discussed the limitations of evaluation and management by telemedicine. The patient expressed understanding and agreed to proceed.   Spent 13 minutes of non-face to face with patient  >50% time spent counseling; reviewing medical record; tests; labs; and developing future plan of  care.   Sandrea Hughs, NP

## 2021-09-24 ENCOUNTER — Other Ambulatory Visit: Payer: Self-pay | Admitting: Family

## 2021-09-24 DIAGNOSIS — E039 Hypothyroidism, unspecified: Secondary | ICD-10-CM

## 2021-09-28 ENCOUNTER — Encounter: Payer: Self-pay | Admitting: Family

## 2021-09-28 ENCOUNTER — Ambulatory Visit (INDEPENDENT_AMBULATORY_CARE_PROVIDER_SITE_OTHER): Payer: Medicare Other | Admitting: Family

## 2021-09-28 ENCOUNTER — Other Ambulatory Visit: Payer: Self-pay

## 2021-09-28 VITALS — BP 100/60 | HR 82 | Temp 97.3°F | Resp 16 | Ht 63.5 in | Wt 148.4 lb

## 2021-09-28 DIAGNOSIS — Z23 Encounter for immunization: Secondary | ICD-10-CM | POA: Diagnosis not present

## 2021-09-28 DIAGNOSIS — H6122 Impacted cerumen, left ear: Secondary | ICD-10-CM

## 2021-09-28 DIAGNOSIS — Z1211 Encounter for screening for malignant neoplasm of colon: Secondary | ICD-10-CM

## 2021-09-28 DIAGNOSIS — G43809 Other migraine, not intractable, without status migrainosus: Secondary | ICD-10-CM

## 2021-09-28 DIAGNOSIS — F3342 Major depressive disorder, recurrent, in full remission: Secondary | ICD-10-CM | POA: Diagnosis not present

## 2021-09-28 DIAGNOSIS — E782 Mixed hyperlipidemia: Secondary | ICD-10-CM

## 2021-09-28 DIAGNOSIS — R0981 Nasal congestion: Secondary | ICD-10-CM

## 2021-09-28 DIAGNOSIS — E039 Hypothyroidism, unspecified: Secondary | ICD-10-CM

## 2021-09-28 DIAGNOSIS — H9193 Unspecified hearing loss, bilateral: Secondary | ICD-10-CM

## 2021-09-28 MED ORDER — FLUTICASONE PROPIONATE 50 MCG/ACT NA SUSP: 2.0000 | Freq: Every day | NASAL | 6 refills | Status: DC

## 2021-09-28 MED ORDER — SUMATRIPTAN SUCCINATE 25 MG PO TABS: 25.0000 mg | ORAL_TABLET | ORAL | 0 refills | Status: DC | PRN

## 2021-09-28 MED ORDER — TETANUS-DIPHTH-ACELL PERTUSSIS 5-2.5-18.5 LF-MCG/0.5 IM SUSP: 0.5000 mL | Freq: Once | INTRAMUSCULAR | 0 refills | Status: AC

## 2021-09-28 NOTE — Patient Instructions (Signed)
Please get Tetanus vaccine ,Shingles and COVID-19 vaccine at Pharmacy.  ?

## 2021-09-28 NOTE — Progress Notes (Signed)
? ?Provider: Marlowe Sax FNP-C  ? ?Safina Huard, Nelda Bucks, NP ? ?Patient Care Team: ?Queena Monrreal, Nelda Bucks, NP as PCP - General (Family Medicine) ? ?Extended Emergency Contact Information ?Primary Emergency Contact: Welz,Janet ? [place] ?Home Phone: [phone] ?Mobile Phone: [phone] ?Relation: Sister ? ?Code Status:  Full Code  ?Goals of care: Advanced Directive information ?Advanced Directives 09/28/2021  ?Does Patient Have a Medical Advance Directive? No  ?Would patient like information on creating a medical advance directive? No - Patient declined  ? ? ? ?Chief Complaint  ?Patient presents with  ? Medical Management of Chronic Issues  ?  6 month follow up.  ? Lab   ?  Fasting Labs.  ? Health Maintenance  ?  Discuss the need for Colonoscopy.   ? Immunizations  ?  Discuss the need for Covid Booster, Shingrix vaccine, Tetanus vaccine, and Pne vaccine.  ? ? ?HPI:  ?Pt is a 66 y.o. female seen today for 6 months follow-up for medical management of chronic diseases.She has a medical history of acquired hypothyroidism, major depression, nasal congestion, migraine with aura, osteoarthritis among other conditions. ?She requests change of migraine medication Almotriptan to be switched to  another medication. States her insurance is not covering current medication. ? ?She is due for colonoscopy but requests Cologuard to be ordered today. ?Also due for COVID booster Shingrix, tetanus and pneumonia vaccine.  Agrees to get her pneumonia vaccine today.  She is aware to get her COVID Shingrix and will send the tetanus vaccine to her pharmacy. ? ?Past Medical History:  ?Diagnosis Date  ? Allergy   ? Arthritis   ? Depression   ? H/O mammogram 2021  ? History of Papanicolaou smear of cervix 2020  ? Migraine   ? Thyroid disease   ? ?History reviewed. No pertinent surgical history. ? ?Allergies  ?Allergen Reactions  ? Penicillins   ?  Has patient had a PCN reaction causing immediate rash, facial/tongue/throat  swelling, SOB or lightheadedness with hypotension: Yes ?Has patient had a PCN reaction causing severe rash involving mucus membranes or skin necrosis: No ?Has patient had a PCN reaction that required hospitalization No ?Has patient had a PCN reaction occurring within the last 10 years: No ?If all of the above answers are "NO", then may proceed with Cephalosporin use. ?  ? Sulfa Antibiotics   ?  Rash  ? ? ?Allergies as of 09/28/2021   ? ?   Reactions  ? Penicillins   ? Has patient had a PCN reaction causing immediate rash, facial/tongue/throat swelling, SOB or lightheadedness with hypotension: Yes ?Has patient had a PCN reaction causing severe rash involving mucus membranes or skin necrosis: No ?Has patient had a PCN reaction that required hospitalization No ?Has patient had a PCN reaction occurring within the last 10 years: No ?If all of the above answers are "NO", then may proceed with Cephalosporin use.  ? Sulfa Antibiotics   ? Rash  ? ?  ? ?  ?Medication List  ?  ? ?  ? Accurate as of September 28, 2021  9:15 AM. If you have any questions, ask your nurse or doctor.  ?  ?  ? ?  ? ?STOP taking these medications   ? ?almotriptan 6.25 MG tablet ?Commonly known as: AXERT ?Stopped by: Sandrea Hughs, NP ?  ?cetirizine 10 MG tablet ?Commonly known as: ZYRTEC ?Stopped by: Sandrea Hughs, NP ?  ? ?  ? ?TAKE these medications   ? ?IBUPROFEN PO ?  Take 1 tablet by mouth as needed. ?  ?levothyroxine 100 MCG tablet ?Commonly known as: SYNTHROID ?TAKE 1 TABLET DAILY ?  ?MULTIVITAMIN PO ?Take 1 capsule by mouth daily. ?  ?PARoxetine 40 MG tablet ?Commonly known as: PAXIL ?TAKE 1 TABLET DAILY ?  ?TYLENOL PO ?Take 2 capsules by mouth as needed. ?  ? ?  ? ? ?Review of Systems  ?Constitutional:  Negative for appetite change, chills, fatigue, fever and unexpected weight change.  ?HENT:  Negative for congestion, ear discharge, ear pain, hearing loss, nosebleeds, postnasal drip, rhinorrhea, sinus pressure, sinus pain, sneezing, sore  throat, tinnitus and trouble swallowing.   ?     Allergies  ?Eyes:  Positive for visual disturbance. Negative for pain, discharge, redness and itching.  ?     Wears corrective lenses  ?Respiratory:  Negative for cough, chest tightness, shortness of breath and wheezing.   ?Cardiovascular:  Negative for chest pain, palpitations and leg swelling.  ?Gastrointestinal:  Negative for abdominal distention, abdominal pain, blood in stool, constipation, diarrhea, nausea and vomiting.  ?Endocrine: Negative for cold intolerance, heat intolerance, polydipsia, polyphagia and polyuria.  ?Genitourinary:  Negative for difficulty urinating, dysuria, flank pain, frequency and urgency.  ?Musculoskeletal:  Negative for arthralgias, back pain, gait problem, joint swelling, myalgias, neck pain and neck stiffness.  ?Skin:  Negative for color change, pallor, rash and wound.  ?Neurological:  Negative for dizziness, syncope, speech difficulty, weakness, light-headedness, numbness and headaches.  ?Hematological:  Does not bruise/bleed easily.  ?Psychiatric/Behavioral:  Negative for agitation, behavioral problems, confusion, hallucinations, self-injury, sleep disturbance and suicidal ideas. The patient is not nervous/anxious.   ? ?Immunization History  ?Administered Date(s) Administered  ? Influenza Inj Mdck Quad With Preservative 05/13/2018  ? Influenza,inj,Quad PF,6+ Mos 04/14/2017  ? Influenza-Unspecified 04/18/2014, 05/02/2015, 04/23/2020  ? PFIZER(Purple Top)SARS-COV-2 Vaccination 09/23/2019, 10/13/2019, 07/02/2020  ? Tdap 07/16/2011  ? ?Pertinent  Health Maintenance Due  ?Topic Date Due  ? COLONOSCOPY (Pts 45-72yr Insurance coverage will need to be confirmed)  Never done  ? INFLUENZA VACCINE  02/12/2021  ? MAMMOGRAM  11/01/2021  ? PAP SMEAR-Modifier  10/25/2022  ? DEXA SCAN  Completed  ? ?Fall Risk 04/30/2019 10/25/2019 05/02/2020 09/12/2021 09/28/2021  ?Falls in the past year? 0 0 0 0 0  ?Was there an injury with Fall? 0 0 0 0 0  ?Was there  an injury with Fall? - - - - -  ?Fall Risk Category Calculator 0 0 0 0 0  ?Fall Risk Category Low Low Low Low Low  ?Patient Fall Risk Level - Low fall risk Low fall risk Low fall risk Low fall risk  ?Patient at Risk for Falls Due to - - - No Fall Risks No Fall Risks  ?Fall risk Follow up - Falls evaluation completed Falls evaluation completed Falls evaluation completed Falls evaluation completed  ? ?Functional Status Survey: ?  ? ?Vitals:  ? 09/28/21 0859  ?BP: 100/60  ?Pulse: 82  ?Resp: 16  ?Temp: (!) 97.3 ?F (36.3 ?C)  ?SpO2: 98%  ?Weight: 148 lb 6.4 oz (67.3 kg)  ?Height: 5' 3.5" (1.613 m)  ? ?Body mass index is 25.88 kg/m?.Marland Kitchen?Physical Exam ?Vitals reviewed.  ?Constitutional:   ?   General: She is not in acute distress. ?   Appearance: Normal appearance. She is overweight. She is not ill-appearing or diaphoretic.  ?HENT:  ?   Head: Normocephalic.  ?   Right Ear: Tympanic membrane, ear canal and external ear normal. There is no impacted cerumen.  ?  Left Ear: There is impacted cerumen.  ?   Nose: Nose normal. No congestion or rhinorrhea.  ?   Mouth/Throat:  ?   Mouth: Mucous membranes are moist.  ?   Pharynx: Oropharynx is clear. No oropharyngeal exudate or posterior oropharyngeal erythema.  ?Eyes:  ?   General: No scleral icterus.    ?   Right eye: No discharge.     ?   Left eye: No discharge.  ?   Extraocular Movements: Extraocular movements intact.  ?   Conjunctiva/sclera: Conjunctivae normal.  ?   Pupils: Pupils are equal, round, and reactive to light.  ?Neck:  ?   Vascular: No carotid bruit.  ?Cardiovascular:  ?   Rate and Rhythm: Normal rate and regular rhythm.  ?   Pulses: Normal pulses.  ?   Heart sounds: Normal heart sounds. No murmur heard. ?  No friction rub. No gallop.  ?Pulmonary:  ?   Effort: Pulmonary effort is normal. No respiratory distress.  ?   Breath sounds: Normal breath sounds. No wheezing, rhonchi or rales.  ?Chest:  ?   Chest wall: No tenderness.  ?Abdominal:  ?   General: Bowel sounds are  normal. There is no distension.  ?   Palpations: Abdomen is soft. There is no mass.  ?   Tenderness: There is no abdominal tenderness. There is no right CVA tenderness, left CVA tenderness, guarding or rebound.

## 2021-09-29 LAB — COMPLETE METABOLIC PANEL WITH GFR
AG Ratio: 2 (calc) (ref 1.0–2.5)
ALT: 26 U/L (ref 6–29)
AST: 24 U/L (ref 10–35)
Albumin: 4.2 g/dL (ref 3.6–5.1)
Alkaline phosphatase (APISO): 80 U/L (ref 37–153)
BUN: 8 mg/dL (ref 7–25)
CO2: 27 mmol/L (ref 20–32)
Calcium: 9.3 mg/dL (ref 8.6–10.4)
Chloride: 103 mmol/L (ref 98–110)
Creat: 0.61 mg/dL (ref 0.50–1.05)
Globulin: 2.1 g/dL (calc) (ref 1.9–3.7)
Glucose, Bld: 91 mg/dL (ref 65–99)
Potassium: 4.3 mmol/L (ref 3.5–5.3)
Sodium: 139 mmol/L (ref 135–146)
Total Bilirubin: 0.6 mg/dL (ref 0.2–1.2)
Total Protein: 6.3 g/dL (ref 6.1–8.1)
eGFR: 99 mL/min/{1.73_m2} (ref >=60)

## 2021-09-29 LAB — CBC WITH DIFFERENTIAL/PLATELET
Absolute Monocytes: 559 cells/uL (ref 200–950)
Basophils Absolute: 52 cells/uL (ref 0–200)
Basophils Relative: 1.1 %
Eosinophils Absolute: 122 cells/uL (ref 15–500)
Eosinophils Relative: 2.6 %
HCT: 39.5 % (ref 35.0–45.0)
Hemoglobin: 13.3 g/dL (ref 11.7–15.5)
Lymphs Abs: 1904 cells/uL (ref 850–3900)
MCH: 31.1 pg (ref 27.0–33.0)
MCHC: 33.7 g/dL (ref 32.0–36.0)
MCV: 92.3 fL (ref 80.0–100.0)
MPV: 10.3 fL (ref 7.5–12.5)
Monocytes Relative: 11.9 %
Neutro Abs: 2063 cells/uL (ref 1500–7800)
Neutrophils Relative %: 43.9 %
Platelets: 299 10*3/uL (ref 140–400)
RBC: 4.28 10*6/uL (ref 3.80–5.10)
RDW: 12.2 % (ref 11.0–15.0)
Total Lymphocyte: 40.5 %
WBC: 4.7 10*3/uL (ref 3.8–10.8)

## 2021-09-29 LAB — LIPID PANEL
Cholesterol: 203 mg/dL — ABNORMAL HIGH (ref <200)
HDL: 67 mg/dL (ref >=50)
LDL Cholesterol (Calc): 114 mg/dL (calc) — ABNORMAL HIGH
Non-HDL Cholesterol (Calc): 136 mg/dL (calc) — ABNORMAL HIGH (ref <130)
Total CHOL/HDL Ratio: 3 (calc) (ref <5.0)
Triglycerides: 109 mg/dL (ref <150)

## 2021-09-29 LAB — TSH: TSH: 0.04 mIU/L — ABNORMAL LOW (ref 0.40–4.50)

## 2021-10-01 DIAGNOSIS — F3342 Major depressive disorder, recurrent, in full remission: Secondary | ICD-10-CM

## 2021-10-01 NOTE — Telephone Encounter (Signed)
Pended Paxil. Sent to [facility] to address the Levothyroxine due to recent labs.  ?Please Advise.  ?

## 2021-10-02 ENCOUNTER — Other Ambulatory Visit: Payer: Self-pay

## 2021-10-02 DIAGNOSIS — E039 Hypothyroidism, unspecified: Secondary | ICD-10-CM

## 2021-10-02 MED ORDER — PAROXETINE HCL 40 MG PO TABS: 40.0000 mg | ORAL_TABLET | Freq: Every day | ORAL | 1 refills | Status: DC

## 2021-10-02 MED ORDER — LEVOTHYROXINE SODIUM 88 MCG PO TABS: 88.0000 ug | ORAL_TABLET | Freq: Every day | ORAL | 1 refills | Status: DC

## 2021-10-02 NOTE — Telephone Encounter (Signed)
See lab results for levothyroxine orders.  ?

## 2021-10-02 NOTE — Addendum Note (Signed)
Addended by: Logan Bores on: 10/02/2021 03:15 PM ? ? Modules accepted: Orders ? ?

## 2021-10-29 ENCOUNTER — Ambulatory Visit (INDEPENDENT_AMBULATORY_CARE_PROVIDER_SITE_OTHER): Payer: Medicare Other | Admitting: Family

## 2021-10-29 ENCOUNTER — Encounter: Payer: Self-pay | Admitting: Family

## 2021-10-29 ENCOUNTER — Other Ambulatory Visit: Payer: Self-pay

## 2021-10-29 VITALS — BP 138/80 | HR 79 | Temp 97.7°F | Resp 16 | Ht 63.5 in | Wt 149.2 lb

## 2021-10-29 DIAGNOSIS — W5501XA Bitten by cat, initial encounter: Secondary | ICD-10-CM

## 2021-10-29 DIAGNOSIS — L03113 Cellulitis of right upper limb: Secondary | ICD-10-CM

## 2021-10-29 MED ORDER — DOXYCYCLINE HYCLATE 100 MG PO TABS: 100.0000 mg | ORAL_TABLET | Freq: Two times a day (BID) | ORAL | 0 refills | Status: AC

## 2021-10-29 MED ORDER — DOXYCYCLINE HYCLATE 100 MG PO CAPS: 100.0000 mg | ORAL_CAPSULE | Freq: Two times a day (BID) | ORAL | 0 refills | Status: DC

## 2021-10-29 MED ORDER — DOXYCYCLINE HYCLATE 100 MG PO TABS: 100.0000 mg | ORAL_TABLET | Freq: Two times a day (BID) | ORAL | 0 refills | Status: DC

## 2021-10-29 NOTE — Telephone Encounter (Signed)
Patient called and stated that the Doxycycline Tablets are going to cost $63.00 but the pharmacy stated that the Doxycycline Capsules would be a lot cheaper.  ? ?Patient is requesting the Capsules to be sent to [facility] instead.  ? ? ?Pended Rx and sent to [facility] for approval.  ? ?

## 2021-10-29 NOTE — Patient Instructions (Signed)
Animal Bite, Adult Animal bites range from mild to serious. An animal bite can result in any of these injuries: A scratch. A deep, open cut. Broken (punctured) or torn skin. A crush injury. A bone injury. A small bite from a house pet is usually less serious than a bite from a stray or wild animal. Cat bites can be more serious because their long, thin teeth can cause deep puncture wounds that close fast, trapping bacteria inside. Stray or wild animals, such as a raccoon, fox, skunk, or bat, are at higher risk of carrying a serious infection called rabies, which they can pass to a human through a bite. A bite from one of these animals needs medical care right away and, sometimes, rabies vaccination. What increases the risk? You are more likely to be bitten by an animal if: You are around unfamiliar pets. You disturb an animal when it is eating, sleeping, or caring for its babies. You are outdoors in a place where small, wild animals roam freely. What are the signs or symptoms? Common symptoms of an animal bite include: Pain. Bleeding. Swelling. Bruising. How is this diagnosed? This condition may be diagnosed based on a physical exam and medical history. Your health care provider will examine your wound and ask for details about the animal and how the bite happened. You may also have tests, such as: Blood tests to check for infection. X-rays to check for damage to bones or joints. Taking a fluid sample from your wound and checking it for infection (culture test). How is this treated? Treatment depends on the type of animal, where the bite is on your body, and your medical history. Treatment may include: Wound care. This often includes cleaning the wound and rinsing it out (flushing it) with saline solution, which is made of salt and water. A bandage (dressing) is also often applied. In rare cases, the wound may be closed with stitches (sutures), staples, skin glue, or adhesive  strips. Antibiotic medicine to prevent or treat infection. This medicine may be prescribed in pill or ointment form. If the bite area gets infected, the medicine may be given through an IV. A tetanus shot to prevent tetanus infection. Rabies treatment to prevent rabies infection, if the animal could have rabies. Surgery. This may be done if a bite gets infected or causes damage that needs to be repaired. Follow these instructions at home: Medicines Take or apply over-the-counter and prescription medicines only as told by your health care provider. If you were prescribed an antibiotic medicine, take or apply it as told by your health care provider. Do not stop using the antibiotic even if you start to feel better. Wound care  Follow instructions from your health care provider about how to take care of your wound. Make sure you: Wash your hands with soap and water for at least 20 seconds before and after you change your dressing. If soap and water are not available, use hand sanitizer. Change your dressing as told by your health care provider. Leave sutures, skin glue, or adhesive strips in place. These skin closures may need to stay in place for 2 weeks or longer. If adhesive strip edges start to loosen and curl up, you may trim the loose edges. Do not remove adhesive strips completely unless your health care provider tells you to do that. Check your wound every day for signs of infection. Check for: More redness, swelling, or pain. More fluid or blood. Warmth. Pus or a bad smell. General   instructions  Raise (elevate) the injured area above the level of your heart while you are sitting or lying down, if this is possible. If directed, put ice on the injured area. To do this: Put ice in a plastic bag. Place a towel between your skin and the bag. Leave the ice on for 20 minutes, 2-3 times per day. Remove the ice if your skin turns bright red. This is very important. If you cannot feel pain,  heat, or cold, you have a greater risk of damage to the area. Keep all follow-up visits. This is important. Contact a health care provider if: You have more redness, swelling, or pain around your wound. Your wound feels warm to the touch. You have a fever or chills. You have a general feeling of sickness (malaise). You feel nauseous or you vomit. You have pain that does not get better. Get help right away if: You have a red streak that leads away from your wound. You have non-clear fluid or more blood coming from your wound. There is pus or a bad smell coming from your wound. You have trouble moving your injured area. You have numbness or tingling that spreads beyond your wound. Summary Animal bites can range from mild to serious. An animal bite can cause a scratch on the skin, a deep and open cut, torn or punctured skin, a crush injury, or a bone injury. A bite from a stray or wild animal needs medical care right away and, sometimes, rabies vaccination. Your health care provider will examine your wound and ask for details about the animal and how the bite happened. Treatment may include wound care, antibiotic medicine, a tetanus shot, and rabies treatment if the animal could have rabies. This information is not intended to replace advice given to you by your health care provider. Make sure you discuss any questions you have with your health care provider. Document Revised: 07/06/2021 Document Reviewed: 07/06/2021 Elsevier Patient Education  2023 Elsevier Inc.  

## 2021-10-29 NOTE — Progress Notes (Signed)
? ?Provider: Marlowe Sax FNP-C ? ?Sarra Rachels, Nelda Bucks, NP ? ?Patient Care Team: ?Fujiko Picazo, Nelda Bucks, NP as PCP - General (Family Medicine) ? ?Extended Emergency Contact Information ?Primary Emergency Contact: Watters,Janet ? [place] ?Home Phone: [phone] ?Mobile Phone: [phone] ?Relation: Sister ? ?Code Status: Full Code  ?Goals of care: Advanced Directive information ? ?  10/29/2021  ?  1:07 PM  ?Advanced Directives  ?Does Patient Have a Medical Advance Directive? No  ?Would patient like information on creating a medical advance directive? No - Patient declined  ? ? ? ?Chief Complaint  ?Patient presents with  ? Acute Visit  ?  Patient complains of cat bite on right hand 10/26/2021.  ? ? ?HPI:  ?Pt is a 66 y.o. female seen today for an acute visit for evaluation of right hand cat bite 3 days ago.state was petting her cat when it bit her.Her cat was in bed but lights were off when she got bit.she washed her hand with soap and water then cleansed bit site with peroxide.Hand was swollen ,hot and redness then following day. She denies any fever or chills or drainage from bite site.She recently received her tetanus shot at the pharmacy. ? ? ?Past Medical History:  ?Diagnosis Date  ? Allergy   ? Arthritis   ? Depression   ? H/O mammogram 2021  ? History of Papanicolaou smear of cervix 2020  ? Migraine   ? Thyroid disease   ? ?History reviewed. No pertinent surgical history. ? ?Allergies  ?Allergen Reactions  ? Penicillins   ?  Has patient had a PCN reaction causing immediate rash, facial/tongue/throat swelling, SOB or lightheadedness with hypotension: Yes ?Has patient had a PCN reaction causing severe rash involving mucus membranes or skin necrosis: No ?Has patient had a PCN reaction that required hospitalization No ?Has patient had a PCN reaction occurring within the last 10 years: No ?If all of the above answers are "NO", then may proceed with Cephalosporin use. ?  ? Sulfa Antibiotics   ?  Rash   ? ? ?Outpatient Encounter Medications as of 10/29/2021  ?Medication Sig  ? Acetaminophen (TYLENOL PO) Take 2 capsules by mouth as needed.  ? fluticasone (FLONASE) 50 MCG/ACT nasal spray Place 2 sprays into both nostrils daily.  ? IBUPROFEN PO Take 1 tablet by mouth as needed.  ? levothyroxine (SYNTHROID) 88 MCG tablet Take 1 tablet (88 mcg total) by mouth daily.  ? Multiple Vitamin (MULTIVITAMIN PO) Take 1 capsule by mouth daily.  ? PARoxetine (PAXIL) 40 MG tablet Take 1 tablet (40 mg total) by mouth daily.  ? SUMAtriptan (IMITREX) 25 MG tablet Take 1 tablet (25 mg total) by mouth every 2 (two) hours as needed for migraine. May repeat in 2 hours if headache persists or recurs.  ? ?No facility-administered encounter medications on file as of 10/29/2021.  ? ? ?Review of Systems  ?Constitutional:  Negative for chills, fatigue and fever.  ?Respiratory:  Negative for cough, chest tightness, shortness of breath and wheezing.   ?Cardiovascular:  Negative for chest pain, palpitations and leg swelling.  ?Gastrointestinal:  Negative for abdominal distention, nausea and vomiting.  ?Musculoskeletal:  Negative for arthralgias, back pain, gait problem, joint swelling and myalgias.  ?Skin:  Negative for color change, pallor and rash.  ?     Cat bite on right hand,redness   ?Neurological:  Negative for dizziness, speech difficulty, weakness, numbness and headaches.  ? ?Immunization History  ?Administered Date(s) Administered  ? Influenza Inj Mdck  Quad With Preservative 05/13/2018  ? Influenza,inj,Quad PF,6+ Mos 04/14/2017  ? Influenza-Unspecified 04/18/2014, 05/02/2015, 04/23/2020  ? PFIZER(Purple Top)SARS-COV-2 Vaccination 09/23/2019, 10/13/2019, 07/02/2020  ? Pneumococcal Conjugate-13 09/28/2021  ? Tdap 07/16/2011  ? ?Pertinent  Health Maintenance Due  ?Topic Date Due  ? COLONOSCOPY (Pts 45-34yr Insurance coverage will need to be confirmed)  Never done  ? MAMMOGRAM  11/01/2021  ? INFLUENZA VACCINE  02/12/2022  ? PAP  SMEAR-Modifier  10/25/2022  ? DEXA SCAN  Completed  ? ? ?  10/25/2019  ? 10:14 AM 05/02/2020  ?  8:11 AM 09/12/2021  ?  3:30 PM 09/28/2021  ?  8:45 AM 10/29/2021  ?  1:07 PM  ?Fall Risk  ?Falls in the past year? 0 0 0 0 0  ?Was there an injury with Fall? 0 0 0 0 0  ?Fall Risk Category Calculator 0 0 0 0 0  ?Fall Risk Category Low Low Low Low Low  ?Patient Fall Risk Level Low fall risk Low fall risk Low fall risk Low fall risk Low fall risk  ?Patient at Risk for Falls Due to   No Fall Risks No Fall Risks No Fall Risks  ?Fall risk Follow up Falls evaluation completed Falls evaluation completed Falls evaluation completed Falls evaluation completed Falls evaluation completed  ? ?Functional Status Survey: ?  ? ?Vitals:  ? 10/29/21 1259  ?BP: 138/80  ?Pulse: 79  ?Resp: 16  ?Temp: 97.7 ?F (36.5 ?C)  ?SpO2: 96%  ?Weight: 149 lb 3.2 oz (67.7 kg)  ?Height: 5' 3.5" (1.613 m)  ? ?Body mass index is 26.02 kg/m?.Marland Kitchen?Physical Exam ?Vitals reviewed.  ?Constitutional:   ?   General: She is not in acute distress. ?   Appearance: Normal appearance. She is normal weight. She is not ill-appearing or diaphoretic.  ?HENT:  ?   Head: Normocephalic.  ?   Left Ear: Tympanic membrane normal.  ?Eyes:  ?   General: No scleral icterus.    ?   Right eye: No discharge.     ?   Left eye: No discharge.  ?   Conjunctiva/sclera: Conjunctivae normal.  ?   Pupils: Pupils are equal, round, and reactive to light.  ?Cardiovascular:  ?   Rate and Rhythm: Normal rate and regular rhythm.  ?   Pulses: Normal pulses.  ?   Heart sounds: Normal heart sounds. No murmur heard. ?  No friction rub. No gallop.  ?Pulmonary:  ?   Effort: Pulmonary effort is normal. No respiratory distress.  ?   Breath sounds: Normal breath sounds. No wheezing, rhonchi or rales.  ?Chest:  ?   Chest wall: No tenderness.  ?Skin: ?   General: Skin is warm and dry.  ?   Coloration: Skin is not pale.  ?   Findings: No erythema or rash.  ?   Comments: Right dorsal hand at the base of ring finger  small scab noted tender to touch.  Surrounding skin tissue with diffuse erythema warm to touch.No drainage noted  ?Neurological:  ?   Mental Status: She is alert and oriented to person, place, and time.  ?   Motor: No weakness.  ?   Gait: Gait normal.  ?Psychiatric:     ?   Mood and Affect: Mood normal.     ?   Speech: Speech normal.     ?   Behavior: Behavior normal.  ? ? ?Labs reviewed: ?Recent Labs  ?  03/30/21 ?0916 09/28/21 ?0952  ?NA 139 139  ?  K 4.1 4.3  ?CL 105 103  ?CO2 28 27  ?GLUCOSE 94 91  ?BUN 12 8  ?CREATININE 0.67 0.61  ?CALCIUM 9.0 9.3  ? ?Recent Labs  ?  03/30/21 ?0916 09/28/21 ?0952  ?AST 26 24  ?ALT 33* 26  ?BILITOT 0.6 0.6  ?PROT 6.3 6.3  ? ?Recent Labs  ?  03/30/21 ?0916 09/28/21 ?0952  ?WBC 4.2 4.7  ?NEUTROABS [id number] 2,063  ?HGB 12.6 13.3  ?HCT 37.0 39.5  ?MCV 94.9 92.3  ?PLT 221 299  ? ?Lab Results  ?Component Value Date  ? TSH 0.04 (L) 09/28/2021  ? ?No results found for: HGBA1C ?Lab Results  ?Component Value Date  ? CHOL 203 (H) 09/28/2021  ? HDL 67 09/28/2021  ? LDLCALC 114 (H) 09/28/2021  ? LDLDIRECT 151 (H) 11/02/2013  ? TRIG 109 09/28/2021  ? CHOLHDL 3.0 09/28/2021  ? ? ?Significant Diagnostic Results in last 30 days:  ?No results found. ? ?Assessment/Plan ? ?1. Cat bite, initial encounter ?Her cat bit her on right hand while she was petting the cat on the bed with lights off.  ?Had Tetanus vaccine few weeks ago at her pharmacy.Message send to [place] to obtain tetanus information from the pharmacy.  ? ?2. Cellulitis of hand, right ?Afebrile.Right dorsal hand at the base of ring finger small scab noted tender to touch.Surrounding skin tissue with diffuse erythema warm to touch.No drainage noted ?Start on doxycycline 100 mg tablet 1 by mouth twice daily x10 days ?-Side effects discussed recommended probiotic but states eats yogurt every day.. ?-Advised to no notify provider if symptoms worsen or fail to improve ? ?Family/ staff Communication: Reviewed plan of care with patient verbalized  understanding ? ?Labs/tests ordered: None  ? ?Next Appointment: As needed if symptoms worsen or fail to improve ? ?Sandrea Hughs, NP ?

## 2021-10-29 NOTE — Addendum Note (Signed)
Addended by: Rafael Bihari A on: 10/29/2021 04:54 PM ? ? Modules accepted: Orders ? ?

## 2021-10-29 NOTE — Telephone Encounter (Signed)
Patient called stating the doxycycline was sent to the wrong pharmacy. [facility] [address]  ? ?Called [facility], left voicemail to cancel Doxycycline.  ?

## 2021-10-30 ENCOUNTER — Telehealth: Payer: Self-pay

## 2021-10-30 NOTE — Telephone Encounter (Signed)
-----   Message from Sandrea Hughs, NP sent at 10/29/2021  4:54 PM EDT ----- ?Regarding: Tetanus vaccine ?Hi Sheri Adkins, ?Please call patient's pharmacy tomorrow  to obtain tetanus vaccine.she recently had vaccine at her pharmacy. ?Thanks  ?Dinah Ngetich,FNP-C  ? ?

## 2021-10-30 NOTE — Telephone Encounter (Signed)
Please contact patient's pharmacy for Tetanus vaccine information.  ?

## 2021-10-30 NOTE — Telephone Encounter (Signed)
I called patient and left her a voicemail to obtain vaccine information and utilize MyChart by sending message through portal to have immunization record updated. Patient also made aware that she could call office with vaccine dates and have immunization record updated as well.  ? ?

## 2021-11-07 NOTE — Telephone Encounter (Signed)
Tdap vaccine documented by Chrae Beatty/CMA. I called patient and confirmed that date was correct. Patient confirmed.   ?

## 2021-11-21 ENCOUNTER — Other Ambulatory Visit: Payer: Self-pay

## 2021-11-22 ENCOUNTER — Telehealth: Payer: Self-pay

## 2021-11-22 ENCOUNTER — Ambulatory Visit (INDEPENDENT_AMBULATORY_CARE_PROVIDER_SITE_OTHER): Payer: Medicare Other | Admitting: Orthopedic Surgery

## 2021-11-22 ENCOUNTER — Encounter: Payer: Self-pay | Admitting: Orthopedic Surgery

## 2021-11-22 ENCOUNTER — Other Ambulatory Visit: Payer: Self-pay | Admitting: Orthopedic Surgery

## 2021-11-22 VITALS — BP 116/70 | HR 78 | Temp 97.2°F | Ht 63.5 in | Wt 149.1 lb

## 2021-11-22 DIAGNOSIS — S70362A Insect bite (nonvenomous), left thigh, initial encounter: Secondary | ICD-10-CM | POA: Diagnosis not present

## 2021-11-22 DIAGNOSIS — W57XXXA Bitten or stung by nonvenomous insect and other nonvenomous arthropods, initial encounter: Secondary | ICD-10-CM

## 2021-11-22 MED ORDER — DOXYCYCLINE HYCLATE 100 MG PO CAPS: 100.0000 mg | ORAL_CAPSULE | Freq: Two times a day (BID) | ORAL | 0 refills | Status: AC

## 2021-11-22 MED ORDER — DOXYCYCLINE HYCLATE 100 MG PO TABS: 100.0000 mg | ORAL_TABLET | Freq: Two times a day (BID) | ORAL | 0 refills | Status: DC

## 2021-11-22 NOTE — Patient Instructions (Signed)
Will start doxycycline today for prevention ? ? ?

## 2021-11-22 NOTE — Progress Notes (Signed)
? ? ?Careteam: ?Patient Care Team: ?Ngetich, Nelda Bucks, NP as PCP - General (Family Medicine) ? ?Seen by: Windell Moulding, AGNP-C ? ?PLACE OF SERVICE:  ?[facility]  ?Advanced Directive information ?Does Patient Have a Medical Advance Directive?: No, Would patient like information on creating a medical advance directive?: No - Patient declined ? ?Allergies  ?Allergen Reactions  ? Penicillins   ?  Has patient had a PCN reaction causing immediate rash, facial/tongue/throat swelling, SOB or lightheadedness with hypotension: Yes ?Has patient had a PCN reaction causing severe rash involving mucus membranes or skin necrosis: No ?Has patient had a PCN reaction that required hospitalization No ?Has patient had a PCN reaction occurring within the last 10 years: No ?If all of the above answers are "NO", then may proceed with Cephalosporin use. ?  ? Sulfa Antibiotics   ?  Rash  ? ? ?Chief Complaint  ?Patient presents with  ? Acute Visit  ?  Patient comes in today for a tick bite. Patient stated that she got the bite today while on a walk in the woods. Patient stated that she is experiencing no itching or pain in the location of bite.   ? ? ? ?HPI: Patient is a 66 y.o. female seen today for acute visit due to tick bite.  ? ?She was walking in the wood this morning and a tick latched to her left inner thigh. She removed tick with her fingers. Reports head and body were attached. She has a pea- sized red lesion where she was bit. Non tender. Denies fatigue, headaches, fever, and joint pain. 04/17 she was seen by PCP for right hand cat bite. She was given doxycycline, completed a few days ago. Treatment options discussed, will start doxycyline.  ? ?Review of Systems:  ?Review of Systems  ?Constitutional:  Negative for chills, fever, malaise/fatigue and weight loss.  ?Respiratory:  Negative for cough, shortness of breath and wheezing.   ?Cardiovascular:  Negative for chest pain and leg swelling.  ?Musculoskeletal:  Negative for joint pain  and myalgias.  ?Psychiatric/Behavioral:  Negative for depression. The patient is not nervous/anxious.   ? ?Past Medical History:  ?Diagnosis Date  ? Allergy   ? Arthritis   ? Depression   ? H/O mammogram 2021  ? History of Papanicolaou smear of cervix 2020  ? Migraine   ? Thyroid disease   ? ?History reviewed. No pertinent surgical history. ?Social History: ?  reports that she has never smoked. She has never used smokeless tobacco. She reports current alcohol use of about 3.0 standard drinks per week. She reports that she does not use drugs. ? ?Family History  ?Problem Relation Age of Onset  ? Hypertension Mother   ? Stroke Mother   ? Mental illness Mother   ? Cancer Sister   ?     ovarian cancer  ? Mental illness Sister   ? Heart disease Paternal Grandfather   ? Mental illness Brother   ? Cancer Maternal Grandmother   ? Mental illness Maternal Grandmother   ? Mental illness Brother   ? Mental illness Sister   ? ? ?Medications: ?Patient's Medications  ?New Prescriptions  ? No medications on file  ?Previous Medications  ? ACETAMINOPHEN (TYLENOL PO)    Take 2 capsules by mouth as needed.  ? DOXYCYCLINE (VIBRAMYCIN) 100 MG CAPSULE    Take 1 capsule (100 mg total) by mouth 2 (two) times daily. For 10 days.  ? FLUTICASONE (FLONASE) 50 MCG/ACT NASAL SPRAY  Place 2 sprays into both nostrils daily.  ? IBUPROFEN PO    Take 1 tablet by mouth as needed.  ? LEVOTHYROXINE (SYNTHROID) 88 MCG TABLET    Take 1 tablet (88 mcg total) by mouth daily.  ? MULTIPLE VITAMIN (MULTIVITAMIN PO)    Take 1 capsule by mouth daily.  ? PAROXETINE (PAXIL) 40 MG TABLET    Take 1 tablet (40 mg total) by mouth daily.  ? SUMATRIPTAN (IMITREX) 25 MG TABLET    Take 1 tablet (25 mg total) by mouth every 2 (two) hours as needed for migraine. May repeat in 2 hours if headache persists or recurs.  ?Modified Medications  ? No medications on file  ?Discontinued Medications  ? No medications on file  ? ? ?Physical Exam: ? ?Vitals:  ? 11/22/21 1417  ?BP:  116/70  ?Pulse: 78  ?Temp: (!) 97.2 ?F (36.2 ?C)  ?TempSrc: Temporal  ?SpO2: 98%  ?Weight: 149 lb 1.6 oz (67.6 kg)  ?Height: 5' 3.5" (1.613 m)  ? ?Body mass index is 26 kg/m?. ?Wt Readings from Last 3 Encounters:  ?11/22/21 149 lb 1.6 oz (67.6 kg)  ?10/29/21 149 lb 3.2 oz (67.7 kg)  ?09/28/21 148 lb 6.4 oz (67.3 kg)  ? ? ?Physical Exam ?Vitals reviewed.  ?Constitutional:   ?   General: She is not in acute distress. ?HENT:  ?   Head: Normocephalic.  ?Cardiovascular:  ?   Rate and Rhythm: Normal rate and regular rhythm.  ?   Pulses: Normal pulses.  ?   Heart sounds: Normal heart sounds.  ?Pulmonary:  ?   Effort: Pulmonary effort is normal. No respiratory distress.  ?   Breath sounds: Normal breath sounds. No wheezing.  ?Musculoskeletal:  ?   Right lower leg: No edema.  ?   Left lower leg: No edema.  ?Skin: ?   General: Skin is warm and dry.  ?   Capillary Refill: Capillary refill takes less than 2 seconds.  ?   Comments: Pea sized lesion to left upper thigh, red, nontender, surrounding skin intact.   ?Neurological:  ?   General: No focal deficit present.  ?   Mental Status: She is alert and oriented to person, place, and time.  ?Psychiatric:     ?   Mood and Affect: Mood normal.  ? ? ?Labs reviewed: ?Basic Metabolic Panel: ?Recent Labs  ?  03/30/21 ?0916 09/28/21 ?2563  ?NA 139 139  ?K 4.1 4.3  ?CL 105 103  ?CO2 28 27  ?GLUCOSE 94 91  ?BUN 12 8  ?CREATININE 0.67 0.61  ?CALCIUM 9.0 9.3  ?TSH 0.77 0.04*  ? ?Liver Function Tests: ?Recent Labs  ?  03/30/21 ?0916 09/28/21 ?0952  ?AST 26 24  ?ALT 33* 26  ?BILITOT 0.6 0.6  ?PROT 6.3 6.3  ? ?No results for input(s): LIPASE, AMYLASE in the last 8760 hours. ?No results for input(s): AMMONIA in the last 8760 hours. ?CBC: ?Recent Labs  ?  03/30/21 ?0916 09/28/21 ?0952  ?WBC 4.2 4.7  ?NEUTROABS [id number] 2,063  ?HGB 12.6 13.3  ?HCT 37.0 39.5  ?MCV 94.9 92.3  ?PLT 221 299  ? ?Lipid Panel: ?Recent Labs  ?  03/30/21 ?0916 09/28/21 ?0952  ?CHOL 236* 203*  ?HDL 84 67  ?LDLCALC 136* 114*   ?TRIG 70 109  ?CHOLHDL 2.8 3.0  ? ?TSH: ?Recent Labs  ?  03/30/21 ?0916 09/28/21 ?0952  ?TSH 0.77 0.04*  ? ?A1C: ?No results found for: HGBA1C ? ? ?Assessment/Plan ?1. Tick  bite of left thigh, initial encounter ?- tick bite earlier today while walking in woods ?- she removed with fingers ?- pea sized, red lesion, non tender ?- will start doxycycline prophylaxis ?- doxycycline (VIBRA-TABS) 100 MG tablet; Take 1 tablet (100 mg total) by mouth 2 (two) times daily for 14 days.  Dispense: 28 tablet; Refill: 0 ? ?Total time: 15. Greater than 50% of total time spent doing patient education regarding tick bite prophylaxis and tick bite prevention.  ? ?Next appt: none ?[place], AGNP ? ?[facility] ?[phone]  ?

## 2021-11-22 NOTE — Telephone Encounter (Signed)
Patient is requesting different antibiotic states she is at the [facility] on [address] and they want to charge her 89.00 out of pocket. ?She said you have given a different one in the past that was only $19 and would like to get that or the cheapest one available. Please advise ASAP patient is waiting at Pharmacy. MLP ?

## 2021-11-22 NOTE — Telephone Encounter (Signed)
Dinah gave her this exact medication 1 month ago for cat bite. Unfortunately this medication is the gold standard drug for tick bites. Can she look up Good RX for coupon or ask pharmacy what she paid one month ago and why such a big price difference?

## 2021-11-23 NOTE — Telephone Encounter (Signed)
Late entry, per verbal conversation with Amy medication was changed from tablet to capsule  ?

## 2021-12-11 ENCOUNTER — Other Ambulatory Visit: Payer: Medicare Other

## 2021-12-11 DIAGNOSIS — E039 Hypothyroidism, unspecified: Secondary | ICD-10-CM

## 2021-12-12 LAB — TSH: TSH: 0.15 mIU/L — ABNORMAL LOW (ref 0.40–4.50)

## 2021-12-14 ENCOUNTER — Other Ambulatory Visit: Payer: Self-pay

## 2021-12-14 DIAGNOSIS — E039 Hypothyroidism, unspecified: Secondary | ICD-10-CM

## 2021-12-14 MED ORDER — LEVOTHYROXINE SODIUM 75 MCG PO TABS
75.0000 ug | ORAL_TABLET | Freq: Every day | ORAL | 0 refills | Status: DC
Start: 2021-12-14 — End: 2022-02-13

## 2022-02-08 ENCOUNTER — Other Ambulatory Visit: Payer: Medicare Other

## 2022-02-08 DIAGNOSIS — E039 Hypothyroidism, unspecified: Secondary | ICD-10-CM

## 2022-02-09 LAB — TSH: TSH: 0.2 mIU/L — ABNORMAL LOW (ref 0.40–4.50)

## 2022-02-13 MED ORDER — LEVOTHYROXINE SODIUM 50 MCG PO TABS: 50.0000 ug | ORAL_TABLET | Freq: Every day | ORAL | 3 refills | Status: DC

## 2022-02-15 ENCOUNTER — Other Ambulatory Visit: Payer: Self-pay

## 2022-02-15 DIAGNOSIS — E039 Hypothyroidism, unspecified: Secondary | ICD-10-CM

## 2022-02-15 NOTE — Telephone Encounter (Signed)
Opened in error

## 2022-02-18 DIAGNOSIS — E039 Hypothyroidism, unspecified: Secondary | ICD-10-CM

## 2022-02-18 NOTE — Progress Notes (Signed)
This encounter was created in error - please disregard.

## 2022-03-14 ENCOUNTER — Other Ambulatory Visit: Payer: Self-pay | Admitting: Family

## 2022-04-01 ENCOUNTER — Ambulatory Visit (INDEPENDENT_AMBULATORY_CARE_PROVIDER_SITE_OTHER): Payer: Medicare Other | Admitting: Family

## 2022-04-01 ENCOUNTER — Encounter: Payer: Self-pay | Admitting: Family

## 2022-04-01 VITALS — BP 136/76 | HR 73 | Temp 97.2°F | Resp 16 | Ht 63.5 in | Wt 145.6 lb

## 2022-04-01 DIAGNOSIS — E782 Mixed hyperlipidemia: Secondary | ICD-10-CM | POA: Diagnosis not present

## 2022-04-01 DIAGNOSIS — E039 Hypothyroidism, unspecified: Secondary | ICD-10-CM | POA: Diagnosis not present

## 2022-04-01 DIAGNOSIS — G43809 Other migraine, not intractable, without status migrainosus: Secondary | ICD-10-CM | POA: Diagnosis not present

## 2022-04-01 DIAGNOSIS — Z23 Encounter for immunization: Secondary | ICD-10-CM | POA: Diagnosis not present

## 2022-04-01 NOTE — Patient Instructions (Signed)
Please call [facility] [phone] for left ear wax removal.

## 2022-04-01 NOTE — Progress Notes (Signed)
Provider: Marlowe Sax FNP-C   Lakisha Peyser, Nelda Bucks, NP  Patient Care Team: Laquiesha Piacente, Nelda Bucks, NP as PCP - General (Family Medicine)  Extended Emergency Contact Information Primary Emergency Contact: Mikel Cella of [place] Phone: [phone] Mobile Phone: [phone] Relation: Sister  Code Status: Full code Goals of care: Advanced Directive information    04/01/2022    9:07 AM  Advanced Directives  Does Patient Have a Medical Advance Directive? No  Would patient like information on creating a medical advance directive? No - Patient declined     Chief Complaint  Patient presents with   Medical Management of Chronic Issues    6 month follow up.   Health Maintenance    Discuss the need for Mammogram, and Colonoscopy.   Immunizations    Discuss the need for Influenza vaccine, and Covid Booster.    HPI:  Pt is a 66 y.o. female seen today for 64-monthfollow-up for medical management of chronic diseases. She denies any acute issues.   Hypothyroidism -last TSH level was low 0.20 we will recheck TSH level today  Hyperlipidemia -past cholesterol was 203 with a LDL of 114.  Triglycerides were normal at 109Has been doing pickerball three times a week.Plans to restart her Yoga exercise which she did for 5 yrs    Migraine - has not had a migraine in the past 6 months. Not used Imitrex. Thinks stress level was contributing to the migraines when she was working.   Depression - Paxil effective.states mood has improved.used to sleep a lot but no longer sleep too much.   Nasal congestion/allergies - Flonase effective    Health maintenance; Due for screening mammography and colonoscopy.Has cologuard supply but has not mailed.  Also due for influenza vaccine and COVID booster vaccine.  Made aware to get her COVID booster vaccine at the pharmacy.   Past Medical History:  Diagnosis Date   Allergy    Arthritis    Depression    H/O mammogram 2021   History of  Papanicolaou smear of cervix 2020   Migraine    Thyroid disease    No past surgical history on file.  Allergies  Allergen Reactions   Penicillins     Has patient had a PCN reaction causing immediate rash, facial/tongue/throat swelling, SOB or lightheadedness with hypotension: Yes Has patient had a PCN reaction causing severe rash involving mucus membranes or skin necrosis: No Has patient had a PCN reaction that required hospitalization No Has patient had a PCN reaction occurring within the last 10 years: No If all of the above answers are "NO", then may proceed with Cephalosporin use.    Sulfa Antibiotics     Rash    Allergies as of 04/01/2022       Reactions   Penicillins    Has patient had a PCN reaction causing immediate rash, facial/tongue/throat swelling, SOB or lightheadedness with hypotension: Yes Has patient had a PCN reaction causing severe rash involving mucus membranes or skin necrosis: No Has patient had a PCN reaction that required hospitalization No Has patient had a PCN reaction occurring within the last 10 years: No If all of the above answers are "NO", then may proceed with Cephalosporin use.   Sulfa Antibiotics    Rash        Medication List        Accurate as of April 01, 2022  9:09 AM. If you have any questions, ask your nurse or doctor.  fluticasone 50 MCG/ACT nasal spray Commonly known as: FLONASE Place 2 sprays into both nostrils daily.   IBUPROFEN PO Take 1 tablet by mouth as needed.   levothyroxine 50 MCG tablet Commonly known as: SYNTHROID Take 1 tablet (50 mcg total) by mouth daily.   MULTIVITAMIN PO Take 1 capsule by mouth daily.   PARoxetine 40 MG tablet Commonly known as: PAXIL Take 1 tablet (40 mg total) by mouth daily.   SUMAtriptan 25 MG tablet Commonly known as: Imitrex Take 1 tablet (25 mg total) by mouth every 2 (two) hours as needed for migraine. May repeat in 2 hours if headache persists or recurs.    TYLENOL PO Take 2 capsules by mouth as needed.        Review of Systems  Constitutional:  Negative for appetite change, chills, fatigue, fever and unexpected weight change.  HENT:  Negative for congestion, dental problem, ear discharge, ear pain, facial swelling, hearing loss, nosebleeds, postnasal drip, rhinorrhea, sinus pressure, sinus pain, sneezing, sore throat, tinnitus and trouble swallowing.   Eyes:  Negative for pain, discharge, redness, itching and visual disturbance.  Respiratory:  Negative for cough, chest tightness, shortness of breath and wheezing.   Cardiovascular:  Negative for chest pain, palpitations and leg swelling.  Gastrointestinal:  Negative for abdominal distention, abdominal pain, blood in stool, constipation, diarrhea, nausea and vomiting.  Endocrine: Negative for cold intolerance, heat intolerance, polydipsia, polyphagia and polyuria.  Genitourinary:  Negative for difficulty urinating, dysuria, flank pain, frequency and urgency.  Musculoskeletal:  Negative for arthralgias, back pain, gait problem, joint swelling, myalgias, neck pain and neck stiffness.  Skin:  Negative for color change, pallor, rash and wound.  Neurological:  Negative for dizziness, syncope, speech difficulty, weakness, light-headedness, numbness and headaches.  Hematological:  Does not bruise/bleed easily.  Psychiatric/Behavioral:  Negative for agitation, behavioral problems, confusion, hallucinations, self-injury, sleep disturbance and suicidal ideas. The patient is not nervous/anxious.     Immunization History  Administered Date(s) Administered   Influenza Inj Mdck Quad With Preservative 05/13/2018   Influenza,inj,Quad PF,6+ Mos 04/14/2017   Influenza-Unspecified 04/18/2014, 05/02/2015, 04/23/2020   PFIZER(Purple Top)SARS-COV-2 Vaccination 09/23/2019, 10/13/2019, 07/02/2020   Pneumococcal Conjugate-13 09/28/2021   Tdap 07/16/2011, 10/05/2021   Zoster Recombinat (Shingrix) 10/05/2021,  12/17/2021   Pertinent  Health Maintenance Due  Topic Date Due   COLONOSCOPY (Pts 45-43yr Insurance coverage will need to be confirmed)  Never done   MAMMOGRAM  11/01/2021   INFLUENZA VACCINE  02/12/2022   PAP SMEAR-Modifier  10/25/2022   DEXA SCAN  Completed      05/02/2020    8:11 AM 09/12/2021    3:30 PM 09/28/2021    8:45 AM 10/29/2021    1:07 PM 04/01/2022    9:07 AM  FGuayanillain the past year? 0 0 0 0 0  Was there an injury with Fall? 0 0 0 0 0  Fall Risk Category Calculator 0 0 0 0 0  Fall Risk Category Low Low Low Low Low  Patient Fall Risk Level Low fall risk Low fall risk Low fall risk Low fall risk Low fall risk  Patient at Risk for Falls Due to  No Fall Risks No Fall Risks No Fall Risks No Fall Risks  Fall risk Follow up Falls evaluation completed Falls evaluation completed Falls evaluation completed Falls evaluation completed Falls evaluation completed   Functional Status Survey:    Vitals:   04/01/22 0903  BP: 136/76  Pulse: 73  Resp: 16  Temp: (!)  97.2 F (36.2 C)  SpO2: 98%  Weight: 145 lb 9.6 oz (66 kg)  Height: 5' 3.5" (1.613 m)   Body mass index is 25.39 kg/m. Physical Exam Vitals reviewed.  Constitutional:      General: She is not in acute distress.    Appearance: Normal appearance. She is overweight. She is not ill-appearing or diaphoretic.  HENT:     Head: Normocephalic.     Right Ear: Tympanic membrane, ear canal and external ear normal. There is no impacted cerumen.     Left Ear: Tympanic membrane, ear canal and external ear normal. There is no impacted cerumen.     Nose: Nose normal. No congestion or rhinorrhea.     Mouth/Throat:     Mouth: Mucous membranes are moist.     Pharynx: Oropharynx is clear. No oropharyngeal exudate or posterior oropharyngeal erythema.  Eyes:     General: No scleral icterus.       Right eye: No discharge.        Left eye: No discharge.     Extraocular Movements: Extraocular movements intact.      Conjunctiva/sclera: Conjunctivae normal.     Pupils: Pupils are equal, round, and reactive to light.  Neck:     Vascular: No carotid bruit.  Cardiovascular:     Rate and Rhythm: Normal rate and regular rhythm.     Pulses: Normal pulses.     Heart sounds: Normal heart sounds. No murmur heard.    No friction rub. No gallop.  Pulmonary:     Effort: Pulmonary effort is normal. No respiratory distress.     Breath sounds: Normal breath sounds. No wheezing, rhonchi or rales.  Chest:     Chest wall: No tenderness.  Abdominal:     General: Bowel sounds are normal. There is no distension.     Palpations: Abdomen is soft. There is no mass.     Tenderness: There is no abdominal tenderness. There is no right CVA tenderness, left CVA tenderness, guarding or rebound.  Musculoskeletal:        General: No swelling or tenderness. Normal range of motion.     Cervical back: Normal range of motion. No rigidity or tenderness.     Right lower leg: No edema.     Left lower leg: No edema.  Lymphadenopathy:     Cervical: No cervical adenopathy.  Skin:    General: Skin is warm and dry.     Coloration: Skin is not pale.     Findings: No bruising, erythema, lesion or rash.  Neurological:     Mental Status: She is alert and oriented to person, place, and time.     Cranial Nerves: No cranial nerve deficit.     Sensory: No sensory deficit.     Motor: No weakness.     Coordination: Coordination normal.     Gait: Gait normal.  Psychiatric:        Mood and Affect: Mood normal.        Speech: Speech normal.        Behavior: Behavior normal.        Thought Content: Thought content normal.        Judgment: Judgment normal.     Labs reviewed: Recent Labs    09/28/21 0952  NA 139  K 4.3  CL 103  CO2 27  GLUCOSE 91  BUN 8  CREATININE 0.61  CALCIUM 9.3   Recent Labs    09/28/21 0952  AST 24  ALT 26  BILITOT 0.6  PROT 6.3   Recent Labs    09/28/21 0952  WBC 4.7  NEUTROABS 2,063  HGB 13.3   HCT 39.5  MCV 92.3  PLT 299   Lab Results  Component Value Date   TSH 0.20 (L) 02/08/2022   No results found for: "HGBA1C" Lab Results  Component Value Date   CHOL 203 (H) 09/28/2021   HDL 67 09/28/2021   LDLCALC 114 (H) 09/28/2021   LDLDIRECT 151 (H) 11/02/2013   TRIG 109 09/28/2021   CHOLHDL 3.0 09/28/2021    Significant Diagnostic Results in last 30 days:  No results found.  Assessment/Plan 1. Need for influenza vaccination Flut shot administered by CMA no acute reaction reported.   - Flu Vaccine QUAD High Dose(Fluad)  2. Acquired hypothyroidism Lab Results  Component Value Date   TSH 0.20 (L) 02/08/2022  -Continue on levothyroxine 50 mcg daily on empty stomach.  We will recheck TSH and adjust medication as needed - TSH; Future - COMPLETE METABOLIC PANEL WITH GFR; Future - CBC with Differential/Platelet; Future  3. Mixed hyperlipidemia Latest LDL 151 Continue dietary modification and exercise Consider starting on statin if LDL still high - Lipid panel; Future  4. Migraine variants, not intractable Has not had a migraine for the past 6 months.  Continue on Imitrex as needed - COMPLETE METABOLIC PANEL WITH GFR; Future   Family/ staff Communication: Reviewed plan of care with patient verbalize understanding   Labs/tests ordered:  - CBC with Differential/Platelet - CMP with eGFR([facility]) - TSH - Lipid panel  Next Appointment : Return in about 6 months (around 09/30/2022) for medical mangement of chronic issues., Fasting labs in 6 months prior to visit.   Sandrea Hughs, NP

## 2022-04-02 LAB — TSH: TSH: 6.26 mIU/L — ABNORMAL HIGH (ref 0.40–4.50)

## 2022-04-03 ENCOUNTER — Other Ambulatory Visit: Payer: Medicare Other

## 2022-04-03 DIAGNOSIS — E039 Hypothyroidism, unspecified: Secondary | ICD-10-CM

## 2022-04-04 ENCOUNTER — Other Ambulatory Visit: Payer: Self-pay

## 2022-04-04 DIAGNOSIS — E039 Hypothyroidism, unspecified: Secondary | ICD-10-CM

## 2022-04-09 ENCOUNTER — Other Ambulatory Visit: Payer: Self-pay | Admitting: Family

## 2022-04-09 DIAGNOSIS — F3342 Major depressive disorder, recurrent, in full remission: Secondary | ICD-10-CM

## 2022-04-09 NOTE — Telephone Encounter (Signed)
Patient has request refill on medication Paroxetine '40mg'$ . Medication last refilled 10/02/2021. Medication has High Risk Warnings.Patient medication pend and sent to PCP Ngetich, Nelda Bucks, NP .

## 2022-04-22 ENCOUNTER — Encounter: Payer: Self-pay | Admitting: Family

## 2022-04-22 ENCOUNTER — Ambulatory Visit (INDEPENDENT_AMBULATORY_CARE_PROVIDER_SITE_OTHER): Payer: Medicare Other | Admitting: Family

## 2022-04-22 VITALS — BP 126/70 | HR 77 | Temp 97.7°F | Ht 63.5 in | Wt 146.0 lb

## 2022-04-22 DIAGNOSIS — H6122 Impacted cerumen, left ear: Secondary | ICD-10-CM

## 2022-04-22 DIAGNOSIS — J0101 Acute recurrent maxillary sinusitis: Secondary | ICD-10-CM | POA: Diagnosis not present

## 2022-04-22 MED ORDER — DOXYCYCLINE HYCLATE 100 MG PO TABS: 100.0000 mg | ORAL_TABLET | Freq: Two times a day (BID) | ORAL | 0 refills | Status: AC

## 2022-04-22 NOTE — Progress Notes (Signed)
Provider: Marlowe Sax FNP-C  Anterrio Mccleery, Nelda Bucks, NP  Patient Care Team: Esaiah Wanless, Nelda Bucks, NP as PCP - General (Family Medicine)  Extended Emergency Contact Information Primary Emergency Contact: Mikel Cella of [place] Phone: [phone] Mobile Phone: [phone] Relation: Sister  Code Status:  Full Code  Goals of care: Advanced Directive information    04/22/2022    3:04 PM  Advanced Directives  Does Patient Have a Medical Advance Directive? No     Chief Complaint  Patient presents with   Acute Visit    Possible sinus infection     HPI:  Pt is a 66 y.o. female seen today for an acute visit for evaluation of sinus infection x several weeks.     Past Medical History:  Diagnosis Date   Allergy    Arthritis    Depression    H/O mammogram 2021   History of Papanicolaou smear of cervix 2020   Migraine    Thyroid disease    History reviewed. No pertinent surgical history.  Allergies  Allergen Reactions   Penicillins     Has patient had a PCN reaction causing immediate rash, facial/tongue/throat swelling, SOB or lightheadedness with hypotension: Yes Has patient had a PCN reaction causing severe rash involving mucus membranes or skin necrosis: No Has patient had a PCN reaction that required hospitalization No Has patient had a PCN reaction occurring within the last 10 years: No If all of the above answers are "NO", then may proceed with Cephalosporin use.    Sulfa Antibiotics     Rash    Outpatient Encounter Medications as of 04/22/2022  Medication Sig   Acetaminophen (TYLENOL PO) Take 2 capsules by mouth as needed.   doxycycline (VIBRA-TABS) 100 MG tablet Take 1 tablet (100 mg total) by mouth 2 (two) times daily for 7 days.   fluticasone (FLONASE) 50 MCG/ACT nasal spray Place 2 sprays into both nostrils daily.   IBUPROFEN PO Take 1 tablet by mouth as needed.   levothyroxine (SYNTHROID) 50 MCG tablet Take 1 tablet (50 mcg total) by  mouth daily.   Multiple Vitamin (MULTIVITAMIN PO) Take 1 capsule by mouth daily.   PARoxetine (PAXIL) 40 MG tablet TAKE 1 TABLET DAILY   SUMAtriptan (IMITREX) 25 MG tablet Take 1 tablet (25 mg total) by mouth every 2 (two) hours as needed for migraine. May repeat in 2 hours if headache persists or recurs.   No facility-administered encounter medications on file as of 04/22/2022.    Review of Systems  Constitutional:  Negative for appetite change, chills, fatigue and fever.  HENT:  Positive for congestion, postnasal drip, rhinorrhea, sinus pressure and sinus pain. Negative for dental problem, ear discharge, ear pain, facial swelling, hearing loss, nosebleeds, sneezing, sore throat, tinnitus and trouble swallowing.        Recently had dental work Amoxicillin was given by dentist but she developed a rash across the forehead.   Eyes:  Negative for pain, discharge, redness, itching and visual disturbance.  Respiratory:  Negative for cough, chest tightness, shortness of breath and wheezing.   Cardiovascular:  Negative for chest pain, palpitations and leg swelling.  Gastrointestinal:  Negative for abdominal distention, abdominal pain, diarrhea, nausea and vomiting.  Skin:  Negative for color change, pallor and rash.  Neurological:  Negative for dizziness, light-headedness, numbness and headaches.    Immunization History  Administered Date(s) Administered   Fluad Quad(high Dose 65+) 04/01/2022   Influenza Inj Mdck Quad With Preservative 05/13/2018  Influenza,inj,Quad PF,6+ Mos 04/14/2017   Influenza-Unspecified 04/18/2014, 05/02/2015, 04/23/2020   PFIZER(Purple Top)SARS-COV-2 Vaccination 09/23/2019, 10/13/2019, 07/02/2020   Pneumococcal Conjugate-13 09/28/2021   Tdap 07/16/2011, 10/05/2021   Zoster Recombinat (Shingrix) 10/05/2021, 12/17/2021   Pertinent  Health Maintenance Due  Topic Date Due   COLONOSCOPY (Pts 45-60yr Insurance coverage will need to be confirmed)  Never done   MAMMOGRAM   11/01/2021   PAP SMEAR-Modifier  10/25/2022   INFLUENZA VACCINE  Completed   DEXA SCAN  Completed      05/02/2020    8:11 AM 09/12/2021    3:30 PM 09/28/2021    8:45 AM 10/29/2021    1:07 PM 04/01/2022    9:07 AM  Fall Risk  Falls in the past year? 0 0 0 0 0  Was there an injury with Fall? 0 0 0 0 0  Fall Risk Category Calculator 0 0 0 0 0  Fall Risk Category Low Low Low Low Low  Patient Fall Risk Level Low fall risk Low fall risk Low fall risk Low fall risk Low fall risk  Patient at Risk for Falls Due to  No Fall Risks No Fall Risks No Fall Risks No Fall Risks  Fall risk Follow up Falls evaluation completed Falls evaluation completed Falls evaluation completed Falls evaluation completed Falls evaluation completed   Functional Status Survey:    Vitals:   04/22/22 1456  BP: 126/70  Pulse: 77  Temp: 97.7 F (36.5 C)  SpO2: 95%  Weight: 146 lb (66.2 kg)  Height: 5' 3.5" (1.613 m)   Body mass index is 25.46 kg/m. Physical Exam Vitals reviewed.  Constitutional:      General: She is not in acute distress.    Appearance: Normal appearance. She is normal weight. She is not ill-appearing or diaphoretic.  HENT:     Head: Normocephalic.     Right Ear: There is impacted cerumen.     Left Ear: Tympanic membrane, ear canal and external ear normal. There is no impacted cerumen.     Nose: No congestion or rhinorrhea.     Right Turbinates: Not enlarged, swollen or pale.     Left Turbinates: Not enlarged, swollen or pale.     Right Sinus: Maxillary sinus tenderness present.     Left Sinus: Maxillary sinus tenderness present.     Mouth/Throat:     Mouth: Mucous membranes are moist.     Pharynx: Oropharynx is clear. No oropharyngeal exudate or posterior oropharyngeal erythema.  Eyes:     General: No scleral icterus.       Right eye: No discharge.        Left eye: No discharge.     Extraocular Movements: Extraocular movements intact.     Conjunctiva/sclera: Conjunctivae normal.      Pupils: Pupils are equal, round, and reactive to light.  Cardiovascular:     Rate and Rhythm: Normal rate and regular rhythm.     Pulses: Normal pulses.     Heart sounds: Normal heart sounds. No murmur heard.    No friction rub. No gallop.  Pulmonary:     Effort: Pulmonary effort is normal. No respiratory distress.     Breath sounds: Normal breath sounds. No wheezing, rhonchi or rales.  Chest:     Chest wall: No tenderness.  Abdominal:     General: Bowel sounds are normal. There is no distension.     Palpations: Abdomen is soft. There is no mass.     Tenderness: There is no  abdominal tenderness. There is no right CVA tenderness, left CVA tenderness, guarding or rebound.  Musculoskeletal:        General: No swelling or tenderness. Normal range of motion.     Cervical back: Normal range of motion. No rigidity or tenderness.     Right lower leg: No edema.     Left lower leg: No edema.  Lymphadenopathy:     Cervical: No cervical adenopathy.  Skin:    General: Skin is warm and dry.     Coloration: Skin is not pale.     Findings: No erythema or rash.  Neurological:     Mental Status: She is alert and oriented to person, place, and time.     Gait: Gait normal.  Psychiatric:        Mood and Affect: Mood normal.        Speech: Speech normal.        Behavior: Behavior normal.     Labs reviewed: Recent Labs    09/28/21 0952  NA 139  K 4.3  CL 103  CO2 27  GLUCOSE 91  BUN 8  CREATININE 0.61  CALCIUM 9.3   Recent Labs    09/28/21 0952  AST 24  ALT 26  BILITOT 0.6  PROT 6.3   Recent Labs    09/28/21 0952  WBC 4.7  NEUTROABS 2,063  HGB 13.3  HCT 39.5  MCV 92.3  PLT 299   Lab Results  Component Value Date   TSH 6.26 (H) 04/01/2022   No results found for: "HGBA1C" Lab Results  Component Value Date   CHOL 203 (H) 09/28/2021   HDL 67 09/28/2021   LDLCALC 114 (H) 09/28/2021   LDLDIRECT 151 (H) 11/02/2013   TRIG 109 09/28/2021   CHOLHDL 3.0 09/28/2021     Significant Diagnostic Results in last 30 days:  No results found.  Assessment/Plan  1. Acute recurrent maxillary sinusitis Afebrile  Maxillary tender to percussion worst on right side. - Netipot use recommended due recurrent sinus infection  - start on Doxycycline as below side effects discussed  - doxycycline (VIBRA-TABS) 100 MG tablet; Take 1 tablet (100 mg total) by mouth 2 (two) times daily for 7 days.  Dispense: 14 tablet; Refill: 0  2. Impacted cerumen of left ear TM not visualized due to cerumen impaction  Refer to ENT for evaluation  - Ambulatory referral to ENT   Family/ staff Communication: Reviewed plan of care with patient verbalized understanding   Labs/tests ordered: None   Next Appointment: Return if symptoms worsen or fail to improve.   Sandrea Hughs, NP

## 2022-05-30 ENCOUNTER — Other Ambulatory Visit: Payer: Self-pay

## 2022-05-30 ENCOUNTER — Other Ambulatory Visit: Payer: Medicare Other

## 2022-05-30 DIAGNOSIS — E039 Hypothyroidism, unspecified: Secondary | ICD-10-CM

## 2022-05-30 DIAGNOSIS — G43809 Other migraine, not intractable, without status migrainosus: Secondary | ICD-10-CM

## 2022-05-30 DIAGNOSIS — E782 Mixed hyperlipidemia: Secondary | ICD-10-CM

## 2022-05-30 LAB — TSH: TSH: 19.7 mIU/L — ABNORMAL HIGH (ref 0.40–4.50)

## 2022-05-31 ENCOUNTER — Other Ambulatory Visit: Payer: Self-pay

## 2022-05-31 DIAGNOSIS — E0590 Thyrotoxicosis, unspecified without thyrotoxic crisis or storm: Secondary | ICD-10-CM

## 2022-05-31 DIAGNOSIS — R7989 Other specified abnormal findings of blood chemistry: Secondary | ICD-10-CM

## 2022-06-20 ENCOUNTER — Ambulatory Visit (INDEPENDENT_AMBULATORY_CARE_PROVIDER_SITE_OTHER): Payer: Medicare Other

## 2022-06-20 ENCOUNTER — Encounter: Payer: Self-pay | Admitting: Podiatry

## 2022-06-20 ENCOUNTER — Ambulatory Visit (INDEPENDENT_AMBULATORY_CARE_PROVIDER_SITE_OTHER): Payer: Medicare Other | Admitting: Podiatry

## 2022-06-20 VITALS — BP 103/62 | HR 80

## 2022-06-20 DIAGNOSIS — M2011 Hallux valgus (acquired), right foot: Secondary | ICD-10-CM

## 2022-06-20 DIAGNOSIS — M778 Other enthesopathies, not elsewhere classified: Secondary | ICD-10-CM

## 2022-06-20 DIAGNOSIS — Q6671 Congenital pes cavus, right foot: Secondary | ICD-10-CM | POA: Diagnosis not present

## 2022-06-20 DIAGNOSIS — Q6672 Congenital pes cavus, left foot: Secondary | ICD-10-CM | POA: Diagnosis not present

## 2022-06-20 DIAGNOSIS — Q667 Congenital pes cavus, unspecified foot: Secondary | ICD-10-CM

## 2022-06-23 NOTE — Progress Notes (Signed)
Subjective:  Patient ID: Sheri Adkins, female    DOB: 1956-02-06,  MRN: [id number] HPI Chief Complaint  Patient presents with   Foot Pain    1st MPJ right - bunion deformity x years, callused, hallux pushing into 2nd, worn orthotics for 15+ years and would like to get a new pair   New Patient (Initial Visit)    66 y.o. female presents with the above complaint.   ROS: Denies fever chills nausea vomit muscle aches pains calf pain back pain chest pain shortness of breath.  Past Medical History:  Diagnosis Date   Allergy    Arthritis    Depression    H/O mammogram 2021   History of Papanicolaou smear of cervix 2020   Migraine    Thyroid disease    No past surgical history on file.  Current Outpatient Medications:    Acetaminophen (TYLENOL PO), Take 2 capsules by mouth as needed., Disp: , Rfl:    fluticasone (FLONASE) 50 MCG/ACT nasal spray, Place 2 sprays into both nostrils daily., Disp: 16 g, Rfl: 6   IBUPROFEN PO, Take 1 tablet by mouth as needed., Disp: , Rfl:    levothyroxine (SYNTHROID) 75 MCG tablet, Take 75 mcg by mouth daily before breakfast., Disp: , Rfl:    Multiple Vitamin (MULTIVITAMIN PO), Take 1 capsule by mouth daily., Disp: , Rfl:    PARoxetine (PAXIL) 40 MG tablet, TAKE 1 TABLET DAILY, Disp: 90 tablet, Rfl: 1   SUMAtriptan (IMITREX) 25 MG tablet, Take 1 tablet (25 mg total) by mouth every 2 (two) hours as needed for migraine. May repeat in 2 hours if headache persists or recurs., Disp: 10 tablet, Rfl: 0  Allergies  Allergen Reactions   Penicillins     Has patient had a PCN reaction causing immediate rash, facial/tongue/throat swelling, SOB or lightheadedness with hypotension: Yes Has patient had a PCN reaction causing severe rash involving mucus membranes or skin necrosis: No Has patient had a PCN reaction that required hospitalization No Has patient had a PCN reaction occurring within the last 10 years: No If all of the above answers are "NO", then may  proceed with Cephalosporin use.    Sulfa Antibiotics     Rash   Review of Systems Objective:   Vitals:   06/20/22 0911  BP: 103/62  Pulse: 80    General: Well developed, nourished, in no acute distress, alert and oriented x3   Dermatological: Skin is warm, dry and supple bilateral. Nails x 10 are well maintained; remaining integument appears unremarkable at this time. There are no open sores, no preulcerative lesions, no rash or signs of infection present.  Vascular: Dorsalis Pedis artery and Posterior Tibial artery pedal pulses are 2/4 bilateral with immedate capillary fill time. Pedal hair growth present. No varicosities and no lower extremity edema present bilateral.   Neruologic: Grossly intact via light touch bilateral. Vibratory intact via tuning fork bilateral. Protective threshold with Semmes Wienstein monofilament intact to all pedal sites bilateral. Patellar and Achilles deep tendon reflexes 2+ bilateral. No Babinski or clonus noted bilateral.   Musculoskeletal: No gross boney pedal deformities bilateral. No pain, crepitus, or limitation noted with foot and ankle range of motion bilateral. Muscular strength 5/5 in all groups tested bilateral.  Hallux abductovalgus deformity of the right foot significant in nature.  Minimally reducible.  She has pain and limitation range of motion of the first metatarsophalangeal joint of the right foot.  Mildly elevated medial longitudinal arch.  Left foot appears to be  relatively normal.  No dorsal spurring noted.  Gait: Unassisted, Nonantalgic.    Radiographs:  Radiographs taken today demonstrate osseously mature individual mild diffuse demineralization bilateral.  Hallux valgus deformity of the right foot increased greater than normal first intermetatarsal angle greater than 20 degrees.  Hallux abductus angle greater than 30 degrees.  Left foot is rectus and relatively normal.    Assessment & Plan:   Assessment: Hallux abductovalgus  deformity right foot.  Left foot is normal.  Capsulitis first metatarsophalangeal joint right foot.  Metatarsalgia bilateral.  Pes cavus bilateral.  Plan: Scheduled for orthotics.  She was casted today.     Nelton Amsden T. [place], [place]

## 2022-07-04 ENCOUNTER — Other Ambulatory Visit: Payer: Self-pay | Admitting: *Deleted

## 2022-07-04 DIAGNOSIS — R0981 Nasal congestion: Secondary | ICD-10-CM

## 2022-07-04 MED ORDER — FLUTICASONE PROPIONATE 50 MCG/ACT NA SUSP: 2.0000 | Freq: Every day | NASAL | 3 refills | Status: DC

## 2022-07-04 NOTE — Telephone Encounter (Signed)
Pharmacy requested refill

## 2022-07-23 ENCOUNTER — Telehealth: Payer: Self-pay | Admitting: Podiatry

## 2022-07-23 NOTE — Telephone Encounter (Signed)
Lmom for patient to schedule appt to pick up orthotics .   Balance Pending with Medicare

## 2022-07-26 ENCOUNTER — Other Ambulatory Visit: Payer: Medicare Other

## 2022-07-26 DIAGNOSIS — E0590 Thyrotoxicosis, unspecified without thyrotoxic crisis or storm: Secondary | ICD-10-CM

## 2022-07-27 DIAGNOSIS — R0981 Nasal congestion: Secondary | ICD-10-CM

## 2022-07-27 LAB — TSH: TSH: 3.18 mIU/L (ref 0.40–4.50)

## 2022-07-30 ENCOUNTER — Emergency Department (HOSPITAL_COMMUNITY): Payer: Medicare Other

## 2022-07-30 ENCOUNTER — Other Ambulatory Visit: Payer: Self-pay

## 2022-07-30 ENCOUNTER — Emergency Department (HOSPITAL_COMMUNITY)
Admission: EM | Admit: 2022-07-30 | Discharge: 2022-07-30 | Disposition: A | Discharge status: Home / Self Care | Payer: Medicare Other | Attending: Emergency Medicine | Admitting: Emergency Medicine

## 2022-07-30 DIAGNOSIS — S0990XA Unspecified injury of head, initial encounter: Secondary | ICD-10-CM

## 2022-07-30 DIAGNOSIS — Y9373 Activity, racquet and hand sports: Secondary | ICD-10-CM | POA: Diagnosis not present

## 2022-07-30 DIAGNOSIS — R519 Headache, unspecified: Secondary | ICD-10-CM | POA: Diagnosis present

## 2022-07-30 DIAGNOSIS — M542 Cervicalgia: Secondary | ICD-10-CM | POA: Insufficient documentation

## 2022-07-30 DIAGNOSIS — W03XXXA Other fall on same level due to collision with another person, initial encounter: Secondary | ICD-10-CM | POA: Diagnosis not present

## 2022-07-30 DIAGNOSIS — W19XXXA Unspecified fall, initial encounter: Secondary | ICD-10-CM

## 2022-07-30 NOTE — ED Provider Notes (Signed)
[facility] DEPT Provider Note   CSN: [id number] Arrival date & time: 07/30/22  1419     History  Chief Complaint  Patient presents with   Sheri Adkins    Sheri Adkins is a 67 y.o. female  presenting to the ED with bodily pains following an injury playing pickleball.  Pt states she was going to get the ball when she collided with another player.  Pt was pushed backwards and fell, hitting the back of her head.  Complaining of posterior head, neck, and upper back pain.  Denies LOC, dizziness, shortness of breath, or lightheadedness prior to or preceding fall.  Denies N/V since fall.  Denies numbness, weakness, or tingling of extremities.  Not on anticoagulation.  No other complaints at this time.  The history is provided by the patient and medical records.  Fall     Home Medications Prior to Admission medications   Medication Sig Start Date End Date Taking? Authorizing Provider  Acetaminophen (TYLENOL PO) Take 2 capsules by mouth as needed.    [provider]  fluticasone (FLONASE) 50 MCG/ACT nasal spray Place 2 sprays into both nostrils daily. 07/04/22   Ngetich, Dinah C, NP  IBUPROFEN PO Take 1 tablet by mouth as needed.    [provider]  levothyroxine (SYNTHROID) 75 MCG tablet Take 75 mcg by mouth daily before breakfast.    [provider]  Multiple Vitamin (MULTIVITAMIN PO) Take 1 capsule by mouth daily.    [provider]  PARoxetine (PAXIL) 40 MG tablet TAKE 1 TABLET DAILY 04/09/22   Ngetich, Dinah C, NP  SUMAtriptan (IMITREX) 25 MG tablet Take 1 tablet (25 mg total) by mouth every 2 (two) hours as needed for migraine. May repeat in 2 hours if headache persists or recurs. 09/28/21   Ngetich, Nelda Bucks, NP      Allergies    Penicillins and Sulfa antibiotics    Review of Systems   Review of Systems  Constitutional:        Fall with head pain, neck pain, upper back pain    Physical Exam Updated Vital Signs BP  117/63   Pulse 72   Temp 98.5 F (36.9 C) (Oral)   Resp 16   Wt 66 kg   SpO2 100%   BMI 25.37 kg/m  Physical Exam Vitals and nursing note reviewed.  Constitutional:      General: She is not in acute distress.    Appearance: She is well-developed. She is not ill-appearing, toxic-appearing or diaphoretic.  HENT:     Head: Normocephalic and atraumatic.     Right Ear: External ear normal.     Left Ear: External ear normal.     Nose: Nose normal.     Mouth/Throat:     Mouth: Mucous membranes are moist.     Pharynx: Oropharynx is clear.  Eyes:     Conjunctiva/sclera: Conjunctivae normal.  Neck:     Comments: Presents in C-Collar, ROM deferred due to possibility of fracture. Cardiovascular:     Rate and Rhythm: Normal rate and regular rhythm.     Pulses: Normal pulses.     Heart sounds: No murmur heard. Pulmonary:     Effort: Pulmonary effort is normal. No respiratory distress.     Breath sounds: Normal breath sounds.  Abdominal:     Palpations: Abdomen is soft.     Tenderness: There is no abdominal tenderness.  Musculoskeletal:        General: No swelling.  Cervical back: Neck supple. Tenderness present.     Comments: BUE and BLE appear neurovascularly intact.  Midline and paraspinal muscle tenderness from C3-C7.  Skin:    General: Skin is warm and dry.     Capillary Refill: Capillary refill takes less than 2 seconds.  Neurological:     Mental Status: She is alert and oriented to person, place, and time.     Cranial Nerves: No cranial nerve deficit.     Sensory: No sensory deficit.     Coordination: Coordination normal.     Gait: Gait normal.  Psychiatric:        Mood and Affect: Mood normal.     ED Results / Procedures / Treatments   Labs (all labs ordered are listed, but only abnormal results are displayed) Labs Reviewed - No data to display  EKG None  Radiology CT Thoracic Spine Wo Contrast  Result Date: 07/30/2022 CLINICAL DATA:  [place] playing pickle  ball.  Back pain. EXAM: CT THORACIC SPINE WITHOUT CONTRAST TECHNIQUE: Multidetector CT images of the thoracic were obtained using the standard protocol without intravenous contrast. RADIATION DOSE REDUCTION: This exam was performed according to the departmental dose-optimization program which includes automated exposure control, adjustment of the mA and/or kV according to patient size and/or use of iterative reconstruction technique. COMPARISON:  None Available. FINDINGS: Alignment: Normal Vertebrae: No acute fracture. Paraspinal and other soft tissues: No significant paraspinal findings. No mediastinal mass or adenopathy. No acute pulmonary findings worrisome pulmonary lesions. Disc levels: No thoracic disc protrusions, spinal or foraminal stenosis. IMPRESSION: 1. Normal alignment and no acute fracture. 2. No thoracic disc protrusions, spinal or foraminal stenosis. Electronically Signed   By: Marijo Sanes M.D.   On: 07/30/2022 17:34   CT Head Wo Contrast  Result Date: 07/30/2022 CLINICAL DATA:  [place] playing pickle ball. EXAM: CT HEAD WITHOUT CONTRAST CT CERVICAL SPINE WITHOUT CONTRAST TECHNIQUE: Multidetector CT imaging of the head and cervical spine was performed following the standard protocol without intravenous contrast. Multiplanar CT image reconstructions of the cervical spine were also generated. RADIATION DOSE REDUCTION: This exam was performed according to the departmental dose-optimization program which includes automated exposure control, adjustment of the mA and/or kV according to patient size and/or use of iterative reconstruction technique. COMPARISON:  None Available. FINDINGS: CT HEAD FINDINGS Brain: No evidence of acute infarction, hemorrhage, hydrocephalus, extra-axial collection or mass lesion/mass effect. Vascular: Scattered vascular calcifications but no aneurysm hyperdense vessels. Skull: No skull fracture. Sinuses/Orbits: The paranasal sinuses and mastoid air cells are clear except for  mild mucoperiosteal thickening involving the right maxillary sinus. The globes are intact. Other: No scalp lesions or scalp hematoma. CT CERVICAL SPINE FINDINGS Alignment: Normal Skull base and vertebrae: No acute fracture. No primary bone lesion or focal pathologic process. Soft tissues and spinal canal: No prevertebral fluid or swelling. No visible canal hematoma. Disc levels: The spinal canal is generous. No large disc protrusions, significant spinal or foraminal stenosis. Uncinate spurring changes and mild facet disease noted on the left at C5-6 with mild foraminal stenosis. Upper chest: The lung apices are grossly clear. Other: Scatted carotid artery calcifications. IMPRESSION: 1. No acute intracranial findings or skull fracture. 2. Normal alignment of the cervical spine without acute fracture. Electronically Signed   By: Marijo Sanes M.D.   On: 07/30/2022 17:32   CT Cervical Spine Wo Contrast  Result Date: 07/30/2022 CLINICAL DATA:  [place] playing pickle ball. EXAM: CT HEAD WITHOUT CONTRAST CT CERVICAL SPINE WITHOUT CONTRAST TECHNIQUE: Multidetector  CT imaging of the head and cervical spine was performed following the standard protocol without intravenous contrast. Multiplanar CT image reconstructions of the cervical spine were also generated. RADIATION DOSE REDUCTION: This exam was performed according to the departmental dose-optimization program which includes automated exposure control, adjustment of the mA and/or kV according to patient size and/or use of iterative reconstruction technique. COMPARISON:  None Available. FINDINGS: CT HEAD FINDINGS Brain: No evidence of acute infarction, hemorrhage, hydrocephalus, extra-axial collection or mass lesion/mass effect. Vascular: Scattered vascular calcifications but no aneurysm hyperdense vessels. Skull: No skull fracture. Sinuses/Orbits: The paranasal sinuses and mastoid air cells are clear except for mild mucoperiosteal thickening involving the right  maxillary sinus. The globes are intact. Other: No scalp lesions or scalp hematoma. CT CERVICAL SPINE FINDINGS Alignment: Normal Skull base and vertebrae: No acute fracture. No primary bone lesion or focal pathologic process. Soft tissues and spinal canal: No prevertebral fluid or swelling. No visible canal hematoma. Disc levels: The spinal canal is generous. No large disc protrusions, significant spinal or foraminal stenosis. Uncinate spurring changes and mild facet disease noted on the left at C5-6 with mild foraminal stenosis. Upper chest: The lung apices are grossly clear. Other: Scatted carotid artery calcifications. IMPRESSION: 1. No acute intracranial findings or skull fracture. 2. Normal alignment of the cervical spine without acute fracture. Electronically Signed   By: Marijo Sanes M.D.   On: 07/30/2022 17:32   DG Thoracic Spine 2 View  Result Date: 07/30/2022 CLINICAL DATA:  Fall playing pickel ball, back pain EXAM: THORACIC SPINE 2 VIEWS COMPARISON:  Chest radiograph 12/14/2015 FINDINGS: I do not observe a thoracic spine fracture, but there is prominence the left paraspinal soft tissues centered at about the T9 level, with leftward convexity increased compared to the prior chest radiograph from 12/14/2015. Although potentially incidental, paraspinal hematoma associated with other wise occult bony injury might be a consideration, and CT scan of the chest or CT scan of the thoracic spine might be considered for further characterization. IMPRESSION: 1. No thoracic spine fracture is identified, but there is increased prominence of left paraspinal density which could be a harbinger of occult underlying injury. Consider CT scan of the chest or thoracic spine for further characterization. Electronically Signed   By: Van Clines M.D.   On: 07/30/2022 15:44    Procedures Procedures    Medications Ordered in ED Medications - No data to display  ED Course/ Medical Decision Making/ A&P                              Medical Decision Making Amount and/or Complexity of Data Reviewed Radiology: ordered.   67 y.o. female presents to the ED for concern of Fall   Past Medical History / Co-morbidities / Social History: Hx of depression, osteopenia Social Determinants of Health include: Geriatric  Additional History:  None  Lab Tests: None  Imaging Studies: I ordered imaging studies including CT head, c-spine, t-spine, and XR thoracic.   I independently visualized and interpreted imaging which showed no acute bony pathology I agree with the radiologist interpretation.  ED Course: Pt well-appearing on exam.  Patient with head injury from mechanical injury which did not cause of LOC.  Also with midline and paraspinal C/T spine tenderness on exam.  No evidence of skull fracture or hematoma on physical exam.  Not taking anticoagulants and has no hx of subarachnoid or subdural hemorrhage, though is 67 years of age.  Patient denies N/V, amnesia, vision changes, cognitive or memory dysfunction and vertigo.  Patient with no focal neurological deficits on exam as described above.   Initial thoracic x-ray suggestive of possible fracture.  Further analysis with CT negative for fracture or acute bony abnormality.  CT head and C-spine also negative.  C-collar removed.  Pt neck supple without torticollis.  Gait intact.  Still remains hemodynamically stable.  Clinical diagnosis of likely mild concussion.   Plan for discharge with information pertaining to diagnosis.  Recommended rest and use of OTC medications like NSAIDs and Tylenol for pain relief.  Advised to not participate in contact sports or vigorous activities until they are completely asymptomatic for at least 1 week or they are cleared by their primary doctor.  Recommend PCP follow up in 1 week.  Strict return precautions discussed at length.  Pt reports satisfaction with today's encounter.  Patient in NAD and in good condition at time of  discharge.  Disposition: After consideration the patient's encounter today, I do not feel today's workup suggests an emergent condition requiring admission or immediate intervention beyond what has been performed at this time.  Safe for discharge; instructed to return immediately for worsening symptoms, change in symptoms or any other concerns.  I have reviewed the patients home medicines and have made adjustments as needed.  Discussed course of treatment with the patient, whom demonstrated understanding.  Patient in agreement and has no further questions.    I discussed this case with my attending physician Dr. Kathrynn Humble, who agreed with the proposed treatment course and cosigned this note.  Attending physician stated agreement with plan or made changes to plan which were implemented.    This chart was dictated using voice recognition software.  Despite best efforts to proofread, errors can occur which can change the documentation meaning.         Final Clinical Impression(s) / ED Diagnoses Final diagnoses:  Fall, initial encounter  Injury of head, initial encounter  Acute neck pain    Rx / DC Orders ED Discharge Orders     None         Candace Cruise 44/03/47 2234    Varney Biles, MD 07/30/22 [id number]

## 2022-07-30 NOTE — Discharge Instructions (Addendum)
You were evaluated in the emergency department due to head injury/neck injury following a fall earlier today.  Imaging did not show any acute abnormalities such as fracture or dislocation.  Please continue to rest, as you likely suffered a mild concussion.  Refrain from participating in contact sports or such activities for at least 1 to 2 weeks following resolution of symptoms.  You may follow-up with your PCP in 1 week for reevaluation and continued medical management.  Return to the ED for new or worsening symptoms as discussed.

## 2022-07-30 NOTE — ED Provider Triage Note (Signed)
Emergency Medicine Provider Triage Evaluation Note  Sheri Adkins , a 67 y.o. female  was evaluated in triage.  Pt complains of head, neck, and upper back pain following collision injury.  Patient was playing pickle ball, bumped into another player, and fell backwards hitting her head on the hard pavement.  Denies LOC, dizziness, N/V.  Denies numbness or tingling of the extremities.  Review of Systems  Positive:  Negative: See above  Physical Exam  BP 117/63 (BP Location: Left Arm)   Pulse 72   Temp 98.5 F (36.9 C) (Oral)   Resp 17   SpO2 96%  Gen:   Awake, no distress   Resp:  Normal effort  MSK:   Moves extremities without difficulty  Other:  Presents in c-collar.  Midline C-spine tenderness along with mild posterior occipital tenderness.  Able to wiggle fingers and toes.  Extremities appear neurovascularly grossly intact.  Medical Decision Making  Medically screening exam initiated at 2:27 PM.  Appropriate orders placed.  Sheri Adkins was informed that the remainder of the evaluation will be completed by another provider, this initial triage assessment does not replace that evaluation, and the importance of remaining in the ED until their evaluation is complete.     Prince Rome, PA-C 28/41/32 1429

## 2022-07-30 NOTE — ED Triage Notes (Signed)
BIBA from [facility] with mechanical fall while playing pickle ball. Pt fell backwards falling on back and hitting head on floor. Denies loc and blood thinner usage.  Pt complain of neck pain. C-collar in place

## 2022-07-31 NOTE — Progress Notes (Signed)
Patient presents today to pick up custom molded foot orthotics recommended by Dr. Milinda Pointer.   Orthotics were dispensed and fit was satisfactory. Reviewed instructions for break-in and wear. Written instructions given to patient.  Patient will follow up as needed.   [facility] - order # [id number]

## 2022-08-01 ENCOUNTER — Ambulatory Visit (INDEPENDENT_AMBULATORY_CARE_PROVIDER_SITE_OTHER): Payer: Medicare Other

## 2022-08-01 DIAGNOSIS — M778 Other enthesopathies, not elsewhere classified: Secondary | ICD-10-CM

## 2022-08-01 DIAGNOSIS — M2011 Hallux valgus (acquired), right foot: Secondary | ICD-10-CM

## 2022-08-01 DIAGNOSIS — Q667 Congenital pes cavus, unspecified foot: Secondary | ICD-10-CM

## 2022-08-21 MED ORDER — FLUTICASONE PROPIONATE 50 MCG/ACT NA SUSP: 2.0000 | Freq: Every day | NASAL | 1 refills | Status: DC

## 2022-08-21 NOTE — Addendum Note (Signed)
Addended by: Rafael Bihari A on: 08/21/2022 10:34 AM   Modules accepted: Orders

## 2022-09-13 ENCOUNTER — Other Ambulatory Visit: Payer: Self-pay | Admitting: Family

## 2022-09-13 DIAGNOSIS — R0981 Nasal congestion: Secondary | ICD-10-CM

## 2022-09-16 ENCOUNTER — Ambulatory Visit (INDEPENDENT_AMBULATORY_CARE_PROVIDER_SITE_OTHER): Payer: Medicare Other | Admitting: Family

## 2022-09-16 ENCOUNTER — Encounter: Payer: Self-pay | Admitting: Family

## 2022-09-16 VITALS — BP 118/72 | HR 67 | Temp 98.2°F | Ht 63.5 in | Wt 141.8 lb

## 2022-09-16 DIAGNOSIS — E039 Hypothyroidism, unspecified: Secondary | ICD-10-CM

## 2022-09-16 DIAGNOSIS — E782 Mixed hyperlipidemia: Secondary | ICD-10-CM | POA: Diagnosis not present

## 2022-09-16 DIAGNOSIS — F3342 Major depressive disorder, recurrent, in full remission: Secondary | ICD-10-CM

## 2022-09-16 MED ORDER — LEVOTHYROXINE SODIUM 75 MCG PO TABS: 75.0000 ug | ORAL_TABLET | Freq: Every day | ORAL | 1 refills | Status: DC

## 2022-09-16 MED ORDER — PAROXETINE HCL 40 MG PO TABS: 40.0000 mg | ORAL_TABLET | Freq: Every day | ORAL | 1 refills | Status: DC

## 2022-09-16 NOTE — Progress Notes (Signed)
Provider: Marlowe Sax FNP-C   Yoshie Kosel, Nelda Bucks, NP  Patient Care Team: Mayfield Schoene, Nelda Bucks, NP as PCP - General (Family Medicine)  Extended Emergency Contact Information Primary Emergency Contact: Haglund,Janet  [place] Mobile Phone: [phone] Relation: Sister  Code Status:  Full code  Goals of care: Advanced Directive information    09/16/2022    9:07 AM  Advanced Directives  Does Patient Have a Medical Advance Directive? Yes  Type of Advance Directive Out of facility DNR (pink MOST or yellow form)  Does patient want to make changes to medical advance directive? No - Patient declined     Chief Complaint  Patient presents with   Medical Management of Chronic Issues    6 month follow up   Immunizations    Discussed the need for Pneumonia vaccine after 09/29/2022 and also updated covid vaccine.   Quality Metric Gaps    Discussed the need for AWV, Mammogram and Colonoscopy    HPI:  Pt is a 67 y.o. female seen today for 6 months follow up for medical management of chronic diseases.  Has not require Imitrex often since she retired. Thinks migraines were due to stress level.  Due for  Pneumonia vaccine after 09/29/2022.Also due for COVID-19 vaccine.Aware to get vaccine at the pharmacy.  Due for mammogram.denies any symptoms of breast cancer.  Also due for screening colon cancer. Denies any abdominal pain or blood in the stool.    Past Medical History:  Diagnosis Date   Allergy    Arthritis    Depression    H/O mammogram 2021   History of Papanicolaou smear of cervix 2020   Migraine    Thyroid disease    History reviewed. No pertinent surgical history.  Allergies  Allergen Reactions   Penicillins Rash    Has patient had a PCN reaction causing immediate rash, facial/tongue/throat swelling, SOB or lightheadedness with hypotension: Yes  Has patient had a PCN reaction causing severe rash involving mucus membranes or skin necrosis: No  Has  patient had a PCN reaction that required hospitalization No  Has patient had a PCN reaction occurring within the last 10 years: No  If all of the above answers are "NO", then may proceed with Cephalosporin use.  Has patient had a PCN reaction causing immediate rash, facial/tongue/throat swelling, SOB or lightheadedness with hypotension: Yes Has patient had a PCN reaction causing severe rash involving mucus membranes or skin necrosis: No Has patient had a PCN reaction that required hospitalization No Has patient had a PCN reaction occurring within the last 10 years: No If all of the above answers are "NO", then may proceed with Cephalosporin use.   Sulfa Antibiotics Rash    Rash    Allergies as of 09/16/2022       Reactions   Penicillins Rash   Has patient had a PCN reaction causing immediate rash, facial/tongue/throat swelling, SOB or lightheadedness with hypotension: Yes Has patient had a PCN reaction causing severe rash involving mucus membranes or skin necrosis: No Has patient had a PCN reaction that required hospitalization No Has patient had a PCN reaction occurring within the last 10 years: No If all of the above answers are "NO", then may proceed with Cephalosporin use. Has patient had a PCN reaction causing immediate rash, facial/tongue/throat swelling, SOB or lightheadedness with hypotension: Yes Has patient had a PCN reaction causing severe rash involving mucus membranes or skin necrosis: No Has patient had a PCN reaction that required hospitalization  No Has patient had a PCN reaction occurring within the last 10 years: No If all of the above answers are "NO", then may proceed with Cephalosporin use.   Sulfa Antibiotics Rash   Rash        Medication List        Accurate as of September 16, 2022  9:13 AM. If you have any questions, ask your nurse or doctor.          fluticasone 50 MCG/ACT nasal spray Commonly known as: FLONASE SPRAY 2 SPRAYS INTO EACH NOSTRIL EVERY DAY    IBUPROFEN PO Take 1 tablet by mouth as needed.   levothyroxine 75 MCG tablet Commonly known as: SYNTHROID Take 75 mcg by mouth daily before breakfast.   MULTIVITAMIN PO Take 1 capsule by mouth daily.   PARoxetine 40 MG tablet Commonly known as: PAXIL TAKE 1 TABLET DAILY   SUMAtriptan 25 MG tablet Commonly known as: Imitrex Take 1 tablet (25 mg total) by mouth every 2 (two) hours as needed for migraine. May repeat in 2 hours if headache persists or recurs.   TYLENOL PO Take 2 capsules by mouth as needed.        Review of Systems  Constitutional:  Negative for appetite change, chills, fatigue, fever and unexpected weight change.  HENT:  Negative for congestion, dental problem, ear discharge, ear pain, facial swelling, hearing loss, nosebleeds, postnasal drip, rhinorrhea, sinus pressure, sinus pain, sneezing, sore throat, tinnitus and trouble swallowing.   Eyes:  Positive for visual disturbance. Negative for pain, discharge, redness and itching.       Wears eye glasses  Respiratory:  Negative for cough, chest tightness, shortness of breath and wheezing.   Cardiovascular:  Negative for chest pain, palpitations and leg swelling.  Gastrointestinal:  Negative for abdominal distention, abdominal pain, blood in stool, constipation, diarrhea, nausea and vomiting.  Endocrine: Negative for cold intolerance, heat intolerance, polydipsia, polyphagia and polyuria.  Genitourinary:  Negative for difficulty urinating, dysuria, flank pain, frequency and urgency.  Musculoskeletal:  Negative for arthralgias, back pain, gait problem, joint swelling, myalgias, neck pain and neck stiffness.  Skin:  Negative for color change, pallor, rash and wound.  Neurological:  Negative for dizziness, syncope, speech difficulty, weakness, light-headedness, numbness and headaches.  Hematological:  Does not bruise/bleed easily.  Psychiatric/Behavioral:  Negative for agitation, behavioral problems, confusion,  hallucinations, self-injury, sleep disturbance and suicidal ideas. The patient is not nervous/anxious.        Sleeps 9 hrs at night     Immunization History  Administered Date(s) Administered   Fluad Quad(high Dose 65+) 04/01/2022   Influenza Inj Mdck Quad With Preservative 05/13/2018   Influenza,inj,Quad PF,6+ Mos 04/14/2017   Influenza-Unspecified 04/18/2014, 05/02/2015, 04/23/2020   PFIZER(Purple Top)SARS-COV-2 Vaccination 09/23/2019, 10/13/2019, 07/02/2020   Pneumococcal Conjugate-13 09/28/2021   Tdap 07/16/2011, 10/05/2021   Zoster Recombinat (Shingrix) 10/05/2021, 12/17/2021   Pertinent  Health Maintenance Due  Topic Date Due   COLONOSCOPY (Pts 45-54yr Insurance coverage will need to be confirmed)  Never done   MAMMOGRAM  11/01/2021   INFLUENZA VACCINE  Completed   DEXA SCAN  Completed      09/12/2021    3:30 PM 09/28/2021    8:45 AM 10/29/2021    1:07 PM 04/01/2022    9:07 AM 09/16/2022    9:08 AM  Fall Risk  Falls in the past year? 0 0 0 0 1  Was there an injury with Fall? 0 0 0 0 1  Fall Risk Category Calculator  0 0 0 0 2  Fall Risk Category (Retired) Low Low Low Low   (RETIRED) Patient Fall Risk Level Low fall risk Low fall risk Low fall risk Low fall risk   Patient at Risk for Falls Due to No Fall Risks No Fall Risks No Fall Risks No Fall Risks   Fall risk Follow up Falls evaluation completed Falls evaluation completed Falls evaluation completed Falls evaluation completed    Functional Status Survey:    Vitals:   09/16/22 0908  BP: 118/72  Pulse: 67  Temp: 98.2 F (36.8 C)  SpO2: 98%  Weight: 141 lb 12.8 oz (64.3 kg)  Height: 5' 3.5" (1.613 m)   Body mass index is 24.72 kg/m. Physical Exam Vitals reviewed.  Constitutional:      General: She is not in acute distress.    Appearance: Normal appearance. She is normal weight. She is not ill-appearing or diaphoretic.  HENT:     Head: Normocephalic.     Right Ear: Tympanic membrane, ear canal and external ear  normal. There is no impacted cerumen.     Left Ear: Tympanic membrane, ear canal and external ear normal. There is no impacted cerumen.     Nose: Nose normal. No congestion or rhinorrhea.     Mouth/Throat:     Mouth: Mucous membranes are moist.     Pharynx: Oropharynx is clear. No oropharyngeal exudate or posterior oropharyngeal erythema.  Eyes:     General: No scleral icterus.       Right eye: No discharge.        Left eye: No discharge.     Extraocular Movements: Extraocular movements intact.     Conjunctiva/sclera: Conjunctivae normal.     Pupils: Pupils are equal, round, and reactive to light.  Neck:     Vascular: No carotid bruit.  Cardiovascular:     Rate and Rhythm: Normal rate and regular rhythm.     Pulses: Normal pulses.     Heart sounds: Normal heart sounds. No murmur heard.    No friction rub. No gallop.  Pulmonary:     Effort: Pulmonary effort is normal. No respiratory distress.     Breath sounds: Normal breath sounds. No wheezing, rhonchi or rales.  Chest:     Chest wall: No tenderness.  Abdominal:     General: Bowel sounds are normal. There is no distension.     Palpations: Abdomen is soft. There is no mass.     Tenderness: There is no abdominal tenderness. There is no right CVA tenderness, left CVA tenderness, guarding or rebound.  Musculoskeletal:        General: No swelling or tenderness. Normal range of motion.     Cervical back: Normal range of motion. No rigidity or tenderness.     Right lower leg: No edema.     Left lower leg: No edema.  Lymphadenopathy:     Cervical: No cervical adenopathy.  Skin:    General: Skin is warm and dry.     Coloration: Skin is not pale.     Findings: No bruising, erythema, lesion or rash.  Neurological:     Mental Status: She is alert and oriented to person, place, and time.     Cranial Nerves: No cranial nerve deficit.     Sensory: No sensory deficit.     Motor: No weakness.     Coordination: Coordination normal.      Gait: Gait normal.  Psychiatric:  Mood and Affect: Mood normal.        Speech: Speech normal.        Behavior: Behavior normal.        Thought Content: Thought content normal.        Judgment: Judgment normal.     Labs reviewed: Recent Labs    09/28/21 0952  NA 139  K 4.3  CL 103  CO2 27  GLUCOSE 91  BUN 8  CREATININE 0.61  CALCIUM 9.3   Recent Labs    09/28/21 0952  AST 24  ALT 26  BILITOT 0.6  PROT 6.3   Recent Labs    09/28/21 0952  WBC 4.7  NEUTROABS 2,063  HGB 13.3  HCT 39.5  MCV 92.3  PLT 299   Lab Results  Component Value Date   TSH 3.18 07/26/2022   No results found for: "HGBA1C" Lab Results  Component Value Date   CHOL 203 (H) 09/28/2021   HDL 67 09/28/2021   LDLCALC 114 (H) 09/28/2021   LDLDIRECT 151 (H) 11/02/2013   TRIG 109 09/28/2021   CHOLHDL 3.0 09/28/2021    Significant Diagnostic Results in last 30 days:  No results found.  Assessment/Plan  1. Recurrent major depressive disorder, in full remission ([place]) Mood stable  - continue on Paxil - PARoxetine (PAXIL) 40 MG tablet; Take 1 tablet (40 mg total) by mouth daily.  Dispense: 90 tablet; Refill: 1 - COMPLETE METABOLIC PANEL WITH GFR - CBC with Differential/Platelet  2. Mixed hyperlipidemia Previous LDL 114  - Lipid panel  3. Acquired hypothyroidism Lab Results  Component Value Date   TSH 3.18 07/26/2022  - continue on levothyroxine  - levothyroxine (SYNTHROID) 75 MCG tablet; Take 1 tablet (75 mcg total) by mouth daily before breakfast.  Dispense: 90 tablet; Refill: 1 - TSH  Family/ staff Communication: Reviewed plan of care with patient verbalized understanding   Labs/tests ordered:  - COMPLETE METABOLIC PANEL WITH GFR - CBC with Differential/Platelet - TSH - Lipid panel  Next Appointment : Return in about 6 months (around 03/19/2023) for medical mangement of chronic issues.Sandrea Hughs, NP

## 2022-09-17 LAB — LIPID PANEL
Cholesterol: 311 mg/dL — ABNORMAL HIGH (ref <200)
HDL: 97 mg/dL (ref >=50)
LDL Cholesterol (Calc): 195 mg/dL (calc) — ABNORMAL HIGH
Non-HDL Cholesterol (Calc): 214 mg/dL (calc) — ABNORMAL HIGH (ref <130)
Total CHOL/HDL Ratio: 3.2 (calc) (ref <5.0)
Triglycerides: 84 mg/dL (ref <150)

## 2022-09-17 LAB — CBC WITH DIFFERENTIAL/PLATELET
Absolute Monocytes: 428 cells/uL (ref 200–950)
Basophils Absolute: 50 cells/uL (ref 0–200)
Basophils Relative: 1.1 %
Eosinophils Absolute: 158 cells/uL (ref 15–500)
Eosinophils Relative: 3.5 %
HCT: 38.7 % (ref 35.0–45.0)
Hemoglobin: 12.8 g/dL (ref 11.7–15.5)
Lymphs Abs: 2205 cells/uL (ref 850–3900)
MCH: 31 pg (ref 27.0–33.0)
MCHC: 33.1 g/dL (ref 32.0–36.0)
MCV: 93.7 fL (ref 80.0–100.0)
MPV: 9.4 fL (ref 7.5–12.5)
Monocytes Relative: 9.5 %
Neutro Abs: 1661 cells/uL (ref 1500–7800)
Neutrophils Relative %: 36.9 %
Platelets: 278 10*3/uL (ref 140–400)
RBC: 4.13 10*6/uL (ref 3.80–5.10)
RDW: 12.3 % (ref 11.0–15.0)
Total Lymphocyte: 49 %
WBC: 4.5 10*3/uL (ref 3.8–10.8)

## 2022-09-17 LAB — COMPLETE METABOLIC PANEL WITH GFR
AG Ratio: 2 (calc) (ref 1.0–2.5)
ALT: 31 U/L — ABNORMAL HIGH (ref 6–29)
AST: 30 U/L (ref 10–35)
Albumin: 4.4 g/dL (ref 3.6–5.1)
Alkaline phosphatase (APISO): 70 U/L (ref 37–153)
BUN: 8 mg/dL (ref 7–25)
CO2: 28 mmol/L (ref 20–32)
Calcium: 9 mg/dL (ref 8.6–10.4)
Chloride: 102 mmol/L (ref 98–110)
Creat: 0.68 mg/dL (ref 0.50–1.05)
Globulin: 2.2 g/dL (calc) (ref 1.9–3.7)
Glucose, Bld: 85 mg/dL (ref 65–99)
Potassium: 4.4 mmol/L (ref 3.5–5.3)
Sodium: 138 mmol/L (ref 135–146)
Total Bilirubin: 0.5 mg/dL (ref 0.2–1.2)
Total Protein: 6.6 g/dL (ref 6.1–8.1)
eGFR: 96 mL/min/{1.73_m2} (ref >=60)

## 2022-09-17 LAB — TSH: TSH: 6 mIU/L — ABNORMAL HIGH (ref 0.40–4.50)

## 2022-09-26 DIAGNOSIS — E782 Mixed hyperlipidemia: Secondary | ICD-10-CM

## 2022-09-27 MED ORDER — ATORVASTATIN CALCIUM 10 MG PO TABS: 10.0000 mg | ORAL_TABLET | Freq: Every day | ORAL | 3 refills | Status: DC

## 2022-09-27 NOTE — Telephone Encounter (Signed)
Will send Atorvastatin 10 mg tablet one by mouth daily  Please schedule appointment in 2 month to recheck TSH and Hepatic panel

## 2022-10-03 ENCOUNTER — Other Ambulatory Visit: Payer: Self-pay

## 2022-10-03 ENCOUNTER — Other Ambulatory Visit: Payer: Self-pay | Admitting: Family

## 2022-10-03 DIAGNOSIS — E039 Hypothyroidism, unspecified: Secondary | ICD-10-CM

## 2022-10-03 DIAGNOSIS — E782 Mixed hyperlipidemia: Secondary | ICD-10-CM

## 2022-10-03 MED ORDER — ATORVASTATIN CALCIUM 10 MG PO TABS: 10.0000 mg | ORAL_TABLET | Freq: Every day | ORAL | 0 refills | Status: DC

## 2022-10-10 ENCOUNTER — Other Ambulatory Visit: Payer: Self-pay

## 2022-10-10 ENCOUNTER — Other Ambulatory Visit: Payer: Self-pay | Admitting: Family

## 2022-10-10 DIAGNOSIS — E782 Mixed hyperlipidemia: Secondary | ICD-10-CM

## 2022-10-10 DIAGNOSIS — F3342 Major depressive disorder, recurrent, in full remission: Secondary | ICD-10-CM

## 2022-10-10 MED ORDER — PAROXETINE HCL 40 MG PO TABS: 40.0000 mg | ORAL_TABLET | Freq: Every day | ORAL | 1 refills | Status: DC

## 2022-10-10 NOTE — Telephone Encounter (Signed)
Patient request refill on medication be sent to [facility] [address].  High warning came up when trying to send to pharmacy.  Medication pended and sent to Marlowe Sax, NP

## 2022-10-10 NOTE — Telephone Encounter (Deleted)
Patient needs refill sent to [facility] [address]

## 2022-10-17 ENCOUNTER — Encounter: Payer: Self-pay | Admitting: Family

## 2022-10-17 ENCOUNTER — Ambulatory Visit (INDEPENDENT_AMBULATORY_CARE_PROVIDER_SITE_OTHER): Payer: Medicare Other | Admitting: Family

## 2022-10-17 DIAGNOSIS — Z1211 Encounter for screening for malignant neoplasm of colon: Secondary | ICD-10-CM | POA: Diagnosis not present

## 2022-10-17 DIAGNOSIS — M85851 Other specified disorders of bone density and structure, right thigh: Secondary | ICD-10-CM

## 2022-10-17 DIAGNOSIS — Z1231 Encounter for screening mammogram for malignant neoplasm of breast: Secondary | ICD-10-CM

## 2022-10-17 DIAGNOSIS — Z0000 Encounter for general adult medical examination without abnormal findings: Secondary | ICD-10-CM

## 2022-10-17 NOTE — Patient Instructions (Signed)
Sheri Adkins , Thank you for taking time to come for your Medicare Wellness Visit. I appreciate your ongoing commitment to your health goals. Please review the following plan we discussed and let me know if I can assist you in the future.   Screening recommendations/referrals: Colonoscopy : Referral to Gastroenterologist ordered  Mammogram : Ordered today  Bone Density : Ordered today  Recommended yearly ophthalmology/optometry visit for glaucoma screening and checkup Recommended yearly dental visit for hygiene and checkup  Vaccinations: Influenza vaccine- due annually in September/October Pneumococcal vaccine : Due  Tdap vaccine : Up to date  Shingles vaccine : Up to date     Advanced directives: No   Conditions/risks identified:  advanced age (>49men, >48 women);dyslipidemia  Next appointment: 1 year    Preventive Care 15 Years and Older, Female Preventive care refers to lifestyle choices and visits with your health care provider that can promote health and wellness. What does preventive care include? A yearly physical exam. This is also called an annual well check. Dental exams once or twice a year. Routine eye exams. Ask your health care provider how often you should have your eyes checked. Personal lifestyle choices, including: Daily care of your teeth and gums. Regular physical activity. Eating a healthy diet. Avoiding tobacco and drug use. Limiting alcohol use. Practicing safe sex. Taking low-dose aspirin every day. Taking vitamin and mineral supplements as recommended by your health care provider. What happens during an annual well check? The services and screenings done by your health care provider during your annual well check will depend on your age, overall health, lifestyle risk factors, and family history of disease. Counseling  Your health care provider may ask you questions about your: Alcohol use. Tobacco use. Drug use. Emotional well-being. Home and  relationship well-being. Sexual activity. Eating habits. History of falls. Memory and ability to understand (cognition). Work and work Statistician. Reproductive health. Screening  You may have the following tests or measurements: Height, weight, and BMI. Blood pressure. Lipid and cholesterol levels. These may be checked every 5 years, or more frequently if you are over 72 years old. Skin check. Lung cancer screening. You may have this screening every year starting at age 48 if you have a 30-pack-year history of smoking and currently smoke or have quit within the past 15 years. Fecal occult blood test (FOBT) of the stool. You may have this test every year starting at age 72. Flexible sigmoidoscopy or colonoscopy. You may have a sigmoidoscopy every 5 years or a colonoscopy every 10 years starting at age 42. Hepatitis C blood test. Hepatitis B blood test. Sexually transmitted disease (STD) testing. Diabetes screening. This is done by checking your blood sugar (glucose) after you have not eaten for a while (fasting). You may have this done every 1-3 years. Bone density scan. This is done to screen for osteoporosis. You may have this done starting at age 39. Mammogram. This may be done every 1-2 years. Talk to your health care provider about how often you should have regular mammograms. Talk with your health care provider about your test results, treatment options, and if necessary, the need for more tests. Vaccines  Your health care provider may recommend certain vaccines, such as: Influenza vaccine. This is recommended every year. Tetanus, diphtheria, and acellular pertussis (Tdap, Td) vaccine. You may need a Td booster every 10 years. Zoster vaccine. You may need this after age 75. Pneumococcal 13-valent conjugate (PCV13) vaccine. One dose is recommended after age 6. Pneumococcal polysaccharide (  PPSV23) vaccine. One dose is recommended after age 28. Talk to your health care provider  about which screenings and vaccines you need and how often you need them. This information is not intended to replace advice given to you by your health care provider. Make sure you discuss any questions you have with your health care provider. Document Released: 07/28/2015 Document Revised: 03/20/2016 Document Reviewed: 05/02/2015 Elsevier Interactive Patient Education  2017 [place] Prevention in the Home Falls can cause injuries. They can happen to people of all ages. There are many things you can do to make your home safe and to help prevent falls. What can I do on the outside of my home? Regularly fix the edges of walkways and driveways and fix any cracks. Remove anything that might make you trip as you walk through a door, such as a raised step or threshold. Trim any bushes or trees on the path to your home. Use bright outdoor lighting. Clear any walking paths of anything that might make someone trip, such as rocks or tools. Regularly check to see if handrails are loose or broken. Make sure that both sides of any steps have handrails. Any raised decks and porches should have guardrails on the edges. Have any leaves, snow, or ice cleared regularly. Use sand or salt on walking paths during winter. Clean up any spills in your garage right away. This includes oil or grease spills. What can I do in the bathroom? Use night lights. Install grab bars by the toilet and in the tub and shower. Do not use towel bars as grab bars. Use non-skid mats or decals in the tub or shower. If you need to sit down in the shower, use a plastic, non-slip stool. Keep the floor dry. Clean up any water that spills on the floor as soon as it happens. Remove soap buildup in the tub or shower regularly. Attach bath mats securely with double-sided non-slip rug tape. Do not have throw rugs and other things on the floor that can make you trip. What can I do in the bedroom? Use night lights. Make sure  that you have a light by your bed that is easy to reach. Do not use any sheets or blankets that are too big for your bed. They should not hang down onto the floor. Have a firm chair that has side arms. You can use this for support while you get dressed. Do not have throw rugs and other things on the floor that can make you trip. What can I do in the kitchen? Clean up any spills right away. Avoid walking on wet floors. Keep items that you use a lot in easy-to-reach places. If you need to reach something above you, use a strong step stool that has a grab bar. Keep electrical cords out of the way. Do not use floor polish or wax that makes floors slippery. If you must use wax, use non-skid floor wax. Do not have throw rugs and other things on the floor that can make you trip. What can I do with my stairs? Do not leave any items on the stairs. Make sure that there are handrails on both sides of the stairs and use them. Fix handrails that are broken or loose. Make sure that handrails are as long as the stairways. Check any carpeting to make sure that it is firmly attached to the stairs. Fix any carpet that is loose or worn. Avoid having throw rugs at the top or  bottom of the stairs. If you do have throw rugs, attach them to the floor with carpet tape. Make sure that you have a light switch at the top of the stairs and the bottom of the stairs. If you do not have them, ask someone to add them for you. What else can I do to help prevent falls? Wear shoes that: Do not have high heels. Have rubber bottoms. Are comfortable and fit you well. Are closed at the toe. Do not wear sandals. If you use a stepladder: Make sure that it is fully opened. Do not climb a closed stepladder. Make sure that both sides of the stepladder are locked into place. Ask someone to hold it for you, if possible. Clearly mark and make sure that you can see: Any grab bars or handrails. First and last steps. Where the edge of  each step is. Use tools that help you move around (mobility aids) if they are needed. These include: Canes. Walkers. Scooters. Crutches. Turn on the lights when you go into a dark area. Replace any light bulbs as soon as they burn out. Set up your furniture so you have a clear path. Avoid moving your furniture around. If any of your floors are uneven, fix them. If there are any pets around you, be aware of where they are. Review your medicines with your doctor. Some medicines can make you feel dizzy. This can increase your chance of falling. Ask your doctor what other things that you can do to help prevent falls. This information is not intended to replace advice given to you by your health care provider. Make sure you discuss any questions you have with your health care provider. Document Released: 04/27/2009 Document Revised: 12/07/2015 Document Reviewed: 08/05/2014 Elsevier Interactive Patient Education  2017 [employer].

## 2022-10-17 NOTE — Progress Notes (Addendum)
Subjective:   Sheri Adkins is a 67 y.o. female who presents for Medicare Annual (Subsequent) preventive examination.  Review of Systems     Cardiac Risk Factors include: advanced age (>30men, >15 women);dyslipidemia     Objective:    There were no vitals filed for this visit. There is no height or weight on file to calculate BMI.     10/17/2022    2:46 PM 09/16/2022    9:07 AM 07/30/2022    2:35 PM 04/22/2022    3:04 PM 04/01/2022    9:07 AM 11/22/2021    1:21 PM 10/29/2021    1:07 PM  Advanced Directives  Does Patient Have a Medical Advance Directive? No Yes No No No No No  Type of Advance Directive  Out of facility DNR (pink MOST or yellow form)       Does patient want to make changes to medical advance directive? No - Patient declined No - Patient declined       Would patient like information on creating a medical advance directive?   No - Patient declined  No - Patient declined No - Patient declined No - Patient declined    Current Medications (verified) Outpatient Encounter Medications as of 10/17/2022  Medication Sig   Acetaminophen (TYLENOL PO) Take 2 capsules by mouth as needed.   atorvastatin (LIPITOR) 10 MG tablet Take 1 tablet (10 mg total) by mouth daily.   fluticasone (FLONASE) 50 MCG/ACT nasal spray SPRAY 2 SPRAYS INTO EACH NOSTRIL EVERY DAY   IBUPROFEN PO Take 1 tablet by mouth as needed.   levothyroxine (SYNTHROID) 75 MCG tablet TAKE 1 TABLET BY MOUTH EVERY DAY   Multiple Vitamin (MULTIVITAMIN PO) Take 1 capsule by mouth daily.   PARoxetine (PAXIL) 40 MG tablet Take 1 tablet (40 mg total) by mouth daily.   SUMAtriptan (IMITREX) 25 MG tablet Take 1 tablet (25 mg total) by mouth every 2 (two) hours as needed for migraine. May repeat in 2 hours if headache persists or recurs.   No facility-administered encounter medications on file as of 10/17/2022.    Allergies (verified) Penicillins and Sulfa antibiotics   History: Past Medical History:  Diagnosis Date    Allergy    Arthritis    Depression    H/O mammogram 2021   History of Papanicolaou smear of cervix 2020   Migraine    Thyroid disease    History reviewed. No pertinent surgical history. Family History  Problem Relation Age of Onset   Hypertension Mother    Stroke Mother    Mental illness Mother    Cancer Sister        ovarian cancer   Mental illness Sister    Heart disease Paternal Grandfather    Mental illness Brother    Cancer Maternal Grandmother    Mental illness Maternal Grandmother    Mental illness Brother    Mental illness Sister    Social History   Socioeconomic History   Marital status: Single    Spouse name: Not on file   Number of children: Not on file   Years of education: Not on file   Highest education level: Not on file  Occupational History   Occupation: Probation officer    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   Diet:   Do you drink/eat things with caffeine: Yes   Marital status: Divorced                                  What year were you married? 1983   Do you live in a house, apartment, assisted living, condo, trailer, etc.? House    Is it one or more stories? Yes   How many persons live in your home? 3   Do you have pets in your home?( please list) Cat   Highest Level of education completed? Ph.D   Current or past profession: [employer] you exercise?   Yes                               Type and how often?  Walking 1-2 times monthly.    Do you have a living will? No   Do you have a DNR form? No                                  If not, do you want to discuss one?   Do you have signed POA/HPOA forms? No                       If so, please bring to you appointment      Do you have any difficulty bathing or dressing yourself?    Do you have any difficulty preparing food or eating?   Do you have any difficulty managing your medications?   Do you have any difficulty managing your finances?   Do you have any difficulty affording your medications?    Social Determinants of Health   Financial Resource Strain: Not on file  Food Insecurity: Not on file  Transportation Needs: Not on file  Physical Activity: Not on file  Stress: Not on file  Social Connections: Not on file    Tobacco Counseling Counseling given: Not Answered   Clinical Intake:  Pre-visit preparation completed: No        BMI - recorded: 24.72 Nutritional Status: BMI of 19-24  Normal Nutritional Risks: None Diabetes: No  How often do you need to have someone help you when you read instructions, pamphlets, or other written materials from your doctor or pharmacy?: 1 - Never What is the last grade level you completed in school?: Phd Degree  Diabetic?No   Interpreter Needed?: No   Activities of Daily Living    10/17/2022    2:46 PM  In your present state of health, do you have any difficulty performing the following activities:  Hearing? 0  Vision? 0  Difficulty concentrating or making decisions? 0  Walking or climbing stairs? 0  Dressing or bathing? 0  Doing errands, shopping? 0  Preparing Food and eating ?  N  Using the Toilet? N  In the past six months, have you accidently leaked urine? N  Do you have problems with loss of bowel control? N  Managing your Medications? N  Managing your Finances? N  Housekeeping or managing your Housekeeping? N    Patient Care Team: Jariya Reichow, [name], NP as PCP - General  (Family Medicine)  Indicate any recent Medical Services you may have received from other than [facility] providers in the past year (date may be approximate).     Assessment:   This is a routine wellness examination for [name].  Hearing/Vision screen No results found.  Dietary issues and exercise activities discussed: Current Exercise Habits: Home exercise routine, Type of exercise: walking (pickle ball), Time (Minutes): 60, Frequency (Times/Week): 3, Weekly Exercise (Minutes/Week): 180, Intensity: Mild, Exercise limited by: None identified   Goals Addressed             This Visit's Progress    Patient Stated       Would like to remain independent as much as she can        Depression Screen    10/17/2022    2:45 PM 09/16/2022    9:08 AM 11/22/2021    2:17 PM 05/02/2020    8:11 AM 10/25/2019   10:14 AM 04/30/2019   12:04 PM 03/25/2018    9:11 AM  PHQ 2/9 Scores  PHQ - 2 Score 0 0 0 0 0 0 0  PHQ- 9 Score 0          Fall Risk    10/17/2022    2:46 PM 09/16/2022    9:08 AM 04/01/2022    9:07 AM 10/29/2021    1:07 PM 09/28/2021    8:45 AM  Fall Risk   Falls in the past year? 1 1 0 0 0  Number falls in past yr: 0 0 0 0 0  Injury with Fall? 1 1 0 0 0  Risk for fall due to :   No Fall Risks No Fall Risks No Fall Risks  Follow up Falls evaluation completed;Education provided;Falls prevention discussed  Falls evaluation completed Falls evaluation completed Falls evaluation completed    FALL RISK PREVENTION PERTAINING TO THE HOME:  Any stairs in or around the home? Yes  If so, are there any without handrails? No  Home free of loose throw rugs in walkways, pet beds, electrical cords, etc? No  Adequate lighting in your home to reduce risk of falls? No   ASSISTIVE DEVICES UTILIZED TO PREVENT FALLS:  Life alert? No  Use of a cane, walker or w/c? No  Grab bars in the bathroom? No  Shower chair or bench in shower? No  Elevated toilet seat or a handicapped toilet? No   TIMED UP AND  GO:  Was the test performed? No .  Length of time to ambulate 10 feet: N/A sec.   Gait steady and fast without use of assistive device  Cognitive Function:        10/17/2022    2:47 PM  6CIT Screen  What Year? 0 points  What month? 0 points  What time? 0 points  Count back from 20 0 points  Months in reverse 0 points  Repeat phrase 0 points  Total Score 0 points    Immunizations Immunization History  Administered Date(s) Administered   Fluad Quad(high Dose 65+) 04/01/2022   Influenza Inj Mdck Quad With Preservative 05/13/2018   Influenza,inj,Quad PF,6+ Mos 04/14/2017   Influenza-Unspecified 04/18/2014,  05/02/2015, 04/23/2020   PFIZER(Purple Top)SARS-COV-2 Vaccination 09/23/2019, 10/13/2019, 07/02/2020   Pneumococcal Conjugate-13 09/28/2021   Tdap 07/16/2011, 10/05/2021   Zoster Recombinat (Shingrix) 10/05/2021, 12/17/2021    TDAP status: Up to date  Flu Vaccine status: Up to date  Pneumococcal vaccine status: Due, Education has been provided regarding the importance of this vaccine. Advised may receive this vaccine at local pharmacy or Health Dept. Aware to provide a copy of the vaccination record if obtained from local pharmacy or Health Dept. Verbalized acceptance and understanding.  Covid-19 vaccine status: Information provided on how to obtain vaccines.   Qualifies for Shingles Vaccine? Yes   Zostavax completed Yes   Shingrix Completed?: Yes  Screening Tests Health Maintenance  Topic Date Due   COLONOSCOPY (Pts 45-42yrs Insurance coverage will need to be confirmed)  Never done   MAMMOGRAM  11/01/2021   Pneumonia Vaccine 23+ Years old (2 of 2 - PPSV23 or PCV20) 09/29/2022   COVID-19 Vaccine (4 - 2023-24 season) 11/02/2022 (Originally 03/15/2022)   INFLUENZA VACCINE  02/13/2023   Medicare Annual Wellness (AWV)  10/17/2023   DTaP/Tdap/Td (3 - Td or Tdap) 10/06/2031   DEXA SCAN  Completed   Hepatitis C Screening  Completed   Zoster Vaccines- Shingrix  Completed    HPV VACCINES  Aged Out    Health Maintenance  Health Maintenance Due  Topic Date Due   COLONOSCOPY (Pts 45-66yrs Insurance coverage will need to be confirmed)  Never done   MAMMOGRAM  11/01/2021   Pneumonia Vaccine 17+ Years old (2 of 2 - PPSV23 or PCV20) 09/29/2022    Colorectal cancer screening: Referral to GI placed 10/17/2022 . Pt aware the office will call re: appt.  Mammogram status: Ordered 4/42024 . Pt provided with contact info and advised to call to schedule appt.   Bone Density status: Ordered 10/17/2022 . Pt provided with contact info and advised to call to schedule appt.  Lung Cancer Screening: (Low Dose CT Chest recommended if Age 21-80 years, 30 pack-year currently smoking OR have quit w/in 15years.) does not qualify.   Lung Cancer Screening Referral: No   Additional Screening:  Hepatitis C Screening: does not qualify; Completed Yes   Vision Screening: Recommended annual ophthalmology exams for early detection of glaucoma and other disorders of the eye. Is the patient up to date with their annual eye exam?  Yes  Who is the provider or what is the name of the office in which the patient attends annual eye exams? Dr.Groat  If pt is not established with a provider, would they like to be referred to a provider to establish care? No .   Dental Screening: Recommended annual dental exams for proper oral hygiene  Community Resource Referral / Chronic Care Management: CRR required this visit?  No   CCM required this visit?  No      Plan:     I have personally reviewed and noted the following in the patient's chart:   Medical and social history Use of alcohol, tobacco or illicit drugs  Current medications and supplements including opioid prescriptions. Patient is not currently taking opioid prescriptions. Functional ability and status Nutritional status Physical activity Advanced directives List of other physicians Hospitalizations, surgeries, and ER visits in  previous 12 months Vitals Screenings to include cognitive, depression, and falls Referrals and appointments  In addition, I have reviewed and discussed with patient certain preventive protocols, quality metrics, and best practice recommendations. A written personalized care plan for preventive services as well as  general preventive health recommendations were provided to patient.  This service is provided via telemedicine  No vital signs collected/recorded due to the encounter was a telemedicine visit.   Location of patient (ex: home, work):  Home   Patient consents to a telephone visit:  yes  Location of the provider (ex: office, home):  [facility]   Name of any referring provider:  Richarda Blade ,FNP- C   I connected with Rosaland Altimari on 10/17/2022 by a video enabled telemedicine application and verified that I am speaking with the correct person using two identifiers.   I discussed the limitations of evaluation and management by telemedicine. The patient expressed understanding and agreed to proceed.  Nurse Notes: due for mammogram,bone density and screening colonoscopy  Caesar Bookman, NP   10/17/2022

## 2022-10-23 ENCOUNTER — Ambulatory Visit
Admission: RE | Admit: 2022-10-23 | Discharge: 2022-10-23 | Disposition: A | Discharge status: Home / Self Care | Payer: Medicare Other | Source: Ambulatory Visit | Attending: Family | Admitting: Family

## 2022-12-17 ENCOUNTER — Other Ambulatory Visit: Payer: Self-pay

## 2022-12-17 ENCOUNTER — Telehealth: Payer: Self-pay

## 2022-12-17 DIAGNOSIS — E039 Hypothyroidism, unspecified: Secondary | ICD-10-CM

## 2022-12-17 DIAGNOSIS — R748 Abnormal levels of other serum enzymes: Secondary | ICD-10-CM

## 2022-12-17 DIAGNOSIS — E782 Mixed hyperlipidemia: Secondary | ICD-10-CM

## 2022-12-17 NOTE — Telephone Encounter (Signed)
Noted.  Lipid panel ordered

## 2022-12-17 NOTE — Telephone Encounter (Signed)
Patient called regarding labs. She stated that she started atorvastatin and neede lipid panel rechecked. I have placed the orders for TSH and hepatic function as the orders nor appointment were made for patient from March results. Does patient need to have cholesterol level rechecked? If so, please place order for lipid panel and release order.Patient is coming in 12/18/22 for labs.    Message sent to Richarda Blade, NP (HIGH PRIORITY)

## 2022-12-18 ENCOUNTER — Other Ambulatory Visit: Payer: Medicare Other

## 2022-12-18 LAB — CBC WITH DIFFERENTIAL/PLATELET
Absolute Monocytes: 388 cells/uL (ref 200–950)
Basophils Absolute: 40 cells/uL (ref 0–200)
Basophils Relative: 1 %
Eosinophils Absolute: 140 cells/uL (ref 15–500)
Eosinophils Relative: 3.5 %
HCT: 37.2 % (ref 35.0–45.0)
Hemoglobin: 12.7 g/dL (ref 11.7–15.5)
Lymphs Abs: 1860 cells/uL (ref 850–3900)
MCH: 32 pg (ref 27.0–33.0)
MCHC: 34.1 g/dL (ref 32.0–36.0)
MCV: 93.7 fL (ref 80.0–100.0)
MPV: 9.8 fL (ref 7.5–12.5)
Monocytes Relative: 9.7 %
Neutro Abs: 1572 cells/uL (ref 1500–7800)
Neutrophils Relative %: 39.3 %
Platelets: 234 10*3/uL (ref 140–400)
RBC: 3.97 10*6/uL (ref 3.80–5.10)
RDW: 12 % (ref 11.0–15.0)
Total Lymphocyte: 46.5 %
WBC: 4 10*3/uL (ref 3.8–10.8)

## 2022-12-19 LAB — HEPATIC FUNCTION PANEL
AG Ratio: 2.2 (calc) (ref 1.0–2.5)
ALT: 21 U/L (ref 6–29)
AST: 22 U/L (ref 10–35)
Albumin: 4.4 g/dL (ref 3.6–5.1)
Alkaline phosphatase (APISO): 59 U/L (ref 37–153)
Bilirubin, Direct: 0.2 mg/dL (ref 0.0–0.2)
Globulin: 2 g/dL (calc) (ref 1.9–3.7)
Indirect Bilirubin: 0.6 mg/dL (calc) (ref 0.2–1.2)
Total Bilirubin: 0.8 mg/dL (ref 0.2–1.2)
Total Protein: 6.4 g/dL (ref 6.1–8.1)

## 2022-12-19 LAB — TSH: TSH: 2.2 mIU/L (ref 0.40–4.50)

## 2022-12-19 LAB — LIPID PANEL
Cholesterol: 175 mg/dL (ref <200)
HDL: 96 mg/dL (ref >=50)
LDL Cholesterol (Calc): 65 mg/dL (calc)
Non-HDL Cholesterol (Calc): 79 mg/dL (calc) (ref <130)
Total CHOL/HDL Ratio: 1.8 (calc) (ref <5.0)
Triglycerides: 56 mg/dL (ref <150)

## 2022-12-24 ENCOUNTER — Other Ambulatory Visit: Payer: Self-pay | Admitting: Family

## 2022-12-24 DIAGNOSIS — E782 Mixed hyperlipidemia: Secondary | ICD-10-CM

## 2023-03-16 ENCOUNTER — Other Ambulatory Visit: Payer: Self-pay | Admitting: Family

## 2023-03-16 DIAGNOSIS — R0981 Nasal congestion: Secondary | ICD-10-CM

## 2023-03-19 ENCOUNTER — Ambulatory Visit (INDEPENDENT_AMBULATORY_CARE_PROVIDER_SITE_OTHER): Payer: Medicare Other | Admitting: Family

## 2023-03-19 ENCOUNTER — Encounter: Payer: Self-pay | Admitting: Family

## 2023-03-19 VITALS — BP 110/64 | HR 82 | Temp 97.1°F | Resp 14 | Ht 63.5 in | Wt 136.0 lb

## 2023-03-19 DIAGNOSIS — E039 Hypothyroidism, unspecified: Secondary | ICD-10-CM

## 2023-03-19 DIAGNOSIS — E782 Mixed hyperlipidemia: Secondary | ICD-10-CM

## 2023-03-19 DIAGNOSIS — G43809 Other migraine, not intractable, without status migrainosus: Secondary | ICD-10-CM

## 2023-03-19 DIAGNOSIS — Z23 Encounter for immunization: Secondary | ICD-10-CM | POA: Diagnosis not present

## 2023-03-19 DIAGNOSIS — F3342 Major depressive disorder, recurrent, in full remission: Secondary | ICD-10-CM

## 2023-03-19 MED ORDER — LEVOTHYROXINE SODIUM 75 MCG PO TABS: 75.0000 ug | ORAL_TABLET | Freq: Every day | ORAL | 1 refills | Status: DC

## 2023-03-19 MED ORDER — SUMATRIPTAN SUCCINATE 25 MG PO TABS: 25.0000 mg | ORAL_TABLET | ORAL | 0 refills | Status: DC | PRN

## 2023-03-19 MED ORDER — PAROXETINE HCL 40 MG PO TABS: 40.0000 mg | ORAL_TABLET | Freq: Every day | ORAL | 1 refills | Status: DC

## 2023-03-19 MED ORDER — ATORVASTATIN CALCIUM 10 MG PO TABS: 10.0000 mg | ORAL_TABLET | Freq: Every day | ORAL | 1 refills | Status: DC

## 2023-03-19 NOTE — Progress Notes (Signed)
Provider: Richarda Blade FNP-C   Jennel Mara, [name], NP  Patient Care Team: Sota Hetz, [name], NP as PCP - General (Family Medicine)  Extended Emergency Contact Information Primary Emergency Contact: Porta,Janet  [place] Mobile Phone: [phone] Relation: Sister  Code Status: Full code Goals of care: Advanced Directive information    03/19/2023    9:03 AM  Advanced Directives  Does Patient Have a Medical Advance Directive? No     Chief Complaint  Patient presents with   Follow-up    6 month follow up. [facility] reviewed. Due for vaccines.    HPI:  Pt is a 67 y.o. female seen today for 6 months follow up for  medical management of chronic diseases.    Hypothyroidism - 2.20 previous 6.  Still taking levothyroxine 75 mcg daily.  Reports no other symptoms  Hyperlipidemia - latest LDL 65 improved from 195 and chol 175 from 311 sometime tends to snack on ice cream while cat sitting.has been walking with her sister and plays pick ball.  Depression - tries not entertain bad thoughts.   Migraine - has had few attacks every once a while usually associated with sinus headache tries to treat sinus which helps.   Immunization - Influenza,COVID-19 and PNA vaccine   Colon cancer screen-previously referred to gastroenterology.  Has phone number will call to schedule appt. she denies any dark-colored or blood in the stool  Will get influenza vaccine this visit.PNA vaccine next visit.Aware to get COVID-19 vaccine at the Pharmacy.  Past Medical History:  Diagnosis Date   Allergy    Arthritis    Depression    H/O mammogram 2021   History of Papanicolaou smear of cervix 2020   Migraine    Thyroid disease    No past surgical history on file.  Allergies  Allergen Reactions   Penicillins Rash    Has patient had a PCN reaction causing immediate rash, facial/tongue/throat swelling, SOB or lightheadedness with hypotension: Yes  Has patient had a PCN reaction causing  severe rash involving mucus membranes or skin necrosis: No  Has patient had a PCN reaction that required hospitalization No  Has patient had a PCN reaction occurring within the last 10 years: No  If all of the above answers are "NO", then may proceed with Cephalosporin use.  Has patient had a PCN reaction causing immediate rash, facial/tongue/throat swelling, SOB or lightheadedness with hypotension: Yes Has patient had a PCN reaction causing severe rash involving mucus membranes or skin necrosis: No Has patient had a PCN reaction that required hospitalization No Has patient had a PCN reaction occurring within the last 10 years: No If all of the above answers are "NO", then may proceed with Cephalosporin use.   Sulfa Antibiotics Rash    Rash    Allergies as of 03/19/2023       Reactions   Penicillins Rash   Has patient had a PCN reaction causing immediate rash, facial/tongue/throat swelling, SOB or lightheadedness with hypotension: Yes Has patient had a PCN reaction causing severe rash involving mucus membranes or skin necrosis: No Has patient had a PCN reaction that required hospitalization No Has patient had a PCN reaction occurring within the last 10 years: No If all of the above answers are "NO", then may proceed with Cephalosporin use. Has patient had a PCN reaction causing immediate rash, facial/tongue/throat swelling, SOB or lightheadedness with hypotension: Yes Has patient had a PCN reaction causing severe rash involving mucus membranes or skin necrosis:  No Has patient had a PCN reaction that required hospitalization No Has patient had a PCN reaction occurring within the last 10 years: No If all of the above answers are "NO", then may proceed with Cephalosporin use.   Sulfa Antibiotics Rash   Rash        Medication List        Accurate as of March 19, 2023  9:09 AM. If you have any questions, ask your nurse or doctor.          atorvastatin 10 MG tablet Commonly known  as: LIPITOR TAKE 1 TABLET BY MOUTH EVERY DAY   fluticasone 50 MCG/ACT nasal spray Commonly known as: FLONASE SPRAY 2 SPRAYS INTO EACH NOSTRIL EVERY DAY   IBUPROFEN PO Take 1 tablet by mouth as needed.   levothyroxine 75 MCG tablet Commonly known as: SYNTHROID TAKE 1 TABLET BY MOUTH EVERY DAY   MULTIVITAMIN PO Take 1 capsule by mouth daily.   PARoxetine 40 MG tablet Commonly known as: PAXIL Take 1 tablet (40 mg total) by mouth daily.   SUMAtriptan 25 MG tablet Commonly known as: Imitrex Take 1 tablet (25 mg total) by mouth every 2 (two) hours as needed for migraine. May repeat in 2 hours if headache persists or recurs.   TYLENOL PO Take 2 capsules by mouth as needed.        Review of Systems  Constitutional:  Negative for appetite change, chills, fatigue, fever and unexpected weight change.  HENT:  Negative for congestion, dental problem, ear discharge, ear pain, facial swelling, hearing loss, nosebleeds, postnasal drip, rhinorrhea, sinus pressure, sinus pain, sneezing, sore throat, tinnitus and trouble swallowing.   Eyes:  Negative for pain, discharge, redness, itching and visual disturbance.  Respiratory:  Negative for cough, chest tightness, shortness of breath and wheezing.   Cardiovascular:  Negative for chest pain, palpitations and leg swelling.  Gastrointestinal:  Negative for abdominal distention, abdominal pain, blood in stool, constipation, diarrhea, nausea and vomiting.  Endocrine: Negative for cold intolerance, heat intolerance, polydipsia, polyphagia and polyuria.  Genitourinary:  Negative for difficulty urinating, dysuria, flank pain, frequency and urgency.  Musculoskeletal:  Negative for arthralgias, back pain, gait problem, joint swelling, myalgias, neck pain and neck stiffness.  Skin:  Negative for color change, pallor, rash and wound.  Neurological:  Negative for dizziness, syncope, speech difficulty, weakness and numbness.       Chronic migraine    Hematological:  Does not bruise/bleed easily.  Psychiatric/Behavioral:  Negative for agitation, behavioral problems, confusion, hallucinations, self-injury, sleep disturbance and suicidal ideas. The patient is not nervous/anxious.     Immunization History  Administered Date(s) Administered   Fluad Quad(high Dose 65+) 04/01/2022   Influenza Inj Mdck Quad With Preservative 05/13/2018   Influenza,inj,Quad PF,6+ Mos 04/14/2017   Influenza-Unspecified 04/18/2014, 05/02/2015, 04/23/2020   PFIZER(Purple Top)SARS-COV-2 Vaccination 09/23/2019, 10/13/2019, 07/02/2020   Pneumococcal Conjugate-13 09/28/2021   Tdap 07/16/2011, 10/05/2021   Zoster Recombinant(Shingrix) 10/05/2021, 12/17/2021   Pertinent  Health Maintenance Due  Topic Date Due   Colonoscopy  Never done   INFLUENZA VACCINE  02/13/2023   MAMMOGRAM  10/22/2024   DEXA SCAN  Completed      10/29/2021    1:07 PM 04/01/2022    9:07 AM 09/16/2022    9:08 AM 10/17/2022    2:46 PM 03/19/2023    9:03 AM  Fall Risk  Falls in the past year? 0 0 1 1 0  Was there an injury with Fall? 0 0 1 1  Fall Risk Category Calculator 0 0 2 2   Fall Risk Category (Retired) Low Low     (RETIRED) Patient Fall Risk Level Low fall risk Low fall risk     Patient at Risk for Falls Due to No Fall Risks No Fall Risks     Fall risk Follow up Falls evaluation completed Falls evaluation completed  Falls evaluation completed;Education provided;Falls prevention discussed    Functional Status Survey:    Vitals:   03/19/23 0904  BP: 110/64  Pulse: 82  Resp: 14  Temp: (!) 97.1 F (36.2 C)  SpO2: 98%  Weight: 136 lb (61.7 kg)  Height: 5' 3.5" (1.613 m)   Body mass index is 23.71 kg/m. Physical Exam Vitals reviewed.  Constitutional:      General: She is not in acute distress.    Appearance: Normal appearance. She is normal weight. She is not ill-appearing or diaphoretic.  HENT:     Head: Normocephalic.     Right Ear: Tympanic membrane, ear canal and  external ear normal. There is no impacted cerumen.     Left Ear: Tympanic membrane, ear canal and external ear normal. There is no impacted cerumen.     Nose: Nose normal. No congestion or rhinorrhea.     Mouth/Throat:     Mouth: Mucous membranes are moist.     Pharynx: Oropharynx is clear. No oropharyngeal exudate or posterior oropharyngeal erythema.  Eyes:     General: No scleral icterus.       Right eye: No discharge.        Left eye: No discharge.     Extraocular Movements: Extraocular movements intact.     Conjunctiva/sclera: Conjunctivae normal.     Pupils: Pupils are equal, round, and reactive to light.  Neck:     Vascular: No carotid bruit.  Cardiovascular:     Rate and Rhythm: Normal rate and regular rhythm.     Pulses: Normal pulses.     Heart sounds: Normal heart sounds. No murmur heard.    No friction rub. No gallop.  Pulmonary:     Effort: Pulmonary effort is normal. No respiratory distress.     Breath sounds: Normal breath sounds. No wheezing, rhonchi or rales.  Chest:     Chest wall: No tenderness.  Abdominal:     General: Bowel sounds are normal. There is no distension.     Palpations: Abdomen is soft. There is no mass.     Tenderness: There is no abdominal tenderness. There is no right CVA tenderness, left CVA tenderness, guarding or rebound.  Musculoskeletal:        General: No swelling or tenderness. Normal range of motion.     Cervical back: Normal range of motion. No rigidity or tenderness.     Right lower leg: No edema.     Left lower leg: No edema.  Lymphadenopathy:     Cervical: No cervical adenopathy.  Skin:    General: Skin is warm and dry.     Coloration: Skin is not pale.     Findings: No bruising, erythema, lesion or rash.  Neurological:     Mental Status: She is alert and oriented to person, place, and time.     Cranial Nerves: No cranial nerve deficit.     Sensory: No sensory deficit.     Motor: No weakness.     Coordination: Coordination  normal.     Gait: Gait normal.  Psychiatric:        Mood  and Affect: Mood normal.        Speech: Speech normal.        Behavior: Behavior normal.        Thought Content: Thought content normal.        Judgment: Judgment normal.     Labs reviewed: Recent Labs    09/16/22 0939  NA 138  K 4.4  CL 102  CO2 28  GLUCOSE 85  BUN 8  CREATININE 0.68  CALCIUM 9.0   Recent Labs    09/16/22 0939 12/18/22 0837  AST 30 22  ALT 31* 21  BILITOT 0.5 0.8  PROT 6.6 6.4   Recent Labs    09/16/22 0939 12/18/22 0912  WBC 4.5 4.0  NEUTROABS 1,661 1,572  HGB 12.8 12.7  HCT 38.7 37.2  MCV 93.7 93.7  PLT 278 234   Lab Results  Component Value Date   TSH 2.20 12/18/2022   No results found for: "HGBA1C" Lab Results  Component Value Date   CHOL 175 12/18/2022   HDL 96 12/18/2022   LDLCALC 65 12/18/2022   LDLDIRECT 151 (H) 11/02/2013   TRIG 56 12/18/2022   CHOLHDL 1.8 12/18/2022    Significant Diagnostic Results in last 30 days:  No results found.  Assessment/Plan 1. Need for influenza vaccination - Afebrile  Flu shot administered by [facility] no acute reaction reported.  - Flu Vaccine Trivalent High Dose (Fluad)  2. Acquired hypothyroidism Lab Results  Component Value Date   TSH 2.20 12/18/2022  -Continue levothyroxine 75 mcg daily on empty stomach  3. Mixed hyperlipidemia Previous LDL at goal -Dietary modification and exercise advised -Continue on atorvastatin  4. Recurrent major depressive disorder, in full remission (HCC) Mood stable -Continue on Paxil  5. Migraine variants, not intractable Chronic -Continue on Imitrex as needed  Family/ staff Communication: Reviewed plan of care with patient verbalized understanding   Labs/tests ordered: Future labs - CBC with Differential/Platelet - CMP with eGFR([facility]) - TSH - Lipid panel  Next Appointment : Return in about 6 months (around 09/16/2023) for medical mangement of chronic issues., Fasting labs in 6 months  prior to visit.   Caesar Bookman, NP

## 2023-03-29 ENCOUNTER — Other Ambulatory Visit: Payer: Self-pay | Admitting: Family

## 2023-03-29 DIAGNOSIS — E782 Mixed hyperlipidemia: Secondary | ICD-10-CM

## 2023-04-04 ENCOUNTER — Other Ambulatory Visit: Payer: Self-pay | Admitting: Family

## 2023-04-04 DIAGNOSIS — E039 Hypothyroidism, unspecified: Secondary | ICD-10-CM

## 2023-04-16 ENCOUNTER — Other Ambulatory Visit: Payer: Self-pay | Admitting: Family

## 2023-04-16 DIAGNOSIS — G43809 Other migraine, not intractable, without status migrainosus: Secondary | ICD-10-CM

## 2023-04-16 NOTE — Telephone Encounter (Signed)
Pharmacy requested refill.  Pended Rx and sent to [facility] for approval due to HIGH ALERT Warning.  

## 2023-04-29 ENCOUNTER — Ambulatory Visit
Admission: RE | Admit: 2023-04-29 | Discharge: 2023-04-29 | Disposition: A | Discharge status: Home / Self Care | Payer: Medicare Other | Source: Ambulatory Visit | Attending: Family | Admitting: Family

## 2023-05-02 ENCOUNTER — Other Ambulatory Visit: Payer: Self-pay

## 2023-05-02 DIAGNOSIS — M85851 Other specified disorders of bone density and structure, right thigh: Secondary | ICD-10-CM

## 2023-05-02 MED ORDER — CALCIUM CITRATE + D 250-5 MG-MCG PO TABS: 1.0000 | ORAL_TABLET | Freq: Two times a day (BID) | ORAL | Status: AC

## 2023-05-19 ENCOUNTER — Other Ambulatory Visit: Payer: Self-pay | Admitting: Family

## 2023-05-19 DIAGNOSIS — G43809 Other migraine, not intractable, without status migrainosus: Secondary | ICD-10-CM

## 2023-05-19 NOTE — Telephone Encounter (Signed)
Pharmacy requested refill.  Pended Rx and sent to [facility] for approval due to HIGH ALERT Warning.  

## 2023-09-12 ENCOUNTER — Other Ambulatory Visit: Payer: Medicare Other

## 2023-09-12 DIAGNOSIS — F3342 Major depressive disorder, recurrent, in full remission: Secondary | ICD-10-CM

## 2023-09-12 DIAGNOSIS — E039 Hypothyroidism, unspecified: Secondary | ICD-10-CM

## 2023-09-12 DIAGNOSIS — G43809 Other migraine, not intractable, without status migrainosus: Secondary | ICD-10-CM

## 2023-09-12 DIAGNOSIS — E782 Mixed hyperlipidemia: Secondary | ICD-10-CM

## 2023-09-13 LAB — CBC WITH DIFFERENTIAL/PLATELET
Absolute Lymphocytes: 2184 {cells}/uL (ref 850–3900)
Absolute Monocytes: 456 {cells}/uL (ref 200–950)
Basophils Absolute: 60 {cells}/uL (ref 0–200)
Basophils Relative: 1.4 %
Eosinophils Absolute: 202 {cells}/uL (ref 15–500)
Eosinophils Relative: 4.7 %
HCT: 37.5 % (ref 35.0–45.0)
Hemoglobin: 12.5 g/dL (ref 11.7–15.5)
MCH: 32.1 pg (ref 27.0–33.0)
MCHC: 33.3 g/dL (ref 32.0–36.0)
MCV: 96.2 fL (ref 80.0–100.0)
MPV: 9.6 fL (ref 7.5–12.5)
Monocytes Relative: 10.6 %
Neutro Abs: 1398 {cells}/uL — ABNORMAL LOW (ref 1500–7800)
Neutrophils Relative %: 32.5 %
Platelets: 225 10*3/uL (ref 140–400)
RBC: 3.9 10*6/uL (ref 3.80–5.10)
RDW: 12.1 % (ref 11.0–15.0)
Total Lymphocyte: 50.8 %
WBC: 4.3 10*3/uL (ref 3.8–10.8)

## 2023-09-13 LAB — COMPLETE METABOLIC PANEL WITH GFR
AG Ratio: 2.3 (calc) (ref 1.0–2.5)
ALT: 38 U/L — ABNORMAL HIGH (ref 6–29)
AST: 33 U/L (ref 10–35)
Albumin: 4.5 g/dL (ref 3.6–5.1)
Alkaline phosphatase (APISO): 63 U/L (ref 37–153)
BUN: 9 mg/dL (ref 7–25)
CO2: 30 mmol/L (ref 20–32)
Calcium: 9 mg/dL (ref 8.6–10.4)
Chloride: 100 mmol/L (ref 98–110)
Creat: 0.59 mg/dL (ref 0.50–1.05)
Globulin: 2 g/dL (ref 1.9–3.7)
Glucose, Bld: 85 mg/dL (ref 65–99)
Potassium: 4.1 mmol/L (ref 3.5–5.3)
Sodium: 135 mmol/L (ref 135–146)
Total Bilirubin: 0.8 mg/dL (ref 0.2–1.2)
Total Protein: 6.5 g/dL (ref 6.1–8.1)
eGFR: 99 mL/min/{1.73_m2} (ref >=60)

## 2023-09-13 LAB — LIPID PANEL
Cholesterol: 196 mg/dL (ref <200)
HDL: 90 mg/dL (ref >=50)
LDL Cholesterol (Calc): 92 mg/dL
Non-HDL Cholesterol (Calc): 106 mg/dL (ref <130)
Total CHOL/HDL Ratio: 2.2 (calc) (ref <5.0)
Triglycerides: 58 mg/dL (ref <150)

## 2023-09-13 LAB — TSH: TSH: 4.17 m[IU]/L (ref 0.40–4.50)

## 2023-09-17 ENCOUNTER — Ambulatory Visit (INDEPENDENT_AMBULATORY_CARE_PROVIDER_SITE_OTHER): Payer: Medicare Other | Admitting: Family

## 2023-09-17 ENCOUNTER — Encounter: Payer: Self-pay | Admitting: Family

## 2023-09-17 VITALS — BP 118/70 | HR 82 | Temp 97.6°F | Resp 18 | Ht 60.35 in | Wt 138.4 lb

## 2023-09-17 DIAGNOSIS — Z1211 Encounter for screening for malignant neoplasm of colon: Secondary | ICD-10-CM

## 2023-09-17 DIAGNOSIS — F3342 Major depressive disorder, recurrent, in full remission: Secondary | ICD-10-CM

## 2023-09-17 DIAGNOSIS — E039 Hypothyroidism, unspecified: Secondary | ICD-10-CM | POA: Diagnosis not present

## 2023-09-17 DIAGNOSIS — Z23 Encounter for immunization: Secondary | ICD-10-CM | POA: Diagnosis not present

## 2023-09-17 DIAGNOSIS — R0981 Nasal congestion: Secondary | ICD-10-CM

## 2023-09-17 DIAGNOSIS — G43809 Other migraine, not intractable, without status migrainosus: Secondary | ICD-10-CM

## 2023-09-17 DIAGNOSIS — E782 Mixed hyperlipidemia: Secondary | ICD-10-CM | POA: Diagnosis not present

## 2023-09-17 MED ORDER — ATORVASTATIN CALCIUM 10 MG PO TABS: 10.0000 mg | ORAL_TABLET | Freq: Every day | ORAL | 1 refills | Status: DC

## 2023-09-17 MED ORDER — LEVOTHYROXINE SODIUM 75 MCG PO TABS: 75.0000 ug | ORAL_TABLET | Freq: Every day | ORAL | 1 refills | Status: DC

## 2023-09-17 MED ORDER — SUMATRIPTAN SUCCINATE 25 MG PO TABS: 25.0000 mg | ORAL_TABLET | ORAL | 0 refills | Status: DC | PRN

## 2023-09-17 MED ORDER — PAROXETINE HCL 40 MG PO TABS: 40.0000 mg | ORAL_TABLET | Freq: Every day | ORAL | 1 refills | Status: DC

## 2023-09-17 MED ORDER — FLUTICASONE PROPIONATE 50 MCG/ACT NA SUSP: 2.0000 | Freq: Every day | NASAL | 1 refills | Status: DC

## 2023-09-21 NOTE — Progress Notes (Signed)
 Provider: Richarda Blade FNP-C   [name], [name], NP  Patient Care Team: [name], [name], NP as PCP - General (Family Medicine)  Extended Emergency Contact Information Primary Emergency Contact: Mittal,Janet [address], [place] [address] [place] Phone: [phone] Relation: Sister  Code Status:  Full Code  Goals of care: Advanced Directive information    09/17/2023    9:28 AM  Advanced Directives  Does Patient Have a Medical Advance Directive? No  Would patient like information on creating a medical advance directive? No - Patient declined     Chief Complaint  Patient presents with   Medical Management of Chronic Issues    6 month follow up Discuss covid, colonoscopy, , AWV    Discussed the use of AI scribe software for clinical note transcription with the patient, who gave verbal consent to proceed.  History of Present Illness   Sheri Adkins is a 68 year old female with migraines and hyperlipidemia who presents for a six-month follow-up visit.  She experiences tension headaches and sinus-related headaches, which can progress to migraines if not treated promptly. She uses ibuprofen as needed, which helps if taken at the onset of pain. For migraines, she takes Imitrex every two hours as needed. Atmospheric pressure changes and sinus issues, often related to allergies, can trigger her headaches. She uses Flonase at night to manage her allergies and occasionally takes Claritin if she runs out of Flonase.  She is on atorvastatin for hyperlipidemia, with no significant side effects except for occasional short-lived muscle cramps. Her cholesterol levels have improved significantly over the past year, but she notes a recent slight increase, which she attributes to dietary habits.  She is on levothyroxine 75 mcg for thyroid management and reports stable thyroid function.  She takes Paxil 40 mg for depression, which helps  stabilize her mood.  She reports occasional mild back pain upon waking, which she attributes to her posture while caring for cats. She manages this with yoga exercises. No abdominal pain, blood in stool, or significant gastrointestinal issues. She occasionally experiences mild acid reflux, usually related to stress, which resolves on its own.  She recently acquired hearing aids, which have significantly improved her hearing.  She is up to date on most immunizations but has not received the latest COVID vaccine. She is due for a colonoscopy but has not scheduled it yet.   Past Medical History:  Diagnosis Date   Allergy    Arthritis    Depression    H/O mammogram 2021   History of Papanicolaou smear of cervix 2020   Migraine    Thyroid disease    History reviewed. No pertinent surgical history.  Allergies  Allergen Reactions   Penicillins Rash    Has patient had a PCN reaction causing immediate rash, facial/tongue/throat swelling, SOB or lightheadedness with hypotension: Yes  Has patient had a PCN reaction causing severe rash involving mucus membranes or skin necrosis: No  Has patient had a PCN reaction that required hospitalization No  Has patient had a PCN reaction occurring within the last 10 years: No  If all of the above answers are "NO", then may proceed with Cephalosporin use.  Has patient had a PCN reaction causing immediate rash, facial/tongue/throat swelling, SOB or lightheadedness with hypotension: Yes Has patient had a PCN reaction causing severe rash involving mucus membranes or skin necrosis: No Has patient had a PCN reaction that required  hospitalization No Has patient had a PCN reaction occurring within the last 10 years: No If all of the above answers are "NO", then may proceed with Cephalosporin use.   Sulfa Antibiotics Rash    Rash    Allergies as of 09/17/2023       Reactions   Penicillins Rash   Has patient had a PCN reaction causing immediate rash,  facial/tongue/throat swelling, SOB or lightheadedness with hypotension: Yes Has patient had a PCN reaction causing severe rash involving mucus membranes or skin necrosis: No Has patient had a PCN reaction that required hospitalization No Has patient had a PCN reaction occurring within the last 10 years: No If all of the above answers are "NO", then may proceed with Cephalosporin use. Has patient had a PCN reaction causing immediate rash, facial/tongue/throat swelling, SOB or lightheadedness with hypotension: Yes Has patient had a PCN reaction causing severe rash involving mucus membranes or skin necrosis: No Has patient had a PCN reaction that required hospitalization No Has patient had a PCN reaction occurring within the last 10 years: No If all of the above answers are "NO", then may proceed with Cephalosporin use.   Sulfa Antibiotics Rash   Rash        Medication List        Accurate as of September 17, 2023 11:59 PM. If you have any questions, ask your nurse or doctor.          STOP taking these medications    TYLENOL PO Stopped by: [name] [name]       TAKE these medications    atorvastatin 10 MG tablet Commonly known as: LIPITOR Take 1 tablet (10 mg total) by mouth daily.   Calcium Citrate + D 250-5 MG-MCG Tabs Generic drug: Calcium Citrate-Vitamin D Take 1 tablet by mouth 2 (two) times daily.   fluticasone 50 MCG/ACT nasal spray Commonly known as: FLONASE Place 2 sprays into both nostrils daily. SPRAY 2 SPRAYS INTO EACH NOSTRIL EVERY DAY What changed: See the new instructions. Changed by: [name] [name]   IBUPROFEN PO Take 1 tablet by mouth as needed.   levothyroxine 75 MCG tablet Commonly known as: SYNTHROID Take 1 tablet (75 mcg total) by mouth daily.   MULTIVITAMIN PO Take 1 capsule by mouth daily.   PARoxetine 40 MG tablet Commonly known as: PAXIL Take 1 tablet (40 mg total) by mouth daily.   SUMAtriptan 25 MG tablet Commonly known as:  IMITREX Take 1 tablet (25 mg total) by mouth every 2 (two) hours as needed for migraine. May repeat in 2 hours if headache persists or recurs.        Review of Systems  Constitutional:  Negative for appetite change, chills, fatigue, fever and unexpected weight change.  HENT:  Positive for congestion and hearing loss. Negative for dental problem, ear discharge, ear pain, facial swelling, nosebleeds, postnasal drip, rhinorrhea, sinus pressure, sinus pain, sneezing, sore throat, tinnitus and trouble swallowing.   Eyes:  Negative for pain, discharge, redness, itching and visual disturbance.  Respiratory:  Negative for cough, chest tightness, shortness of breath and wheezing.   Cardiovascular:  Negative for chest pain, palpitations and leg swelling.  Gastrointestinal:  Negative for abdominal distention, abdominal pain, blood in stool, constipation, diarrhea, nausea and vomiting.  Endocrine: Negative for cold intolerance, heat intolerance, polydipsia, polyphagia and polyuria.  Genitourinary:  Negative for difficulty urinating, dysuria, flank pain, frequency and urgency.  Musculoskeletal:  Negative for arthralgias, back pain, gait problem, joint swelling, myalgias,  neck pain and neck stiffness.  Skin:  Negative for color change, pallor, rash and wound.  Neurological:  Positive for headaches. Negative for dizziness, syncope, speech difficulty, weakness, light-headedness and numbness.       Migraine   Hematological:  Does not bruise/bleed easily.  Psychiatric/Behavioral:  Negative for agitation, behavioral problems, confusion, hallucinations, self-injury, sleep disturbance and suicidal ideas. The patient is not nervous/anxious.     Immunization History  Administered Date(s) Administered   Fluad Quad(high Dose 65+) 04/01/2022   Fluad Trivalent(High Dose 65+) 03/19/2023   Influenza Inj Mdck Quad With Preservative 05/13/2018   Influenza,inj,Quad PF,6+ Mos 04/14/2017   Influenza-Unspecified  04/18/2014, 05/02/2015, 04/23/2020   PFIZER(Purple Top)SARS-COV-2 Vaccination 09/23/2019, 10/13/2019, 07/02/2020   PNEUMOCOCCAL CONJUGATE-20 09/17/2023   Pneumococcal Conjugate-13 09/28/2021   Tdap 07/16/2011, 10/05/2021   Zoster Recombinant(Shingrix) 10/05/2021, 12/17/2021   Pertinent  Health Maintenance Due  Topic Date Due   Colonoscopy  Never done   MAMMOGRAM  10/22/2024   INFLUENZA VACCINE  Completed   DEXA SCAN  Completed      04/01/2022    9:07 AM 09/16/2022    9:08 AM 10/17/2022    2:46 PM 03/19/2023    9:03 AM 09/17/2023    9:25 AM  Fall Risk  Falls in the past year? 0 1 1 0 0  Was there an injury with Fall? 0 1 1  0  Fall Risk Category Calculator 0 2 2  0  Fall Risk Category (Retired) Low      (RETIRED) Patient Fall Risk Level Low fall risk      Patient at Risk for Falls Due to No Fall Risks      Fall risk Follow up Falls evaluation completed  Falls evaluation completed;Education provided;Falls prevention discussed     Functional Status Survey:    Vitals:   09/17/23 0929  BP: 118/70  Pulse: 82  Resp: 18  Temp: 97.6 F (36.4 C)  SpO2: 97%  Weight: 138 lb 6.4 oz (62.8 kg)  Height: 5' 0.35" (1.533 m)   Body mass index is 26.72 kg/m. Physical Exam  VITALS: T- 97.7, P- 82, BP- 118/70, SaO2- 97% MEASUREMENTS: Weight- 138.4. GENERAL: Alert, cooperative, well developed, no acute distress HEENT: Normocephalic, normal oropharynx, moist mucous membranes, ears and nose normal, no sinus tenderness NECK: Neck normal CHEST: Clear to auscultation bilaterally, no wheezes, rhonchi, or crackles CARDIOVASCULAR: Normal heart rate and rhythm, S1 and S2 normal without murmurs ABDOMEN: Soft, non-tender, non-distended, without organomegaly, normal bowel sounds EXTREMITIES: No cyanosis or edema MUSCULOSKELETAL: Legs normal range of motion NEUROLOGICAL: Cranial nerves grossly intact, moves all extremities without gross motor or sensory deficit  SKIN: No rash, no lesion or erythema   PSYCHIATRY/BEHAVIORAL : Mood stable   Labs reviewed: Recent Labs    09/12/23 0804  NA 135  K 4.1  CL 100  CO2 30  GLUCOSE 85  BUN 9  CREATININE 0.59  CALCIUM 9.0   Recent Labs    12/18/22 0837 09/12/23 0804  AST 22 33  ALT 21 38*  BILITOT 0.8 0.8  PROT 6.4 6.5   Recent Labs    12/18/22 0912 09/12/23 0804  WBC 4.0 4.3  NEUTROABS 1,572 1,398*  HGB 12.7 12.5  HCT 37.2 37.5  MCV 93.7 96.2  PLT 234 225   Lab Results  Component Value Date   TSH 4.17 09/12/2023   No results found for: "HGBA1C" Lab Results  Component Value Date   CHOL 196 09/12/2023   HDL 90 09/12/2023  LDLCALC 92 09/12/2023   LDLDIRECT 151 (H) 11/02/2013   TRIG 58 09/12/2023   CHOLHDL 2.2 09/12/2023    Significant Diagnostic Results in last 30 days:  No results found.  Assessment/Plan  Migraine Headaches Frequent migraines often triggered by atmospheric pressure changes. Uses Imitrex every two hours as needed and ibuprofen for initial headache pain. Early intervention is crucial to prevent escalation into migraines. - Continue Imitrex as needed - Continue ibuprofen as needed  Allergic Rhinitis Uses Flonase nightly to prevent waking with migraines and occasionally uses Claritin if Flonase is unavailable. Consistent use of Flonase is beneficial. - Continue Flonase - Use Claritin as needed if Flonase is unavailable  Hyperlipidemia Cholesterol levels have slightly increased to 190 from 175 nine months ago. HDL and triglycerides are within acceptable ranges. Increased ice cream consumption may be contributing to the rise. Dietary modifications are important. - Continue atorvastatin - Advise reducing ice cream consumption to manage cholesterol levels  Hypothyroidism On levothyroxine 75 mcg daily. Thyroid levels are well-managed. - Continue levothyroxine 75 mcg daily  Depression On Paxil 40 mg daily, which helps maintain mood stability. - Continue Paxil 40 mg daily  General Health  Maintenance Due for a colonoscopy and the latest COVID vaccine. Up to date on other immunizations, including the pneumonia vaccine. Discussed the importance of colorectal cancer screening and COVID vaccination. - Refer to gastroenterology for colonoscopy - Advise to get the COVID vaccine at the pharmacy  Follow-up - Schedule follow-up appointment in six months - Recheck lab work at next visit.   Family/ staff Communication: Reviewed plan of care with patient verbalized understanding   Labs/tests ordered:  - CBC with Differential/Platelet - CMP with eGFR([facility]) - TSH - Lipid panel  Next Appointment : Return in about 6 months (around 03/19/2024) for medical mangement of chronic issues., Fasting labs in 6 months prior to visit.   Spent 30 minutes of Face to face and non-face to face with patient  >50% time spent counseling; reviewing medical record; tests; labs; documentation and developing future plan of care.   Caesar Bookman, NP

## 2023-09-24 ENCOUNTER — Other Ambulatory Visit: Payer: Self-pay | Admitting: Family

## 2023-09-24 DIAGNOSIS — F3342 Major depressive disorder, recurrent, in full remission: Secondary | ICD-10-CM

## 2023-09-24 DIAGNOSIS — E039 Hypothyroidism, unspecified: Secondary | ICD-10-CM

## 2023-10-07 ENCOUNTER — Other Ambulatory Visit: Payer: Self-pay

## 2023-10-07 DIAGNOSIS — R7989 Other specified abnormal findings of blood chemistry: Secondary | ICD-10-CM

## 2023-10-21 ENCOUNTER — Encounter: Payer: Self-pay | Admitting: Family

## 2023-10-21 ENCOUNTER — Ambulatory Visit (INDEPENDENT_AMBULATORY_CARE_PROVIDER_SITE_OTHER): Payer: Medicare Other | Admitting: Family

## 2023-10-21 VITALS — BP 114/64 | HR 75 | Temp 97.8°F | Resp 19 | Ht 60.0 in | Wt 136.8 lb

## 2023-10-21 DIAGNOSIS — Z0000 Encounter for general adult medical examination without abnormal findings: Secondary | ICD-10-CM

## 2023-10-21 NOTE — Progress Notes (Signed)
 Subjective:   Sheri Adkins is a 68 y.o. female who presents for Medicare Annual (Subsequent) preventive examination.  Visit Complete: In person  Patient Medicare AWV questionnaire was completed by the patient on 10/21/2023; I have confirmed that all information answered by patient is correct and no changes since this date.  Cardiac Risk Factors include: advanced age (>55men, >63 women);dyslipidemia     Objective:    Today's Vitals   10/21/23 1020  BP: 114/64  Pulse: 75  Resp: 19  Temp: 97.8 F (36.6 C)  SpO2: 96%  Weight: 136 lb 12.8 oz (62.1 kg)  Height: 5' (1.524 m)   Body mass index is 26.72 kg/m.     10/21/2023   10:14 AM 09/17/2023    9:28 AM 03/19/2023    9:03 AM 10/17/2022    2:46 PM 09/16/2022    9:07 AM 07/30/2022    2:35 PM 04/22/2022    3:04 PM  Advanced Directives  Does Patient Have a Medical Advance Directive? No No No No Yes No No  Type of Advance Directive     Out of facility DNR (pink MOST or yellow form)    Does patient want to make changes to medical advance directive?    No - Patient declined No - Patient declined    Would patient like information on creating a medical advance directive? No - Patient declined No - Patient declined    No - Patient declined     Current Medications (verified) Outpatient Encounter Medications as of 10/21/2023  Medication Sig   atorvastatin (LIPITOR) 10 MG tablet Take 1 tablet (10 mg total) by mouth daily.   Calcium Citrate-Vitamin D (CALCIUM CITRATE + D) 250-5 MG-MCG TABS Take 1 tablet by mouth 2 (two) times daily.   fluticasone (FLONASE) 50 MCG/ACT nasal spray Place 2 sprays into both nostrils daily. SPRAY 2 SPRAYS INTO EACH NOSTRIL EVERY DAY   IBUPROFEN PO Take 1 tablet by mouth as needed.   levothyroxine (SYNTHROID) 75 MCG tablet Take 1 tablet (75 mcg total) by mouth daily.   Multiple Vitamin (MULTIVITAMIN PO) Take 1 capsule by mouth daily.   PARoxetine (PAXIL) 40 MG tablet Take 1 tablet (40 mg total) by mouth daily.    SUMAtriptan (IMITREX) 25 MG tablet Take 1 tablet (25 mg total) by mouth every 2 (two) hours as needed for migraine. May repeat in 2 hours if headache persists or recurs.   No facility-administered encounter medications on file as of 10/21/2023.    Allergies (verified) Penicillins and Sulfa antibiotics   History: Past Medical History:  Diagnosis Date   Allergy    Arthritis    Depression    H/O mammogram 2021   History of Papanicolaou smear of cervix 2020   Migraine    Thyroid disease    History reviewed. No pertinent surgical history. Family History  Problem Relation Age of Onset   Hypertension Mother    Stroke Mother    Mental illness Mother    Cancer Sister        ovarian cancer   Mental illness Sister    Heart disease Paternal Grandfather    Mental illness Brother    Cancer Maternal Grandmother    Mental illness Maternal Grandmother    Mental illness Brother    Mental illness Sister    Social History   Socioeconomic History   Marital status: Single    Spouse name: Not on file   Number of children: Not on file   Years of  education: Not on file   Highest education level: Doctorate  Occupational History   Occupation: Clinical research associate    Comment: Psychologist, clinical for [employer]  Tobacco Use   Smoking status: Never   Smokeless tobacco: Never  Vaping Use   Vaping status: Never Used  Substance and Sexual Activity   Alcohol use: Yes    Alcohol/week: 3.0 standard drinks of alcohol    Types: 3 Standard drinks or equivalent per week    Comment: 2 a week   Drug use: No   Sexual activity: Not on file  Other Topics Concern   Not on file  Social History Narrative   Tobacco use, amount per day now: N/A   Past tobacco use, amount per day: N/A   How many years did you use tobacco: N/A   Alcohol use (drinks per week): 2-3 weekly.   Diet:   Do you drink/eat things with caffeine: Yes   Marital status: Divorced                                  What year were you married? 1983   Do  you live in a house, apartment, assisted living, condo, trailer, etc.? House    Is it one or more stories? Yes   How many persons live in your home? 3   Do you have pets in your home?( please list) Cat   Highest Level of education completed? Ph.D   Current or past profession: [employer] you exercise?   Yes                               Type and how often? Walking 1-2 times monthly.    Do you have a living will? No   Do you have a DNR form? No                                  If not, do you want to discuss one?   Do you have signed POA/HPOA forms? No                       If so, please bring to you appointment      Do you have any difficulty bathing or dressing yourself?    Do you have any difficulty preparing food or eating?   Do you have any difficulty managing your medications?   Do you have any difficulty managing your finances?   Do you have any difficulty affording your medications?    Social Drivers of Corporate investment banker Strain: Low Risk  (09/15/2023)   Overall Financial Resource Strain (CARDIA)    Difficulty of Paying Living Expenses: Not hard at all  Food Insecurity: No Food Insecurity (09/15/2023)   Hunger Vital Sign    Worried About Running Out of Food in the Last Year: Never true    Ran Out of Food in the Last Year: Never true  Transportation Needs: No Transportation Needs (09/15/2023)   PRAPARE - Administrator, Civil Service (Medical): No    Lack of Transportation (Non-Medical): No  Physical Activity: Insufficiently Active (09/15/2023)   Exercise Vital Sign    Days of Exercise per Week: 2 days    Minutes of Exercise per Session: 60  min  Stress: No Stress Concern Present (09/15/2023)   [employer] of Occupational Health - Occupational Stress Questionnaire    Feeling of Stress : Only a little  Social Connections: Moderately Integrated (09/15/2023)   Social Connection and Isolation Panel [NHANES]    Frequency of Communication with Friends and Family:  Twice a week    Frequency of Social Gatherings with Friends and Family: Twice a week    Attends Religious Services: More than 4 times per year    Active Member of [employer] or Organizations: Yes    Attends Engineer, structural: More than 4 times per year    Marital Status: Divorced    Tobacco Counseling Counseling given: Not Answered   Clinical Intake:  Pre-visit preparation completed: No  Pain : No/denies pain     BMI - recorded: 26.72 Nutritional Status: BMI 25 -29 Overweight Nutritional Risks: None Diabetes: No  How often do you need to have someone help you when you read instructions, pamphlets, or other written materials from your doctor or pharmacy?: 1 - Never What is the last grade level you completed in school?: doctorate  Interpreter Needed?: No      Activities of Daily Living    10/21/2023   10:42 AM  In your present state of health, do you have any difficulty performing the following activities:  Hearing? 0  Vision? 0  Difficulty concentrating or making decisions? 1  Comment Remembering  Walking or climbing stairs? 0  Dressing or bathing? 0  Doing errands, shopping? 0  Preparing Food and eating ? N  Using the Toilet? N  In the past six months, have you accidently leaked urine? N  Do you have problems with loss of bowel control? N  Managing your Medications? N  Managing your Finances? N  Housekeeping or managing your Housekeeping? N    Patient Care Team: Aylen Stradford, [name], NP as PCP - General (Family Medicine)  Indicate any recent Medical Services you may have received from other than [facility] providers in the past year (date may be approximate).     Assessment:   This is a routine wellness examination for Lerae.  Hearing/Vision screen Hearing Screening - Comments:: PT got hearing aids in Dec.2024 Vision Screening - Comments:: Eye exam Jan.2025 no issues.   Goals Addressed             This Visit's Progress    Patient Stated   On track     Would like to remain independent as much as she can        Depression Screen    10/21/2023   10:12 AM 09/17/2023    9:26 AM 03/19/2023    9:03 AM 10/17/2022    2:45 PM 09/16/2022    9:08 AM 11/22/2021    2:17 PM 05/02/2020    8:11 AM  PHQ 2/9 Scores  PHQ - 2 Score 0 0 0 0 0 0 0  PHQ- 9 Score 2   0       Fall Risk    10/21/2023   10:12 AM 09/17/2023    9:25 AM 03/19/2023    9:03 AM 10/17/2022    2:46 PM 09/16/2022    9:08 AM  Fall Risk   Falls in the past year? 0 0 0 1 1  Number falls in past yr: 0 0  0 0  Injury with Fall? 0 0  1 1  Risk for fall due to : No Fall Risks  Follow up Falls evaluation completed   Falls evaluation completed;Education provided;Falls prevention discussed     MEDICARE RISK AT HOME: Medicare Risk at Home Any stairs in or around the home?: Yes If so, are there any without handrails?: No Home free of loose throw rugs in walkways, pet beds, electrical cords, etc?: No Adequate lighting in your home to reduce risk of falls?: Yes Life alert?: No Use of a cane, walker or w/c?: No Grab bars in the bathroom?: No Shower chair or bench in shower?: No Elevated toilet seat or a handicapped toilet?: No  TIMED UP AND GO:  Was the test performed?  Yes  Length of time to ambulate 10 feet: 8 sec Gait steady and fast without use of assistive device    Cognitive Function:        10/21/2023   10:18 AM 10/17/2022    2:47 PM  6CIT Screen  What Year? 0 points 0 points  What month? 0 points 0 points  What time? 0 points 0 points  Count back from 20 0 points 0 points  Months in reverse 0 points 0 points  Repeat phrase 2 points 0 points  Total Score 2 points 0 points    Immunizations Immunization History  Administered Date(s) Administered   Fluad Quad(high Dose 65+) 04/01/2022   Fluad Trivalent(High Dose 65+) 03/19/2023   Influenza Inj Mdck Quad With Preservative 05/13/2018   Influenza,inj,Quad PF,6+ Mos 04/14/2017   Influenza-Unspecified 04/18/2014,  05/02/2015, 04/23/2020   PFIZER(Purple Top)SARS-COV-2 Vaccination 09/23/2019, 10/13/2019, 07/02/2020   PNEUMOCOCCAL CONJUGATE-20 09/17/2023   Pneumococcal Conjugate-13 09/28/2021   Tdap 07/16/2011, 10/05/2021   Zoster Recombinant(Shingrix) 10/05/2021, 12/17/2021    TDAP status: Up to date  Flu Vaccine status: Up to date  Pneumococcal vaccine status: Up to date  Covid-19 vaccine status: Information provided on how to obtain vaccines.   Qualifies for Shingles Vaccine? Yes   Zostavax completed No   Shingrix Completed?: Yes  Screening Tests Health Maintenance  Topic Date Due   Colonoscopy  Never done   COVID-19 Vaccine (4 - 2024-25 season) 03/31/2024 (Originally 03/16/2023)   INFLUENZA VACCINE  02/13/2024   Medicare Annual Wellness (AWV)  10/20/2024   MAMMOGRAM  10/22/2024   DTaP/Tdap/Td (3 - Td or Tdap) 10/06/2031   Pneumonia Vaccine 59+ Years old  Completed   DEXA SCAN  Completed   Hepatitis C Screening  Completed   Zoster Vaccines- Shingrix  Completed   HPV VACCINES  Aged Out    Health Maintenance  Health Maintenance Due  Topic Date Due   Colonoscopy  Never done    Colorectal cancer screening: Referral to GI placed 10/17/2022. Pt aware the office will call re: appt.  Mammogram status: Completed 10/23/2022. Repeat every year  Bone Density status: Completed 04/29/2023. Results reflect: Bone density results: OSTEOPENIA. Repeat every 2 years.  Lung Cancer Screening: (Low Dose CT Chest recommended if Age 52-80 years, 20 pack-year currently smoking OR have quit w/in 15years.) does not qualify.   Lung Cancer Screening Referral: N/A   Additional Screening:  Hepatitis C Screening: does not qualify; Completed Yes   Vision Screening: Recommended annual ophthalmology exams for early detection of glaucoma and other disorders of the eye. Is the patient up to date with their annual eye exam?  Yes  Who is the provider or what is the name of the office in which the patient attends  annual eye exams? Dr.Groat  If pt is not established with a provider, would they like to be referred to  a provider to establish care? No .   Dental Screening: Recommended annual dental exams for proper oral hygiene  Diabetic Foot Exam: Diabetic Foot Exam: Completed N/A   Community Resource Referral / Chronic Care Management: CRR required this visit?  No   CCM required this visit?  No     Plan:     I have personally reviewed and noted the following in the patient's chart:   Medical and social history Use of alcohol, tobacco or illicit drugs  Current medications and supplements including opioid prescriptions. Patient is not currently taking opioid prescriptions. Functional ability and status Nutritional status Physical activity Advanced directives List of other physicians Hospitalizations, surgeries, and ER visits in previous 12 months Vitals Screenings to include cognitive, depression, and falls Referrals and appointments  In addition, I have reviewed and discussed with patient certain preventive protocols, quality metrics, and best practice recommendations. A written personalized care plan for preventive services as well as general preventive health recommendations were provided to patient.     Caesar Bookman, NP   10/21/2023   After Visit Summary: (In Person-Printed) AVS printed and given to the patient  Nurse Notes: Advised to update COVID-19 vaccine at the pharmacy

## 2023-10-21 NOTE — Patient Instructions (Signed)
 Sheri Adkins , Thank you for taking time to come for your Medicare Wellness Visit. I appreciate your ongoing commitment to your health goals. Please review the following plan we discussed and let me know if I can assist you in the future.   Screening recommendations/referrals: Colonoscopy : Due  Mammogram : Up to date  Bone Density  : Up to date  Recommended yearly ophthalmology/optometry visit for glaucoma screening and checkup Recommended yearly dental visit for hygiene and checkup  Vaccinations: Influenza vaccine- due annually in September/October Pneumococcal vaccine  : Up to date  Bone Density  : Up to date  Tdap vaccine  : Up to date  Shingles vaccine  : Up to date   Advanced directives: No   Conditions/risks identified: advanced age (>91men, >11 women);dyslipidemia  Next appointment: 1 year    Preventive Care 48 Years and Older, Female Preventive care refers to lifestyle choices and visits with your health care provider that can promote health and wellness. What does preventive care include? A yearly physical exam. This is also called an annual well check. Dental exams once or twice a year. Routine eye exams. Ask your health care provider how often you should have your eyes checked. Personal lifestyle choices, including: Daily care of your teeth and gums. Regular physical activity. Eating a healthy diet. Avoiding tobacco and drug use. Limiting alcohol use. Practicing safe sex. Taking low-dose aspirin every day. Taking vitamin and mineral supplements as recommended by your health care provider. What happens during an annual well check? The services and screenings done by your health care provider during your annual well check will depend on your age, overall health, lifestyle risk factors, and family history of disease. Counseling  Your health care provider may ask you questions about your: Alcohol use. Tobacco use. Drug use. Emotional well-being. Home and  relationship well-being. Sexual activity. Eating habits. History of falls. Memory and ability to understand (cognition). Work and work Astronomer. Reproductive health. Screening  You may have the following tests or measurements: Height, weight, and BMI. Blood pressure. Lipid and cholesterol levels. These may be checked every 5 years, or more frequently if you are over 27 years old. Skin check. Lung cancer screening. You may have this screening every year starting at age 66 if you have a 30-pack-year history of smoking and currently smoke or have quit within the past 15 years. Fecal occult blood test (FOBT) of the stool. You may have this test every year starting at age 39. Flexible sigmoidoscopy or colonoscopy. You may have a sigmoidoscopy every 5 years or a colonoscopy every 10 years starting at age 47. Hepatitis C blood test. Hepatitis B blood test. Sexually transmitted disease (STD) testing. Diabetes screening. This is done by checking your blood sugar (glucose) after you have not eaten for a while (fasting). You may have this done every 1-3 years. Bone density scan. This is done to screen for osteoporosis. You may have this done starting at age 59. Mammogram. This may be done every 1-2 years. Talk to your health care provider about how often you should have regular mammograms. Talk with your health care provider about your test results, treatment options, and if necessary, the need for more tests. Vaccines  Your health care provider may recommend certain vaccines, such as: Influenza vaccine. This is recommended every year. Tetanus, diphtheria, and acellular pertussis (Tdap, Td) vaccine. You may need a Td booster every 10 years. Zoster vaccine. You may need this after age 8. Pneumococcal 13-valent conjugate (PCV13)  vaccine. One dose is recommended after age 86. Pneumococcal polysaccharide (PPSV23) vaccine. One dose is recommended after age 47. Talk to your health care provider  about which screenings and vaccines you need and how often you need them. This information is not intended to replace advice given to you by your health care provider. Make sure you discuss any questions you have with your health care provider. Document Released: 07/28/2015 Document Revised: 03/20/2016 Document Reviewed: 05/02/2015 Elsevier Interactive Patient Education  2017 [employer].  Fall Prevention in the Home Falls can cause injuries. They can happen to people of all ages. There are many things you can do to make your home safe and to help prevent falls. What can I do on the outside of my home? Regularly fix the edges of walkways and driveways and fix any cracks. Remove anything that might make you trip as you walk through a door, such as a raised step or threshold. Trim any bushes or trees on the path to your home. Use bright outdoor lighting. Clear any walking paths of anything that might make someone trip, such as rocks or tools. Regularly check to see if handrails are loose or broken. Make sure that both sides of any steps have handrails. Any raised decks and porches should have guardrails on the edges. Have any leaves, snow, or ice cleared regularly. Use sand or salt on walking paths during winter. Clean up any spills in your garage right away. This includes oil or grease spills. What can I do in the bathroom? Use night lights. Install grab bars by the toilet and in the tub and shower. Do not use towel bars as grab bars. Use non-skid mats or decals in the tub or shower. If you need to sit down in the shower, use a plastic, non-slip stool. Keep the floor dry. Clean up any water that spills on the floor as soon as it happens. Remove soap buildup in the tub or shower regularly. Attach bath mats securely with double-sided non-slip rug tape. Do not have throw rugs and other things on the floor that can make you trip. What can I do in the bedroom? Use night lights. Make sure  that you have a light by your bed that is easy to reach. Do not use any sheets or blankets that are too big for your bed. They should not hang down onto the floor. Have a firm chair that has side arms. You can use this for support while you get dressed. Do not have throw rugs and other things on the floor that can make you trip. What can I do in the kitchen? Clean up any spills right away. Avoid walking on wet floors. Keep items that you use a lot in easy-to-reach places. If you need to reach something above you, use a strong step stool that has a grab bar. Keep electrical cords out of the way. Do not use floor polish or wax that makes floors slippery. If you must use wax, use non-skid floor wax. Do not have throw rugs and other things on the floor that can make you trip. What can I do with my stairs? Do not leave any items on the stairs. Make sure that there are handrails on both sides of the stairs and use them. Fix handrails that are broken or loose. Make sure that handrails are as long as the stairways. Check any carpeting to make sure that it is firmly attached to the stairs. Fix any carpet that is loose  or worn. Avoid having throw rugs at the top or bottom of the stairs. If you do have throw rugs, attach them to the floor with carpet tape. Make sure that you have a light switch at the top of the stairs and the bottom of the stairs. If you do not have them, ask someone to add them for you. What else can I do to help prevent falls? Wear shoes that: Do not have high heels. Have rubber bottoms. Are comfortable and fit you well. Are closed at the toe. Do not wear sandals. If you use a stepladder: Make sure that it is fully opened. Do not climb a closed stepladder. Make sure that both sides of the stepladder are locked into place. Ask someone to hold it for you, if possible. Clearly mark and make sure that you can see: Any grab bars or handrails. First and last steps. Where the edge of  each step is. Use tools that help you move around (mobility aids) if they are needed. These include: Canes. Walkers. Scooters. Crutches. Turn on the lights when you go into a dark area. Replace any light bulbs as soon as they burn out. Set up your furniture so you have a clear path. Avoid moving your furniture around. If any of your floors are uneven, fix them. If there are any pets around you, be aware of where they are. Review your medicines with your doctor. Some medicines can make you feel dizzy. This can increase your chance of falling. Ask your doctor what other things that you can do to help prevent falls. This information is not intended to replace advice given to you by your health care provider. Make sure you discuss any questions you have with your health care provider. Document Released: 04/27/2009 Document Revised: 12/07/2015 Document Reviewed: 08/05/2014 Elsevier Interactive Patient Education  2017 [employer].

## 2023-12-17 ENCOUNTER — Other Ambulatory Visit: Payer: Self-pay

## 2023-12-17 DIAGNOSIS — F3342 Major depressive disorder, recurrent, in full remission: Secondary | ICD-10-CM

## 2023-12-17 DIAGNOSIS — E039 Hypothyroidism, unspecified: Secondary | ICD-10-CM

## 2023-12-17 DIAGNOSIS — R0981 Nasal congestion: Secondary | ICD-10-CM

## 2023-12-17 DIAGNOSIS — E782 Mixed hyperlipidemia: Secondary | ICD-10-CM

## 2023-12-17 DIAGNOSIS — G43809 Other migraine, not intractable, without status migrainosus: Secondary | ICD-10-CM

## 2023-12-17 MED ORDER — FLUTICASONE PROPIONATE 50 MCG/ACT NA SUSP
2.0000 | Freq: Every day | NASAL | 1 refills | Status: DC
Start: 2023-12-17 — End: 2023-12-24

## 2023-12-17 MED ORDER — SUMATRIPTAN SUCCINATE 25 MG PO TABS: 25.0000 mg | ORAL_TABLET | ORAL | 0 refills | Status: DC | PRN

## 2023-12-17 MED ORDER — ATORVASTATIN CALCIUM 10 MG PO TABS
10.0000 mg | ORAL_TABLET | Freq: Every day | ORAL | 1 refills | Status: DC
Start: 2023-12-17 — End: 2023-12-24

## 2023-12-17 MED ORDER — PAROXETINE HCL 40 MG PO TABS: 40.0000 mg | ORAL_TABLET | Freq: Every day | ORAL | 1 refills | Status: DC

## 2023-12-17 MED ORDER — LEVOTHYROXINE SODIUM 75 MCG PO TABS: 75.0000 ug | ORAL_TABLET | Freq: Every day | ORAL | 1 refills | Status: DC

## 2023-12-17 NOTE — Telephone Encounter (Signed)
 Rx request was received  through [employer]. RX was sent to [facility]. High Risk Warning Populated when attempting to refill, I will send to Provider for further review   Message sent to Ngetich, Dinah C, NP

## 2023-12-24 ENCOUNTER — Other Ambulatory Visit: Payer: Self-pay

## 2023-12-24 ENCOUNTER — Other Ambulatory Visit: Payer: Self-pay | Admitting: Family

## 2023-12-24 DIAGNOSIS — R0981 Nasal congestion: Secondary | ICD-10-CM

## 2023-12-24 DIAGNOSIS — G43809 Other migraine, not intractable, without status migrainosus: Secondary | ICD-10-CM

## 2023-12-24 DIAGNOSIS — E039 Hypothyroidism, unspecified: Secondary | ICD-10-CM

## 2023-12-24 DIAGNOSIS — F3342 Major depressive disorder, recurrent, in full remission: Secondary | ICD-10-CM

## 2023-12-24 DIAGNOSIS — E782 Mixed hyperlipidemia: Secondary | ICD-10-CM

## 2023-12-24 MED ORDER — PAROXETINE HCL 40 MG PO TABS: 40.0000 mg | ORAL_TABLET | Freq: Every day | ORAL | 1 refills | Status: DC

## 2023-12-24 MED ORDER — FLUTICASONE PROPIONATE 50 MCG/ACT NA SUSP: 2.0000 | Freq: Every day | NASAL | 1 refills | Status: DC

## 2023-12-24 MED ORDER — ATORVASTATIN CALCIUM 10 MG PO TABS: 10.0000 mg | ORAL_TABLET | Freq: Every day | ORAL | 1 refills | Status: DC

## 2023-12-24 MED ORDER — LEVOTHYROXINE SODIUM 75 MCG PO TABS: 75.0000 ug | ORAL_TABLET | Freq: Every day | ORAL | 1 refills | Status: DC

## 2023-12-24 MED ORDER — SUMATRIPTAN SUCCINATE 25 MG PO TABS: 25.0000 mg | ORAL_TABLET | ORAL | 1 refills | Status: DC | PRN

## 2023-12-24 NOTE — Addendum Note (Signed)
 Addended by: Fredric Jean on: 12/24/2023 01:18 PM   Modules accepted: Orders

## 2023-12-24 NOTE — Telephone Encounter (Signed)
 Spoke with patient and confirmed that she is currently using [facility]

## 2023-12-24 NOTE — Telephone Encounter (Signed)
 Copied from CRM [id number]. Topic: Clinical - Medication Refill >> Dec 24, 2023 11:44 AM Blair Bumpers wrote: Medication: SUMAtriptan  (IMITREX ) 25 MG tablet  Has the patient contacted their pharmacy? No (Agent: If no, request that the patient contact the pharmacy for the refill. If patient does not wish to contact the pharmacy document the reason why and proceed with request.) (Agent: If yes, when and what did the pharmacy advise?)  This is the patient's preferred pharmacy:   [id number] Jonette Nestle, [place] - [address] AT [facility] [address] [place] [address] Phone: [phone] Fax: [phone]  Is this the correct pharmacy for this prescription? Yes If no, delete pharmacy and type the correct one.   Has the prescription been filled recently? Yes  Is the patient out of the medication? Yes  Has the patient been seen for an appointment in the last year OR does the patient have an upcoming appointment? Yes  Can we respond through MyChart? Yes  Agent: Please be advised that Rx refills may take up to 3 business days. We ask that you follow-up with your pharmacy.

## 2023-12-30 ENCOUNTER — Telehealth

## 2023-12-30 NOTE — Telephone Encounter (Signed)
 Incoming fax received from patients pharmacy to initiate a prior authorization for                   .  PA initiated through covermymeds. Key: [id number]  Awaiting reply from the insurance company which will be determined in 48-72 hours.

## 2023-12-31 NOTE — Telephone Encounter (Signed)
 Approval letter received (see media) indicating medication approved through 06/2024.   I called patients pharmacy and left a detailed voicemail notifying them of prior authorization approval.

## 2024-02-16 ENCOUNTER — Other Ambulatory Visit

## 2024-02-16 DIAGNOSIS — R7989 Other specified abnormal findings of blood chemistry: Secondary | ICD-10-CM

## 2024-02-16 LAB — HEPATIC FUNCTION PANEL
AG Ratio: 2.3 (calc) (ref 1.0–2.5)
ALT: 25 U/L (ref 6–29)
AST: 25 U/L (ref 10–35)
Albumin: 4.3 g/dL (ref 3.6–5.1)
Alkaline phosphatase (APISO): 63 U/L (ref 37–153)
Bilirubin, Direct: 0.1 mg/dL (ref 0.0–0.2)
Globulin: 1.9 g/dL (ref 1.9–3.7)
Indirect Bilirubin: 0.5 mg/dL (ref 0.2–1.2)
Total Bilirubin: 0.6 mg/dL (ref 0.2–1.2)
Total Protein: 6.2 g/dL (ref 6.1–8.1)

## 2024-02-20 ENCOUNTER — Ambulatory Visit: Payer: Self-pay | Admitting: Family

## 2024-03-16 ENCOUNTER — Other Ambulatory Visit

## 2024-03-16 DIAGNOSIS — F3342 Major depressive disorder, recurrent, in full remission: Secondary | ICD-10-CM

## 2024-03-16 DIAGNOSIS — G43809 Other migraine, not intractable, without status migrainosus: Secondary | ICD-10-CM

## 2024-03-16 DIAGNOSIS — E782 Mixed hyperlipidemia: Secondary | ICD-10-CM

## 2024-03-16 DIAGNOSIS — E039 Hypothyroidism, unspecified: Secondary | ICD-10-CM

## 2024-03-17 ENCOUNTER — Ambulatory Visit: Payer: Self-pay | Admitting: Family

## 2024-03-17 LAB — COMPLETE METABOLIC PANEL WITHOUT GFR
AG Ratio: 2.3 (calc) (ref 1.0–2.5)
ALT: 23 U/L (ref 6–29)
AST: 25 U/L (ref 10–35)
Albumin: 4.4 g/dL (ref 3.6–5.1)
Alkaline phosphatase (APISO): 61 U/L (ref 37–153)
BUN: 7 mg/dL (ref 7–25)
CO2: 28 mmol/L (ref 20–32)
Calcium: 9.3 mg/dL (ref 8.6–10.4)
Chloride: 100 mmol/L (ref 98–110)
Creat: 0.67 mg/dL (ref 0.50–1.05)
Globulin: 1.9 g/dL (ref 1.9–3.7)
Glucose, Bld: 91 mg/dL (ref 65–99)
Potassium: 4.1 mmol/L (ref 3.5–5.3)
Sodium: 136 mmol/L (ref 135–146)
Total Bilirubin: 0.7 mg/dL (ref 0.2–1.2)
Total Protein: 6.3 g/dL (ref 6.1–8.1)

## 2024-03-17 LAB — CBC WITH DIFFERENTIAL/PLATELET
Absolute Lymphocytes: 1931 {cells}/uL (ref 850–3900)
Absolute Monocytes: 441 {cells}/uL (ref 200–950)
Basophils Absolute: 41 {cells}/uL (ref 0–200)
Basophils Relative: 0.9 %
Eosinophils Absolute: 189 {cells}/uL (ref 15–500)
Eosinophils Relative: 4.2 %
HCT: 39.7 % (ref 35.0–45.0)
Hemoglobin: 13.2 g/dL (ref 11.7–15.5)
MCH: 32 pg (ref 27.0–33.0)
MCHC: 33.2 g/dL (ref 32.0–36.0)
MCV: 96.4 fL (ref 80.0–100.0)
MPV: 10.2 fL (ref 7.5–12.5)
Monocytes Relative: 9.8 %
Neutro Abs: 1899 {cells}/uL (ref 1500–7800)
Neutrophils Relative %: 42.2 %
Platelets: 239 Thousand/uL (ref 140–400)
RBC: 4.12 Million/uL (ref 3.80–5.10)
RDW: 11.8 % (ref 11.0–15.0)
Total Lymphocyte: 42.9 %
WBC: 4.5 Thousand/uL (ref 3.8–10.8)

## 2024-03-17 LAB — LIPID PANEL
Cholesterol: 164 mg/dL (ref <200)
HDL: 83 mg/dL (ref >=50)
LDL Cholesterol (Calc): 68 mg/dL
Non-HDL Cholesterol (Calc): 81 mg/dL (ref <130)
Total CHOL/HDL Ratio: 2 (calc) (ref <5.0)
Triglycerides: 54 mg/dL (ref <150)

## 2024-03-17 LAB — TSH: TSH: 1.01 m[IU]/L (ref 0.40–4.50)

## 2024-03-19 ENCOUNTER — Ambulatory Visit: Admitting: Family

## 2024-03-19 ENCOUNTER — Encounter: Payer: Self-pay | Admitting: Family

## 2024-03-19 VITALS — BP 116/74 | HR 83 | Temp 97.6°F | Resp 18 | Ht 60.0 in | Wt 136.8 lb

## 2024-03-19 DIAGNOSIS — F3342 Major depressive disorder, recurrent, in full remission: Secondary | ICD-10-CM

## 2024-03-19 DIAGNOSIS — E782 Mixed hyperlipidemia: Secondary | ICD-10-CM | POA: Diagnosis not present

## 2024-03-19 DIAGNOSIS — E039 Hypothyroidism, unspecified: Secondary | ICD-10-CM | POA: Diagnosis not present

## 2024-03-19 DIAGNOSIS — R0981 Nasal congestion: Secondary | ICD-10-CM

## 2024-03-19 DIAGNOSIS — Z23 Encounter for immunization: Secondary | ICD-10-CM | POA: Diagnosis not present

## 2024-03-19 DIAGNOSIS — G43809 Other migraine, not intractable, without status migrainosus: Secondary | ICD-10-CM

## 2024-03-19 MED ORDER — COVID-19 MRNA VAC-TRIS(PFIZER) 30 MCG/0.3ML IM SUSY: 0.3000 mL | PREFILLED_SYRINGE | Freq: Once | INTRAMUSCULAR | 0 refills | Status: AC

## 2024-03-19 NOTE — Patient Instructions (Signed)
 1.Go to local pharmacy to receive Covid vaccine. 2.Call about Colonoscopy.

## 2024-03-24 NOTE — Progress Notes (Signed)
 Provider: Roxan Plough FNP-C   Jermani Eberlein, Roxan BROCKS, NP  Patient Care Team: Von Quintanar, Roxan BROCKS, NP as PCP - General (Family Medicine)  Extended Emergency Contact Information Primary Emergency Contact: Speelman,Janet [address], [place] [address] [place] Mobile Phone: [phone] Relation: Sister  Code Status:  Full Code  Goals of care: Advanced Directive information    03/19/2024    9:44 AM  Advanced Directives  Does Patient Have a Medical Advance Directive? No  Would patient like information on creating a medical advance directive? No - Patient declined     Chief Complaint  Patient presents with   Medical Management of Chronic Issues    6 Month follow up & discuss covid vaccine & colonoscopy.Pt received Flu vaccine.    Discussed the use of AI scribe software for clinical note transcription with the patient, who gave verbal consent to proceed.  History of Present Illness   Sheri Adkins is a 68 year old female who presents for a six-month follow-up visit.  She experiences pain in her right wrist, which she attributes to prolonged computer use and playing pickleball. The pain began a few weeks ago and is particularly noticeable during pickleball. She has been using a wrist brace and performing exercises daily, which have provided some relief. No numbness in her fingers and no issues with her left hand.  She is currently taking a multivitamin, ibuprofen  as needed, calcium  citrate, vitamin D , Imitrex  for migraines, atorvastatin  10 mg daily for cholesterol, levothyroxine  75 mcg, and Paxil  40 mg daily. She mentions experiencing some depression and anxiety recently due to circumstances at her church but notes that these feelings are situational and improve when she engages in activities like pickleball.  She reports no issues with muscle pain or aches, which can be a side effect of atorvastatin .  She has been eating more fruits and trying to  incorporate more vegetables into her diet, which has improved her bowel movements. She prefers healthier foods over fried or sugary options.  She traveled to [place] over the summer with her sister and brother-in-law, where she stayed in a rented house with friends. She enjoyed the trip and the company.  No numbness in her fingers, constipation, diarrhea, or changes in vision.    Past Medical History:  Diagnosis Date   Allergy    Arthritis    Depression    H/O mammogram 2021   History of Papanicolaou smear of cervix 2020   Migraine    Thyroid  disease    History reviewed. No pertinent surgical history.  Allergies  Allergen Reactions   Penicillins Rash    Has patient had a PCN reaction causing immediate rash, facial/tongue/throat swelling, SOB or lightheadedness with hypotension: Yes  Has patient had a PCN reaction causing severe rash involving mucus membranes or skin necrosis: No  Has patient had a PCN reaction that required hospitalization No  Has patient had a PCN reaction occurring within the last 10 years: No  If all of the above answers are NO, then may proceed with Cephalosporin use.  Has patient had a PCN reaction causing immediate rash, facial/tongue/throat swelling, SOB or lightheadedness with hypotension: Yes Has patient had a PCN reaction causing severe rash involving mucus membranes or skin necrosis: No Has patient had a PCN reaction that required hospitalization No Has patient had a PCN reaction occurring within the last 10 years: No If all of the above answers are NO,  then may proceed with Cephalosporin use.   Sulfa Antibiotics Rash    Rash    Allergies as of 03/19/2024       Reactions   Penicillins Rash   Has patient had a PCN reaction causing immediate rash, facial/tongue/throat swelling, SOB or lightheadedness with hypotension: Yes Has patient had a PCN reaction causing severe rash involving mucus membranes or skin necrosis: No Has patient had a PCN  reaction that required hospitalization No Has patient had a PCN reaction occurring within the last 10 years: No If all of the above answers are NO, then may proceed with Cephalosporin use. Has patient had a PCN reaction causing immediate rash, facial/tongue/throat swelling, SOB or lightheadedness with hypotension: Yes Has patient had a PCN reaction causing severe rash involving mucus membranes or skin necrosis: No Has patient had a PCN reaction that required hospitalization No Has patient had a PCN reaction occurring within the last 10 years: No If all of the above answers are NO, then may proceed with Cephalosporin use.   Sulfa Antibiotics Rash   Rash        Medication List        Accurate as of March 19, 2024 11:59 PM. If you have any questions, ask your nurse or doctor.          atorvastatin  10 MG tablet Commonly known as: LIPITOR Take 1 tablet (10 mg total) by mouth daily.   Calcium  Citrate + D 250-5 MG-MCG Tabs Generic drug: Calcium  Citrate-Vitamin D  Take 1 tablet by mouth 2 (two) times daily.   COVID-19 mRNA vaccine (Pfizer) syringe Commonly known as: COMIRNATY Inject 0.3 mLs into the muscle once for 1 dose. Started by: Derick Seminara C Coleby Yett   fluticasone  50 MCG/ACT nasal spray Commonly known as: FLONASE  Place 2 sprays into both nostrils daily. SPRAY 2 SPRAYS INTO EACH NOSTRIL EVERY DAY   IBUPROFEN  PO Take 1 tablet by mouth as needed.   levothyroxine  75 MCG tablet Commonly known as: SYNTHROID  Take 1 tablet (75 mcg total) by mouth daily.   MULTIVITAMIN PO Take 1 capsule by mouth daily.   PARoxetine  40 MG tablet Commonly known as: PAXIL  Take 1 tablet (40 mg total) by mouth daily.   SUMAtriptan  25 MG tablet Commonly known as: IMITREX  Take 1 tablet (25 mg total) by mouth every 2 (two) hours as needed for migraine. May repeat in 2 hours if headache persists or recurs.        Review of Systems  Constitutional:  Negative for appetite change, chills,  fatigue, fever and unexpected weight change.  HENT:  Positive for rhinorrhea. Negative for congestion, dental problem, ear discharge, ear pain, facial swelling, hearing loss, nosebleeds, postnasal drip, sinus pressure, sinus pain, sneezing, sore throat, tinnitus and trouble swallowing.   Eyes:  Negative for pain, discharge, redness, itching and visual disturbance.  Respiratory:  Negative for cough, chest tightness, shortness of breath and wheezing.   Cardiovascular:  Negative for chest pain, palpitations and leg swelling.  Gastrointestinal:  Negative for abdominal distention, abdominal pain, blood in stool, constipation, diarrhea, nausea and vomiting.  Endocrine: Negative for cold intolerance, heat intolerance, polydipsia, polyphagia and polyuria.  Genitourinary:  Negative for difficulty urinating, dysuria, flank pain, frequency and urgency.  Musculoskeletal:  Positive for arthralgias. Negative for back pain, gait problem, joint swelling, myalgias, neck pain and neck stiffness.       Right wrist pain   Skin:  Negative for color change, pallor, rash and wound.  Neurological:  Positive for headaches. Negative  for dizziness, syncope, speech difficulty, weakness, light-headedness and numbness.  Hematological:  Does not bruise/bleed easily.  Psychiatric/Behavioral:  Negative for agitation, behavioral problems, confusion, hallucinations, self-injury, sleep disturbance and suicidal ideas. The patient is not nervous/anxious.     Immunization History  Administered Date(s) Administered   Fluad Quad(high Dose 65+) 04/01/2022   Fluad Trivalent(High Dose 65+) 03/19/2023   INFLUENZA, HIGH DOSE SEASONAL PF 03/19/2024   Influenza Inj Mdck Quad With Preservative 05/13/2018   Influenza,inj,Quad PF,6+ Mos 04/14/2017   Influenza-Unspecified 04/18/2014, 05/02/2015, 04/23/2020   PFIZER(Purple Top)SARS-COV-2 Vaccination 09/23/2019, 10/13/2019, 07/02/2020   PNEUMOCOCCAL CONJUGATE-20 09/17/2023   Pneumococcal  Conjugate-13 09/28/2021   Tdap 07/16/2011, 10/05/2021   Zoster Recombinant(Shingrix) 10/05/2021, 12/17/2021   Pertinent  Health Maintenance Due  Topic Date Due   Colonoscopy  Never done   Mammogram  10/22/2024   Influenza Vaccine  Completed   DEXA SCAN  Completed      10/17/2022    2:46 PM 03/19/2023    9:03 AM 09/17/2023    9:25 AM 10/21/2023   10:12 AM 03/19/2024    9:43 AM  Fall Risk  Falls in the past year? 1 0 0 0 0  Was there an injury with Fall? 1  0 0 0  Fall Risk Category Calculator 2  0 0 0  Patient at Risk for Falls Due to    No Fall Risks No Fall Risks  Fall risk Follow up Falls evaluation completed;Education provided;Falls prevention discussed   Falls evaluation completed Falls evaluation completed   Functional Status Survey:    Vitals:   03/19/24 0945  BP: 116/74  Pulse: 83  Resp: 18  Temp: 97.6 F (36.4 C)  Weight: 136 lb 12.8 oz (62.1 kg)  Height: 5' (1.524 m)   Body mass index is 26.72 kg/m. Physical Exam VITALS: T- 97.8, P- 83, BP- 16/74, SaO2- 98% MEASUREMENTS: Weight- 136.8. GENERAL: Alert, cooperative, well developed, no acute distress. HEENT: Normocephalic, normal oropharynx, moist mucous membranes, tympanic membranes normal bilaterally, nose normal, extraocular movements intact, no sinus tenderness. NECK: Thyroid  normal. CHEST: Clear to auscultation bilaterally, no wheezes, rhonchi, or crackles. CARDIOVASCULAR: Normal heart rate and rhythm, S1 and S2 normal without murmurs. ABDOMEN: Soft, non-tender, non-distended, without organomegaly, normal bowel sounds. EXTREMITIES: No cyanosis or edema. MUSCULOSKELETAL: No spinal tenderness, no knee swelling or tenderness, no leg swelling, hips non-tender with full range of motion. NEUROLOGICAL: Cranial nerves grossly intact, moves all extremities without gross motor or sensory deficit.  SKIN: No rash,no lesion or erythema   PSYCHIATRY/BEHAVIORAL: Mood stable    Labs reviewed: Recent Labs    09/12/23 0804  03/16/24 0809  NA 135 136  K 4.1 4.1  CL 100 100  CO2 30 28  GLUCOSE 85 91  BUN 9 7  CREATININE 0.59 0.67  CALCIUM  9.0 9.3   Recent Labs    09/12/23 0804 02/16/24 1030 03/16/24 0809  AST 33 25 25  ALT 38* 25 23  BILITOT 0.8 0.6 0.7  PROT 6.5 6.2 6.3   Recent Labs    09/12/23 0804 03/16/24 0809  WBC 4.3 4.5  NEUTROABS 1,398* 1,899  HGB 12.5 13.2  HCT 37.5 39.7  MCV 96.2 96.4  PLT 225 239   Lab Results  Component Value Date   TSH 1.01 03/16/2024   No results found for: HGBA1C Lab Results  Component Value Date   CHOL 164 03/16/2024   HDL 83 03/16/2024   LDLCALC 68 03/16/2024   LDLDIRECT 151 (H) 11/02/2013   TRIG 54  03/16/2024   CHOLHDL 2.0 03/16/2024    Significant Diagnostic Results in last 30 days:  No results found.  Assessment/Plan  Right wrist pain Right wrist pain likely due to overuse from computer mouse and playing pickleball. Symptoms improved with wrist brace and exercises. No numbness or symptoms in the left hand. - Advise continued use of wrist brace and exercises - Recommend avoiding heavy lifting with wrist - Consider referral to orthopedics if symptoms worsen for potential cortisone injection - Suggest use of anti-inflammatory medication with food if needed  Depression Depression symptoms related to situational stressors at church. No worsening of depression symptoms. Engages in activities like pickleball which improve mood. - Continue current dose of Paxil  40 mg daily - Encourage continued engagement in mood-lifting activities like pickleball  Migraine Migraine managed with Imitrex  as needed.  Hypercholesterolemia Hypercholesterolemia well-controlled with atorvastatin . Total cholesterol decreased from 196 to 164, HDL is 83, triglycerides are 54, and LDL decreased from 195 to 68. No muscle pain or aches reported. - Continue atorvastatin  10 mg daily - Recheck cholesterol levels in six months  Hypothyroidism Hypothyroidism  well-managed on current levothyroxine  dose. Thyroid  function tests are normal. - Continue levothyroxine  75 mcg daily  Allergic rhinitis Allergic rhinitis managed with Flonase  nasal spray. - Continue Flonase  50 mcg nasal spray   Family/ staff Communication: Reviewed plan of care with patient verbalized understanding   Labs/tests ordered:  - CBC with Differential/Platelet - CMP with eGFR([facility]) - TSH - Lipid panel   Next Appointment : Return in about 6 months (around 09/16/2024) for medical mangement of chronic issues., Fasting labs in 6 months prior to visit.   Spent 30 minutes of Face to face and non-face to face with patient  >50% time spent counseling; reviewing medical record; tests; labs; documentation and developing future plan of care.   Roxan JAYSON Plough, NP

## 2024-04-01 ENCOUNTER — Other Ambulatory Visit: Payer: Self-pay | Admitting: Family

## 2024-04-01 DIAGNOSIS — R0981 Nasal congestion: Secondary | ICD-10-CM

## 2024-04-01 DIAGNOSIS — F3342 Major depressive disorder, recurrent, in full remission: Secondary | ICD-10-CM

## 2024-04-01 DIAGNOSIS — E782 Mixed hyperlipidemia: Secondary | ICD-10-CM

## 2024-04-01 DIAGNOSIS — E039 Hypothyroidism, unspecified: Secondary | ICD-10-CM

## 2024-04-01 NOTE — Telephone Encounter (Signed)
 High risk warning populated when attempting to refill medication  Please review and approval if appropriate   Thanks,  [facility] W/CMA

## 2024-07-10 ENCOUNTER — Other Ambulatory Visit: Payer: Self-pay | Admitting: Family

## 2024-07-10 DIAGNOSIS — G43809 Other migraine, not intractable, without status migrainosus: Secondary | ICD-10-CM

## 2024-09-13 ENCOUNTER — Other Ambulatory Visit: Payer: Self-pay

## 2024-09-16 ENCOUNTER — Ambulatory Visit: Payer: Self-pay | Admitting: Family

## 2024-10-22 ENCOUNTER — Ambulatory Visit: Payer: Self-pay | Admitting: Family
